# Patient Record
Sex: Female | Born: 1975 | Race: White | Hispanic: No | Smoking: Former smoker
Health system: Southern US, Community
[De-identification: ages and names within clinical notes are randomized; demographics above are authoritative.]

## PROBLEM LIST (undated history)

## (undated) DIAGNOSIS — R42 Dizziness and giddiness: Secondary | ICD-10-CM

## (undated) DIAGNOSIS — I639 Cerebral infarction, unspecified: Secondary | ICD-10-CM

## (undated) DIAGNOSIS — F32A Depression, unspecified: Secondary | ICD-10-CM

## (undated) DIAGNOSIS — R569 Unspecified convulsions: Secondary | ICD-10-CM

## (undated) DIAGNOSIS — C801 Malignant (primary) neoplasm, unspecified: Secondary | ICD-10-CM

## (undated) DIAGNOSIS — F419 Anxiety disorder, unspecified: Secondary | ICD-10-CM

## (undated) DIAGNOSIS — K8020 Calculus of gallbladder without cholecystitis without obstruction: Secondary | ICD-10-CM

## (undated) DIAGNOSIS — F329 Major depressive disorder, single episode, unspecified: Secondary | ICD-10-CM

## (undated) HISTORY — PX: ABDOMINAL HYSTERECTOMY: SUR658

---

## 2015-10-22 ENCOUNTER — Emergency Department (HOSPITAL_COMMUNITY): Payer: Medicaid Other

## 2015-10-22 ENCOUNTER — Encounter (HOSPITAL_COMMUNITY): Payer: Self-pay | Admitting: Emergency Medicine

## 2015-10-22 DIAGNOSIS — R1110 Vomiting, unspecified: Secondary | ICD-10-CM | POA: Diagnosis not present

## 2015-10-22 DIAGNOSIS — R1031 Right lower quadrant pain: Secondary | ICD-10-CM | POA: Diagnosis not present

## 2015-10-22 DIAGNOSIS — F17200 Nicotine dependence, unspecified, uncomplicated: Secondary | ICD-10-CM | POA: Diagnosis not present

## 2015-10-22 DIAGNOSIS — R0602 Shortness of breath: Secondary | ICD-10-CM | POA: Insufficient documentation

## 2015-10-22 DIAGNOSIS — R61 Generalized hyperhidrosis: Secondary | ICD-10-CM | POA: Insufficient documentation

## 2015-10-22 DIAGNOSIS — R079 Chest pain, unspecified: Secondary | ICD-10-CM | POA: Insufficient documentation

## 2015-10-22 LAB — URINALYSIS, ROUTINE W REFLEX MICROSCOPIC
Bilirubin Urine: NEGATIVE
Glucose, UA: NEGATIVE mg/dL
Hgb urine dipstick: NEGATIVE
Ketones, ur: NEGATIVE mg/dL
LEUKOCYTES UA: NEGATIVE
NITRITE: NEGATIVE
PROTEIN: NEGATIVE mg/dL
Specific Gravity, Urine: 1.026 (ref 1.005–1.030)
pH: 6.5 (ref 5.0–8.0)

## 2015-10-22 LAB — BASIC METABOLIC PANEL
ANION GAP: 13 (ref 5–15)
BUN: 10 mg/dL (ref 6–20)
CO2: 23 mmol/L (ref 22–32)
Calcium: 8.7 mg/dL — ABNORMAL LOW (ref 8.9–10.3)
Chloride: 94 mmol/L — ABNORMAL LOW (ref 101–111)
Creatinine, Ser: 0.69 mg/dL (ref 0.44–1.00)
Glucose, Bld: 102 mg/dL — ABNORMAL HIGH (ref 65–99)
Potassium: 3.6 mmol/L (ref 3.5–5.1)
Sodium: 130 mmol/L — ABNORMAL LOW (ref 135–145)

## 2015-10-22 LAB — I-STAT TROPONIN, ED: Troponin i, poc: 0 ng/mL (ref 0.00–0.08)

## 2015-10-22 LAB — CBC
HCT: 43 % (ref 36.0–46.0)
HEMOGLOBIN: 14.4 g/dL (ref 12.0–15.0)
MCH: 31.4 pg (ref 26.0–34.0)
MCHC: 33.5 g/dL (ref 30.0–36.0)
MCV: 93.7 fL (ref 78.0–100.0)
Platelets: 248 10*3/uL (ref 150–400)
RBC: 4.59 MIL/uL (ref 3.87–5.11)
RDW: 13.8 % (ref 11.5–15.5)
WBC: 10.8 10*3/uL — AB (ref 4.0–10.5)

## 2015-10-22 NOTE — ED Notes (Signed)
Pt. reports central chest pain with mild SOB , emesis and diaphoresis onset 3 days ago , pt. added right lower abdominal pain onset 2 days ago .

## 2015-10-23 ENCOUNTER — Emergency Department (HOSPITAL_COMMUNITY)
Admission: EM | Admit: 2015-10-23 | Discharge: 2015-10-23 | Disposition: A | Discharge status: Home / Self Care | Payer: Medicaid Other | Attending: Emergency Medicine | Admitting: Emergency Medicine

## 2015-10-23 HISTORY — DX: Malignant (primary) neoplasm, unspecified: C80.1

## 2017-03-09 ENCOUNTER — Emergency Department (HOSPITAL_COMMUNITY)
Admission: EM | Admit: 2017-03-09 | Discharge: 2017-03-09 | Disposition: A | Discharge status: Home / Self Care | Payer: Self-pay | Attending: Emergency Medicine | Admitting: Emergency Medicine

## 2017-03-09 ENCOUNTER — Emergency Department (HOSPITAL_COMMUNITY): Payer: Self-pay

## 2017-03-09 ENCOUNTER — Encounter (HOSPITAL_COMMUNITY): Payer: Self-pay

## 2017-03-09 DIAGNOSIS — K625 Hemorrhage of anus and rectum: Secondary | ICD-10-CM | POA: Insufficient documentation

## 2017-03-09 DIAGNOSIS — G44301 Post-traumatic headache, unspecified, intractable: Secondary | ICD-10-CM | POA: Insufficient documentation

## 2017-03-09 DIAGNOSIS — R1032 Left lower quadrant pain: Secondary | ICD-10-CM | POA: Insufficient documentation

## 2017-03-09 DIAGNOSIS — F0781 Postconcussional syndrome: Secondary | ICD-10-CM | POA: Insufficient documentation

## 2017-03-09 DIAGNOSIS — K649 Unspecified hemorrhoids: Secondary | ICD-10-CM | POA: Insufficient documentation

## 2017-03-09 DIAGNOSIS — Z79899 Other long term (current) drug therapy: Secondary | ICD-10-CM | POA: Insufficient documentation

## 2017-03-09 DIAGNOSIS — R112 Nausea with vomiting, unspecified: Secondary | ICD-10-CM | POA: Insufficient documentation

## 2017-03-09 HISTORY — DX: Dizziness and giddiness: R42

## 2017-03-09 HISTORY — DX: Unspecified convulsions: R56.9

## 2017-03-09 HISTORY — DX: Calculus of gallbladder without cholecystitis without obstruction: K80.20

## 2017-03-09 HISTORY — DX: Depression, unspecified: F32.A

## 2017-03-09 HISTORY — DX: Major depressive disorder, single episode, unspecified: F32.9

## 2017-03-09 LAB — COMPREHENSIVE METABOLIC PANEL
ALK PHOS: 68 U/L (ref 38–126)
ALT: 13 U/L — AB (ref 14–54)
AST: 16 U/L (ref 15–41)
Albumin: 3.6 g/dL (ref 3.5–5.0)
Anion gap: 8 (ref 5–15)
BILIRUBIN TOTAL: 0.1 mg/dL — AB (ref 0.3–1.2)
CHLORIDE: 105 mmol/L (ref 101–111)
CO2: 27 mmol/L (ref 22–32)
CREATININE: 0.75 mg/dL (ref 0.44–1.00)
Calcium: 8.9 mg/dL (ref 8.9–10.3)
GFR calc Af Amer: >60 mL/min (ref >60)
Glucose, Bld: 92 mg/dL (ref 65–99)
Potassium: 3.5 mmol/L (ref 3.5–5.1)
Sodium: 140 mmol/L (ref 135–145)
Total Protein: 5.9 g/dL — ABNORMAL LOW (ref 6.5–8.1)

## 2017-03-09 LAB — CBC WITH DIFFERENTIAL/PLATELET
BASOS ABS: 0 10*3/uL (ref 0.0–0.1)
Basophils Relative: 0 %
Eosinophils Absolute: 0.1 10*3/uL (ref 0.0–0.7)
Eosinophils Relative: 1 %
HEMATOCRIT: 39 % (ref 36.0–46.0)
HEMOGLOBIN: 13 g/dL (ref 12.0–15.0)
LYMPHS PCT: 22 %
Lymphs Abs: 1.9 10*3/uL (ref 0.7–4.0)
MCH: 32.7 pg (ref 26.0–34.0)
MCHC: 33.3 g/dL (ref 30.0–36.0)
MCV: 98 fL (ref 78.0–100.0)
Monocytes Absolute: 0.7 10*3/uL (ref 0.1–1.0)
Monocytes Relative: 8 %
NEUTROS ABS: 5.9 10*3/uL (ref 1.7–7.7)
NEUTROS PCT: 69 %
PLATELETS: 141 10*3/uL — AB (ref 150–400)
RBC: 3.98 MIL/uL (ref 3.87–5.11)
RDW: 13.5 % (ref 11.5–15.5)
WBC: 8.6 10*3/uL (ref 4.0–10.5)

## 2017-03-09 LAB — URINALYSIS, ROUTINE W REFLEX MICROSCOPIC
Bilirubin Urine: NEGATIVE
GLUCOSE, UA: NEGATIVE mg/dL
HGB URINE DIPSTICK: NEGATIVE
Ketones, ur: NEGATIVE mg/dL
Leukocytes, UA: NEGATIVE
Nitrite: NEGATIVE
PROTEIN: NEGATIVE mg/dL
Specific Gravity, Urine: 1.003 — ABNORMAL LOW (ref 1.005–1.030)
pH: 6 (ref 5.0–8.0)

## 2017-03-09 LAB — LIPASE, BLOOD: LIPASE: 22 U/L (ref 11–51)

## 2017-03-09 LAB — I-STAT BETA HCG BLOOD, ED (MC, WL, AP ONLY)

## 2017-03-09 MED ORDER — SODIUM CHLORIDE 0.9 % IV BOLUS (SEPSIS)
1000.0000 mL | Freq: Once | INTRAVENOUS | Status: AC
  Administered 2017-03-09: 1000 mL via INTRAVENOUS

## 2017-03-09 MED ORDER — PROMETHAZINE HCL 25 MG PO TABS: 25.0000 mg | ORAL_TABLET | Freq: Four times a day (QID) | ORAL | 0 refills | Status: DC | PRN

## 2017-03-09 MED ORDER — DICYCLOMINE HCL 20 MG PO TABS: 20.0000 mg | ORAL_TABLET | Freq: Two times a day (BID) | ORAL | 0 refills | Status: DC

## 2017-03-09 MED ORDER — DEXAMETHASONE SODIUM PHOSPHATE 10 MG/ML IJ SOLN
10.0000 mg | Freq: Once | INTRAMUSCULAR | Status: AC
  Administered 2017-03-09: 10 mg via INTRAVENOUS
  Filled 2017-03-09: qty 1

## 2017-03-09 MED ORDER — DIPHENHYDRAMINE HCL 50 MG/ML IJ SOLN
25.0000 mg | Freq: Once | INTRAMUSCULAR | Status: AC
  Administered 2017-03-09: 25 mg via INTRAVENOUS
  Filled 2017-03-09: qty 1

## 2017-03-09 MED ORDER — DICYCLOMINE HCL 10 MG/ML IM SOLN
20.0000 mg | Freq: Once | INTRAMUSCULAR | Status: AC
  Administered 2017-03-09: 20 mg via INTRAMUSCULAR
  Filled 2017-03-09: qty 2

## 2017-03-09 MED ORDER — ONDANSETRON HCL 4 MG/2ML IJ SOLN
4.0000 mg | Freq: Once | INTRAMUSCULAR | Status: AC
  Administered 2017-03-09: 4 mg via INTRAVENOUS
  Filled 2017-03-09: qty 2

## 2017-03-09 MED ORDER — MAGNESIUM SULFATE 2 GM/50ML IV SOLN
2.0000 g | Freq: Once | INTRAVENOUS | Status: AC
  Administered 2017-03-09: 2 g via INTRAVENOUS
  Filled 2017-03-09: qty 50

## 2017-03-09 MED ORDER — IOPAMIDOL (ISOVUE-300) INJECTION 61%
INTRAVENOUS | Status: AC
  Administered 2017-03-09: 100 mL
  Filled 2017-03-09: qty 100

## 2017-03-09 MED ORDER — IOPAMIDOL (ISOVUE-300) INJECTION 61%
INTRAVENOUS | Status: AC
  Administered 2017-03-09: 30 mL
  Filled 2017-03-09: qty 30

## 2017-03-09 MED ORDER — KETOROLAC TROMETHAMINE 15 MG/ML IJ SOLN
15.0000 mg | Freq: Once | INTRAMUSCULAR | Status: AC
  Administered 2017-03-09: 15 mg via INTRAVENOUS
  Filled 2017-03-09: qty 1

## 2017-03-09 MED ORDER — PROCHLORPERAZINE EDISYLATE 5 MG/ML IJ SOLN
10.0000 mg | Freq: Once | INTRAMUSCULAR | Status: AC
  Administered 2017-03-09: 10 mg via INTRAVENOUS
  Filled 2017-03-09: qty 2

## 2017-03-09 NOTE — ED Provider Notes (Signed)
[place] DEPT Provider Note   CSN: [id number] Arrival date & time: 03/09/17  1015     History   Chief Complaint Chief Complaint  Patient presents with  . Abdominal Pain    HPI Madeline Shaw is a 41 y.o. female.  HPI   Abdominal pain started on Wednesday, left lower quadrant and radiates to the back. Feels like "braxton hicks"  Reports urinary frequency every 10-15 minutes. Hematuria since Monday, no dysuria. Pain worse with eating and sitting up. Better in fetal position.  Over last few days has also started passing mucous blood clots in stool, had 3 episodes of it. had something similar 42mos ago but did not follow up with colonoscopy.  11lb weight loss over last week but also reports not eating.  Fell on Saturday, was seen at [facility], had CT done at [facility] which was negative. Reports since fall she has had headache, nausea, and vomiting.  Uses goody powders but has not in last week.   Past Medical History:  Diagnosis Date  . Cancer ([place])   . Depression   . Gall stones   . Seizures ([place])   . Vertigo     There are no active problems to display for this patient.   Past Surgical History:  Procedure Laterality Date  . ABDOMINAL HYSTERECTOMY      OB History    No data available       Home Medications    Prior to Admission medications   Medication Sig Start Date End Date Taking? Authorizing Provider  albuterol (PROVENTIL HFA;VENTOLIN HFA) 108 (90 Base) MCG/ACT inhaler Inhale 2 puffs into the lungs 2 (two) times daily.   Yes [provider]  albuterol (PROVENTIL) (2.5 MG/3ML) 0.083% nebulizer solution Take 2.5 mg by nebulization every 6 (six) hours as needed for wheezing or shortness of breath.   Yes [provider]  Aspirin-Acetaminophen-Caffeine (GOODY HEADACHE PO) Take 1 packet by mouth every hour as needed (pain/headache).   Yes [provider]  DEXAMETHASONE PO Take by mouth. Tapered course   Yes [provider]  diphenhydrAMINE (BENADRYL) 25 MG tablet Take 25 mg by mouth 3 (three) times daily as needed for itching or allergies.   Yes [provider]  estradiol (EVAMIST) 1.53 MG/SPRAY transdermal spray Place 1 spray onto the skin daily.   Yes [provider]  lamoTRIgine (LAMICTAL) 100 MG tablet Take 200 mg by mouth every 12 (twelve) hours.   Yes [provider]  LORazepam (ATIVAN) 1 MG tablet Take 1 mg by mouth every 6 (six) hours as needed for anxiety.  12/19/16  Yes [provider]  meclizine (ANTIVERT) 25 MG tablet Take 25 mg by mouth 3 (three) times daily as needed for dizziness.  12/19/16  Yes [provider]  ondansetron (ZOFRAN-ODT) 4 MG disintegrating tablet Take 4 mg by mouth every 8 (eight) hours as needed for nausea or vomiting.   Yes [provider]    Family History No family history on file.  Social History Social History  Substance Use Topics  . Smoking status: Current Every Day Smoker    Packs/day: 0.50    Types: Cigarettes  . Smokeless tobacco: Never Used  . Alcohol use Yes     Allergies   Imitrex [sumatriptan]; Chlorpheniramine-dm; and Triaminic fever reducer [acetaminophen]   Review of Systems Review of Systems  Constitutional: Positive for appetite change. Negative for fever.  HENT: Negative for sore throat.   Eyes: Negative for visual  disturbance.  Respiratory: Negative for cough and shortness of breath.   Cardiovascular: Negative for chest pain.  Gastrointestinal: Positive for abdominal pain, anal bleeding, blood in stool, nausea and vomiting. Negative for constipation (alterenating at baseline) and diarrhea.  Genitourinary: Positive for frequency and hematuria. Negative for difficulty urinating, vaginal bleeding and vaginal discharge.  Musculoskeletal: Negative for back pain and neck pain.  Skin: Negative for rash.  Neurological: Positive for headaches. Negative for syncope.     Physical Exam Updated  Vital Signs BP 112/79   Pulse 68   Temp 98.2 F (36.8 C) (Oral)   Resp 16   Ht 5\' 3"  (1.6 m)   Wt 53.1 kg (117 lb)   SpO2 98%   BMI 20.73 kg/m   Physical Exam  Constitutional: She is oriented to person, place, and time. She appears well-developed and well-nourished. No distress.  HENT:  Head: Normocephalic and atraumatic.  Eyes: Conjunctivae and EOM are normal.  Neck: Normal range of motion.  Cardiovascular: Normal rate, regular rhythm, normal heart sounds and intact distal pulses.  Exam reveals no gallop and no friction rub.   No murmur heard. Pulmonary/Chest: Effort normal and breath sounds normal. No respiratory distress. She has no wheezes. She has no rales.  Abdominal: Soft. She exhibits no distension. There is tenderness. There is guarding.  Musculoskeletal: She exhibits no edema or tenderness.  Neurological: She is alert and oriented to person, place, and time.  Skin: Skin is warm and dry. No rash noted. She is not diaphoretic. No erythema.  Nursing note and vitals reviewed.    ED Treatments / Results  Labs (all labs ordered are listed, but only abnormal results are displayed) Labs Reviewed  CBC WITH DIFFERENTIAL/PLATELET - Abnormal; Notable for the following:       Result Value   Platelets 141 (*)    All other components within normal limits  COMPREHENSIVE METABOLIC PANEL - Abnormal; Notable for the following:    BUN <5 (*)    Total Protein 5.9 (*)    ALT 13 (*)    Total Bilirubin 0.1 (*)    All other components within normal limits  URINALYSIS, ROUTINE W REFLEX MICROSCOPIC - Abnormal; Notable for the following:    Color, Urine STRAW (*)    Specific Gravity, Urine 1.003 (*)    All other components within normal limits  URINE CULTURE  LIPASE, BLOOD  I-STAT BETA HCG BLOOD, ED ([facility], [facility], [facility] ONLY)    EKG  EKG Interpretation None       Radiology Ct Abdomen Pelvis W Contrast  Result Date: 03/09/2017 CLINICAL DATA:  Abdominal pain with gastrointestinal  bleeding. Vomiting and nausea for 3 days. EXAM: CT ABDOMEN AND PELVIS WITH CONTRAST TECHNIQUE: Multidetector CT imaging of the abdomen and pelvis was performed using the standard protocol following bolus administration of intravenous contrast. CONTRAST:  134mL ISOVUE-300 IOPAMIDOL (ISOVUE-300) INJECTION 61% COMPARISON:  None. FINDINGS: Lower chest: 0.6 by 0.5 by 0.5 cm subpleural nodule in the right lower lobe on image 7/4. Dependent subsegmental atelectasis present in both lower lobes. Hepatobiliary: 0.9 by 0.5 by 0.6 cm hypodense lesion in the right hepatic lobe on image 13/3. 4 mm segment IV a hypodense lesion on image 10/3. Contracted gallbladder. Pancreas: Unremarkable Spleen: Subtle linear hypodensity along the posterior spleen is likely due to a cleft given the lack of appreciable perisplenic fluid. Adrenals/Urinary Tract: Unremarkable Stomach/Bowel: No obvious gastric or duodenal abnormality on CT. Appendix normal. Vascular/Lymphatic: Aortoiliac atherosclerotic vascular disease. No pathologic adenopathy identified.  Reproductive: Appears absent. Left ovary unremarkable. Right ovary poorly seen. Other: Small amount of free pelvic fluid slightly eccentric to the right in the cul-de-sac region. Musculoskeletal: Unremarkable IMPRESSION: 1. A specific cause for the patient's abdominal pain and gastrointestinal bleeding is not identified. 2. Several small nonspecific hepatic lesions are observed as detailed above. Although statistically likely to be benign these are technically nonspecific due to small size. 3.  Aortic Atherosclerosis (ICD10-I70.0). 4. Trace amount of free pelvic fluid eccentric to the right in the cul-de-sac region, significance uncertain. 5. Surgical absence of the uterus. Nonvisualization of the right ovary. 6. The appendix appears normal. Electronically Signed   By: Van Clines M.D.   On: 03/09/2017 15:58    Procedures Procedures (including critical care time)  Medications Ordered  in ED Medications  sodium chloride 0.9 % bolus 1,000 mL (0 mLs Intravenous Stopped 03/09/17 1556)  prochlorperazine (COMPAZINE) injection 10 mg (10 mg Intravenous Given 03/09/17 1233)  diphenhydrAMINE (BENADRYL) injection 25 mg (25 mg Intravenous Given 03/09/17 1231)  dicyclomine (BENTYL) injection 20 mg (20 mg Intramuscular Given 03/09/17 1238)  dexamethasone (DECADRON) injection 10 mg (10 mg Intravenous Given 03/09/17 1234)  iopamidol (ISOVUE-300) 61 % injection (30 mLs  Contrast Given 03/09/17 1244)  sodium chloride 0.9 % bolus 1,000 mL (0 mLs Intravenous Stopped 03/09/17 1557)  ondansetron (ZOFRAN) injection 4 mg (4 mg Intravenous Given 03/09/17 1440)  iopamidol (ISOVUE-300) 61 % injection (100 mLs  Contrast Given 03/09/17 1457)  magnesium sulfate IVPB 2 g 50 mL (2 g Intravenous New Bag/Given 03/09/17 1718)  ketorolac (TORADOL) 15 MG/ML injection 15 mg (15 mg Intravenous Given 03/09/17 1716)     Initial Impression / Assessment and Plan / ED Course  I have reviewed the triage vital signs and the nursing notes.  Pertinent labs & imaging results that were available during my care of the patient were reviewed by me and considered in my medical decision making (see chart for details).     41 year old female with a history of vertigo, seizures, bipolar, cervical cancer presents with concern for continuing headache, nausea, vomiting, abdominal pain radiating towards the back, movements of blood and mucus.   Regarding headache, patient reports going from sitting to standing,having syncopal episode, falling and hitting her head on doorframe and presenting to [facility] on 7/2, then [facility] on 73 with headache, and having a head CT which was negative. Syncopal episodes was likely orthostatic in nature, with headache developing after likely post-concussive.  Given she does not have history of sudden onset HA prior to episode, had trauma, feel concussion is most likely etiology of headache. No signs  of SAH, meningitis, and doubt delayed SDH in young patient without hx of anticoagulant use.  Given headache cocktail including compazine, benadryl, decadron. Headache continuing and pt given toradol and Mg.  Regarding abdominal pain, CT done shows no evidence of acute abnormalities. Pt with hx of hysterectomy, oophorectomy. No sign of UTI.  Regarding blood and mucus per rectum, hgb is stable. No episodes in ED. Doubt clinically significant GI bleed.  Pt with hemorrhoids on exam and pain with rectal exam and feel hemorrhoids and anal fissure likely etiologies of symptoms. No sign of melena or hematochezia on exam.  Blood pressures after medications measuring lower however patient with arm up covering eyes for much of readings and feel they are likely positional. BP stable in 100s with appropriate positioning. Patient tolerating po in the ED.  Discussed results with patient in detail. Recommend sitz  baths, GI follow up. Placed request to Case Management to help find PCP and GI. Given rx for phenergan and bentyl. Patient discharged in stable condition with understanding of reasons to return.   Final Clinical Impressions(s) / ED Diagnoses   Final diagnoses:  Post concussive syndrome  Left lower quadrant pain  Nausea and vomiting, intractability of vomiting not specified, unspecified vomiting type  Hemorrhoids, unspecified hemorrhoid type  Rectal bleeding    New Prescriptions New Prescriptions   No medications on file     Gareth Morgan, MD 03/09/17 1751

## 2017-03-09 NOTE — ED Notes (Signed)
MD made aware of patient's BP  

## 2017-03-09 NOTE — ED Notes (Signed)
Pt returned from X-ray.  

## 2017-03-09 NOTE — ED Notes (Signed)
Called CT to retrieve patient

## 2017-03-09 NOTE — ED Notes (Signed)
MD Schlossman at the bedside performing rectal exam.

## 2017-03-09 NOTE — ED Triage Notes (Addendum)
Per Pt, Pt is coming from home with complaints of abdominal pain, GI bleeding, Vomiting, and nausea for three days. Reports vomiting a black substance.  Pt reports that she fell due to vertigo and hit her head on Saturday where she blacked out. Pt was seen at [facility] and diagnosed with closed head injury. Pt was sent home.   Pt has hx of seizures and vertigo. Repots having blood noted in her urine during hospital visit.

## 2017-03-09 NOTE — ED Notes (Signed)
MD Schlossman at the bedside  

## 2017-03-09 NOTE — ED Notes (Signed)
Pt taken to CT.

## 2017-03-10 ENCOUNTER — Telehealth: Payer: Self-pay | Admitting: *Deleted

## 2017-03-10 LAB — URINE CULTURE: Culture: NO GROWTH

## 2017-03-10 NOTE — Telephone Encounter (Signed)
[facility] consulted regarding helping pt find PCP.  Pt has Medicaid with PCP listed on card of which she has to change with DSS.  EDCM left voicemail for pt to call back so I can deliver these instructions to her.

## 2018-03-04 DIAGNOSIS — F449 Dissociative and conversion disorder, unspecified: Secondary | ICD-10-CM

## 2018-04-27 ENCOUNTER — Emergency Department (HOSPITAL_COMMUNITY): Payer: Self-pay

## 2018-04-27 ENCOUNTER — Other Ambulatory Visit: Payer: Self-pay

## 2018-04-27 ENCOUNTER — Emergency Department (HOSPITAL_COMMUNITY)
Admission: EM | Admit: 2018-04-27 | Discharge: 2018-04-27 | Disposition: A | Discharge status: Home / Self Care | Payer: Self-pay | Attending: Emergency Medicine | Admitting: Emergency Medicine

## 2018-04-27 ENCOUNTER — Encounter (HOSPITAL_COMMUNITY): Payer: Self-pay | Admitting: Emergency Medicine

## 2018-04-27 DIAGNOSIS — R1030 Lower abdominal pain, unspecified: Secondary | ICD-10-CM | POA: Insufficient documentation

## 2018-04-27 DIAGNOSIS — F17210 Nicotine dependence, cigarettes, uncomplicated: Secondary | ICD-10-CM | POA: Insufficient documentation

## 2018-04-27 DIAGNOSIS — Z859 Personal history of malignant neoplasm, unspecified: Secondary | ICD-10-CM | POA: Insufficient documentation

## 2018-04-27 DIAGNOSIS — R197 Diarrhea, unspecified: Secondary | ICD-10-CM | POA: Insufficient documentation

## 2018-04-27 DIAGNOSIS — R102 Pelvic and perineal pain: Secondary | ICD-10-CM | POA: Insufficient documentation

## 2018-04-27 DIAGNOSIS — Z79899 Other long term (current) drug therapy: Secondary | ICD-10-CM | POA: Insufficient documentation

## 2018-04-27 DIAGNOSIS — J189 Pneumonia, unspecified organism: Secondary | ICD-10-CM

## 2018-04-27 DIAGNOSIS — R112 Nausea with vomiting, unspecified: Secondary | ICD-10-CM | POA: Insufficient documentation

## 2018-04-27 LAB — CBC
HEMATOCRIT: 43.7 % (ref 36.0–46.0)
HEMOGLOBIN: 14.5 g/dL (ref 12.0–15.0)
MCH: 31.3 pg (ref 26.0–34.0)
MCHC: 33.2 g/dL (ref 30.0–36.0)
MCV: 94.4 fL (ref 78.0–100.0)
Platelets: 257 10*3/uL (ref 150–400)
RBC: 4.63 MIL/uL (ref 3.87–5.11)
RDW: 13.6 % (ref 11.5–15.5)
WBC: 6.9 10*3/uL (ref 4.0–10.5)

## 2018-04-27 LAB — CBG MONITORING, ED
GLUCOSE-CAPILLARY: 79 mg/dL (ref 70–99)
GLUCOSE-CAPILLARY: 81 mg/dL (ref 70–99)

## 2018-04-27 LAB — I-STAT BETA HCG BLOOD, ED (MC, WL, AP ONLY): I-stat hCG, quantitative: <5 m[IU]/mL (ref <5)

## 2018-04-27 LAB — URINALYSIS, ROUTINE W REFLEX MICROSCOPIC
Bilirubin Urine: NEGATIVE
GLUCOSE, UA: NEGATIVE mg/dL
Hgb urine dipstick: NEGATIVE
Ketones, ur: NEGATIVE mg/dL
LEUKOCYTES UA: NEGATIVE
NITRITE: NEGATIVE
PH: 7 (ref 5.0–8.0)
Protein, ur: NEGATIVE mg/dL
SPECIFIC GRAVITY, URINE: 1.004 — AB (ref 1.005–1.030)

## 2018-04-27 LAB — COMPREHENSIVE METABOLIC PANEL
ALBUMIN: 3.7 g/dL (ref 3.5–5.0)
ALT: 9 U/L (ref 0–44)
ANION GAP: 12 (ref 5–15)
AST: 15 U/L (ref 15–41)
Alkaline Phosphatase: 136 U/L — ABNORMAL HIGH (ref 38–126)
BUN: 6 mg/dL (ref 6–20)
CHLORIDE: 98 mmol/L (ref 98–111)
CO2: 30 mmol/L (ref 22–32)
Calcium: 9.5 mg/dL (ref 8.9–10.3)
Creatinine, Ser: 0.79 mg/dL (ref 0.44–1.00)
GFR calc Af Amer: >60 mL/min (ref >60)
Glucose, Bld: 57 mg/dL — ABNORMAL LOW (ref 70–99)
POTASSIUM: 3.9 mmol/L (ref 3.5–5.1)
Sodium: 140 mmol/L (ref 135–145)
Total Bilirubin: 0.5 mg/dL (ref 0.3–1.2)
Total Protein: 6.9 g/dL (ref 6.5–8.1)

## 2018-04-27 LAB — LIPASE, BLOOD: LIPASE: 27 U/L (ref 11–51)

## 2018-04-27 MED ORDER — PROMETHAZINE HCL 25 MG/ML IJ SOLN
12.5000 mg | Freq: Once | INTRAMUSCULAR | Status: AC
  Administered 2018-04-27: 12.5 mg via INTRAVENOUS
  Filled 2018-04-27: qty 1

## 2018-04-27 MED ORDER — IBUPROFEN 800 MG PO TABS
800.0000 mg | ORAL_TABLET | Freq: Once | ORAL | Status: AC
  Administered 2018-04-27: 800 mg via ORAL
  Filled 2018-04-27: qty 1

## 2018-04-27 MED ORDER — ALBUTEROL SULFATE HFA 108 (90 BASE) MCG/ACT IN AERS
2.0000 | INHALATION_SPRAY | Freq: Once | RESPIRATORY_TRACT | Status: AC
  Administered 2018-04-27: 2 via RESPIRATORY_TRACT
  Filled 2018-04-27: qty 6.7

## 2018-04-27 MED ORDER — METOCLOPRAMIDE HCL 5 MG/ML IJ SOLN
10.0000 mg | Freq: Once | INTRAMUSCULAR | Status: DC
  Filled 2018-04-27: qty 2

## 2018-04-27 NOTE — ED Notes (Signed)
Pt departed in NAD.  

## 2018-04-27 NOTE — ED Triage Notes (Signed)
Pt c/o abdominal pain, diarrhea, and blood in her stool. Pt states that she was recently taken off her klonopin and started on ativan, pt states she has been out of her ativan since Thursday. Reports a seizure this am, hx of same, A&O x 4.

## 2018-04-27 NOTE — ED Notes (Signed)
Pt requesting something more for her headache.

## 2018-04-27 NOTE — ED Provider Notes (Signed)
Patient placed in Quick Look pathway, seen and evaluated   Chief Complaint: Abdominal Pain, nausea, vomiting   HPI:   42 y.o. female who presents for evaluation of lower abdominal pain, nausea, vomiting.  She reports abdominal pain is been ongoing for the last 2 weeks.  She reports for the last 5 days, she has had nausea, vomiting, diarrhea.  She states that diarrhea has had a spots of bright red blood and she has noticed bright red blood on the toilet paper when she wipes.  No dark stools or melena.  Patient also reports that when she does vomit, she has had yellow phlegm with blood-tinged in the emesis.  No gross this.  She repeats that she has not been able to eat or drink much secondary to her symptoms.  Patient also reports that she has been out of her Ativan for the last 5 days.  She reports that normally she takes 4 mg of Ativan a day.  Patient denies any fevers, chest pain, difficulty breathing.  ROS: Abdominal pain, nausea/vomiting, diarrhea.  Physical Exam:   Gen: No distress  Neuro: Awake and Alert  Skin: Warm    Focused Exam: Abdomen is soft, nondistended.  No rigidity, guarding.  Diffuse tenderness palpation of the lower abdomen.  No CVA tenderness bilaterally. RRR. CTAB.   Initiation of care has begun. The patient has been counseled on the process, plan, and necessity for staying for the completion/evaluation, and the remainder of the medical screening examination    Volanda Napoleon, PA-C 04/27/18 1625    Malvin Johns, MD 04/27/18 1919

## 2018-04-27 NOTE — ED Notes (Signed)
Patient requesting phenergan instead of reglan. MD notified.

## 2018-04-27 NOTE — ED Provider Notes (Addendum)
Pt is a 42yo with multiple complaints.  She reports vomiting and diarrhea.  She also reports vision changes.  She has a chronic cough.  No reported fevers.  She is neurologically intact on exam.  She does not have any significant abdominal pain.  She has had a recent hospitalizations with thorough evaluations including MRI of the brain.  It was felt that she potentially has conversion disorder.  She has had recent imaging of her abdomen and gallbladder which was reported to be negative.  Her alk phos is mildly elevated but her other LFTs are normal.  Her white count is normal.  Her other labs are non-concerning.  She is stable for discharge with outpatient follow-up.  I saw and evaluated the patient, reviewed the resident's note and I agree with the findings and plan.  EKG: None     Malvin Johns, MD 04/27/18 2411    Malvin Johns, MD 04/27/18 2304

## 2018-04-27 NOTE — Discharge Instructions (Signed)
Please call to schedule an appointment with your primary care provider as soon as possible. Please return to the ED if you experience worsening symptoms.

## 2018-04-27 NOTE — ED Provider Notes (Signed)
[place] EMERGENCY DEPARTMENT Provider Note   CSN: [id number] Arrival date & time: 04/27/18  1535     History   Chief Complaint Chief Complaint  Patient presents with  . Abdominal Pain  . Diarrhea    HPI Madeline Shaw is a 42 y.o. female.  The history is provided by the patient and a relative.  Abdominal Pain   This is a new problem. The current episode started more than 2 days ago. The problem occurs constantly. The problem has not changed since onset.The pain is associated with an unknown factor. The pain is located in the generalized abdominal region. The pain is mild. Associated symptoms include diarrhea, vomiting and headaches. Pertinent negatives include fever and dysuria. Nothing aggravates the symptoms. Nothing relieves the symptoms.    Past Medical History:  Diagnosis Date  . Cancer ([place])   . Depression   . Gall stones   . Seizures ([place])   . Vertigo     There are no active problems to display for this patient.   Past Surgical History:  Procedure Laterality Date  . ABDOMINAL HYSTERECTOMY       OB History   None      Home Medications    Prior to Admission medications   Medication Sig Start Date End Date Taking? Authorizing Provider  albuterol (PROVENTIL HFA;VENTOLIN HFA) 108 (90 Base) MCG/ACT inhaler Inhale 2 puffs into the lungs 2 (two) times daily.    [provider]  albuterol (PROVENTIL) (2.5 MG/3ML) 0.083% nebulizer solution Take 2.5 mg by nebulization every 6 (six) hours as needed for wheezing or shortness of breath.    [provider]  Aspirin-Acetaminophen-Caffeine (GOODY HEADACHE PO) Take 1 packet by mouth every hour as needed (pain/headache).    [provider]  DEXAMETHASONE PO Take by mouth. Tapered course    [provider]  dicyclomine (BENTYL) 20 MG tablet Take 1 tablet (20 mg total) by mouth 2 (two) times daily. 03/09/17   Gareth Morgan, MD  diphenhydrAMINE (BENADRYL) 25 MG  tablet Take 25 mg by mouth 3 (three) times daily as needed for itching or allergies.    [provider]  estradiol (EVAMIST) 1.53 MG/SPRAY transdermal spray Place 1 spray onto the skin daily.    [provider]  lamoTRIgine (LAMICTAL) 100 MG tablet Take 200 mg by mouth every 12 (twelve) hours.    [provider]  LORazepam (ATIVAN) 1 MG tablet Take 1 mg by mouth every 6 (six) hours as needed for anxiety.  12/19/16   [provider]  meclizine (ANTIVERT) 25 MG tablet Take 25 mg by mouth 3 (three) times daily as needed for dizziness.  12/19/16   [provider]  ondansetron (ZOFRAN-ODT) 4 MG disintegrating tablet Take 4 mg by mouth every 8 (eight) hours as needed for nausea or vomiting.    [provider]  promethazine (PHENERGAN) 25 MG tablet Take 1 tablet (25 mg total) by mouth every 6 (six) hours as needed for nausea or vomiting. 03/09/17   Gareth Morgan, MD    Family History No family history on file.  Social History Social History   Tobacco Use  . Smoking status: Current Every Day Smoker    Packs/day: 0.50    Types: Cigarettes  . Smokeless tobacco: Never Used  Substance Use Topics  . Alcohol use: Yes  . Drug use: No     Allergies   Imitrex [sumatriptan]; Chlorpheniramine-dm; and Triaminic fever reducer [acetaminophen]   Review of  Systems Review of Systems  Constitutional: Positive for chills. Negative for fever.  HENT: Positive for congestion.   Eyes: Positive for visual disturbance ("seeing triple").  Respiratory: Positive for cough and shortness of breath.   Cardiovascular: Negative for chest pain.  Gastrointestinal: Positive for abdominal pain, diarrhea and vomiting.  Genitourinary: Negative for decreased urine volume, dysuria and flank pain.  Musculoskeletal: Negative for back pain.  Skin: Positive for rash (left forearm).  Neurological: Positive for headaches.     Physical Exam Updated Vital Signs BP (!) 96/57    Pulse 69   Temp 98 F (36.7 C) (Oral)   Resp 15   SpO2 99%   Physical Exam  Constitutional: She appears well-developed and well-nourished. No distress.  HENT:  Head: Normocephalic and atraumatic.  Eyes: Conjunctivae are normal. No scleral icterus.  Neck: Neck supple.  Cardiovascular: Normal rate and regular rhythm.  No murmur heard. Pulmonary/Chest: Effort normal and breath sounds normal. No respiratory distress.  Abdominal: Soft. Bowel sounds are normal. She exhibits no distension and no ascites. There is no tenderness. There is no rigidity, no rebound and no guarding.  Musculoskeletal: She exhibits no edema.  No lower extremity eema  Neurological: She is alert. She has normal strength. No sensory deficit.  Skin: Skin is warm and dry. Capillary refill takes less than 2 seconds.  Abrasion seen over left forearm  Psychiatric: Her mood appears anxious. She is not agitated.  Nursing note and vitals reviewed.    ED Treatments / Results  Labs (all labs ordered are listed, but only abnormal results are displayed) Labs Reviewed  COMPREHENSIVE METABOLIC PANEL - Abnormal; Notable for the following components:      Result Value   Glucose, Bld 57 (*)    Alkaline Phosphatase 136 (*)    All other components within normal limits  URINALYSIS, ROUTINE W REFLEX MICROSCOPIC - Abnormal; Notable for the following components:   Color, Urine STRAW (*)    Specific Gravity, Urine 1.004 (*)    All other components within normal limits  LIPASE, BLOOD  CBC  ROCKY MTN SPOTTED FVR ABS PNL(IGG+IGM)  I-STAT BETA HCG BLOOD, ED ([facility], [facility], [facility] ONLY)  CBG MONITORING, ED  CBG MONITORING, ED    EKG None  Radiology Dg Chest 2 View  Result Date: 04/27/2018 CLINICAL DATA:  Pneumonia.  Lower abdominal pain.  Nausea, vomiting. EXAM: CHEST - 2 VIEW COMPARISON:  Chest x-ray 10/22/2015 FINDINGS: Heart and mediastinal contours are within normal limits. No focal opacities or effusions. No acute bony  abnormality. IMPRESSION: No active cardiopulmonary disease. Electronically Signed   By: Rolm Baptise M.D.   On: 04/27/2018 20:56    Procedures Procedures (including critical care time)  Medications Ordered in ED Medications  ibuprofen (ADVIL,MOTRIN) tablet 800 mg (800 mg Oral Given 04/27/18 1927)  promethazine (PHENERGAN) injection 12.5 mg (12.5 mg Intravenous Given 04/27/18 1925)  albuterol (PROVENTIL HFA;VENTOLIN HFA) 108 (90 Base) MCG/ACT inhaler 2 puff (2 puffs Inhalation Given 04/27/18 1932)     Initial Impression / Assessment and Plan / ED Course  I have reviewed the triage vital signs and the nursing notes.  Pertinent labs & imaging results that were available during my care of the patient were reviewed by me and considered in my medical decision making (see chart for details).     The patient is a 42 year old female with past medical history of multiple concussions and anxiety who presents with multiple complaints.  The patient's primary complaint is 3 days of nausea,  vomiting, and diarrhea.  The patient reports that she began to be able to tolerate p.o. again today, but still is not eating as much as she usually does.  The patient also complains of triple vision, headaches, and fatigue.  Patient has previously been evaluated for her visual complaints and headaches and review of her primary care provider's note reveals a report of a normal MRI.  CBC reveals no findings concerning for infection, anemia, pancytopenia.  CMP reveals slight elevation in LFTs similar to prior, no significant electrolyte abnormalities, or evidence of renal dysfunction.  Patient was found to have hypoglycemia and was able to tolerate p.o. juice.  Repeat point-of-care glucose reveals normal blood sugar after patient was given juice.  Patient reports that her headache has improved, however she continues to have these persistent visual changes.  The patient reports that she ran out of her Ativan and needs another  prescription. Patient given return precautions regarding her abdominal pain.  Instructed patient to follow-up with her primary care provider tomorrow regarding her Ativan prescription and to follow-up after her emergency department visit. Answered questions to patient's satisfaction and patient reports good understanding of plan.  Care supervised by Dr. Tamera Punt.  Final Clinical Impressions(s) / ED Diagnoses   Final diagnoses:  Nausea vomiting and diarrhea    ED Discharge Orders    None       Irven Baltimore, MD 04/28/18 1655    Malvin Johns, MD 05/05/18 [id number]

## 2018-04-28 ENCOUNTER — Ambulatory Visit (HOSPITAL_COMMUNITY)
Admission: RE | Admit: 2018-04-28 | Discharge: 2018-04-28 | Disposition: A | Discharge status: Home / Self Care | Payer: Federal, State, Local not specified - Other | Attending: Psychiatry | Admitting: Psychiatry

## 2018-04-28 ENCOUNTER — Emergency Department (HOSPITAL_COMMUNITY)
Admission: EM | Admit: 2018-04-28 | Discharge: 2018-04-29 | Disposition: A | Discharge status: Other Acute Inpatient Hospital | Payer: Federal, State, Local not specified - Other | Attending: Emergency Medicine | Admitting: Emergency Medicine

## 2018-04-28 ENCOUNTER — Encounter (HOSPITAL_COMMUNITY): Payer: Self-pay | Admitting: Emergency Medicine

## 2018-04-28 DIAGNOSIS — Z859 Personal history of malignant neoplasm, unspecified: Secondary | ICD-10-CM | POA: Insufficient documentation

## 2018-04-28 DIAGNOSIS — F329 Major depressive disorder, single episode, unspecified: Secondary | ICD-10-CM | POA: Insufficient documentation

## 2018-04-28 DIAGNOSIS — Z1330 Encounter for screening examination for mental health and behavioral disorders, unspecified: Secondary | ICD-10-CM | POA: Insufficient documentation

## 2018-04-28 DIAGNOSIS — F1320 Sedative, hypnotic or anxiolytic dependence, uncomplicated: Secondary | ICD-10-CM

## 2018-04-28 DIAGNOSIS — F1310 Sedative, hypnotic or anxiolytic abuse, uncomplicated: Secondary | ICD-10-CM | POA: Insufficient documentation

## 2018-04-28 DIAGNOSIS — F17210 Nicotine dependence, cigarettes, uncomplicated: Secondary | ICD-10-CM | POA: Insufficient documentation

## 2018-04-28 DIAGNOSIS — R45851 Suicidal ideations: Secondary | ICD-10-CM | POA: Insufficient documentation

## 2018-04-28 DIAGNOSIS — Z79899 Other long term (current) drug therapy: Secondary | ICD-10-CM | POA: Insufficient documentation

## 2018-04-28 LAB — COMPREHENSIVE METABOLIC PANEL
ALT: 8 U/L (ref 0–44)
ANION GAP: 13 (ref 5–15)
AST: 14 U/L — ABNORMAL LOW (ref 15–41)
Albumin: 3.7 g/dL (ref 3.5–5.0)
Alkaline Phosphatase: 126 U/L (ref 38–126)
BILIRUBIN TOTAL: 0.4 mg/dL (ref 0.3–1.2)
BUN: 7 mg/dL (ref 6–20)
CHLORIDE: 100 mmol/L (ref 98–111)
CO2: 25 mmol/L (ref 22–32)
Calcium: 9.3 mg/dL (ref 8.9–10.3)
Creatinine, Ser: 0.72 mg/dL (ref 0.44–1.00)
Glucose, Bld: 88 mg/dL (ref 70–99)
POTASSIUM: 3.9 mmol/L (ref 3.5–5.1)
Sodium: 138 mmol/L (ref 135–145)
TOTAL PROTEIN: 6.9 g/dL (ref 6.5–8.1)

## 2018-04-28 LAB — CBC
HEMATOCRIT: 42.6 % (ref 36.0–46.0)
HEMOGLOBIN: 14.5 g/dL (ref 12.0–15.0)
MCH: 32 pg (ref 26.0–34.0)
MCHC: 34 g/dL (ref 30.0–36.0)
MCV: 94 fL (ref 78.0–100.0)
Platelets: 244 10*3/uL (ref 150–400)
RBC: 4.53 MIL/uL (ref 3.87–5.11)
RDW: 14.2 % (ref 11.5–15.5)
WBC: 5.4 10*3/uL (ref 4.0–10.5)

## 2018-04-28 LAB — RAPID URINE DRUG SCREEN, HOSP PERFORMED
AMPHETAMINES: NOT DETECTED
BARBITURATES: NOT DETECTED
Benzodiazepines: POSITIVE — AB
Cocaine: NOT DETECTED
OPIATES: NOT DETECTED
TETRAHYDROCANNABINOL: NOT DETECTED

## 2018-04-28 LAB — ETHANOL

## 2018-04-28 LAB — I-STAT BETA HCG BLOOD, ED (MC, WL, AP ONLY): I-stat hCG, quantitative: <5 m[IU]/mL (ref <5)

## 2018-04-28 MED ORDER — POTASSIUM CHLORIDE CRYS ER 20 MEQ PO TBCR
20.0000 meq | EXTENDED_RELEASE_TABLET | Freq: Every day | ORAL | Status: DC
  Administered 2018-04-28 – 2018-04-29 (×2): 20 meq via ORAL
  Filled 2018-04-28 (×2): qty 1

## 2018-04-28 MED ORDER — LAMOTRIGINE 100 MG PO TABS
200.0000 mg | ORAL_TABLET | Freq: Two times a day (BID) | ORAL | Status: DC
  Administered 2018-04-28 – 2018-04-29 (×2): 200 mg via ORAL
  Filled 2018-04-28 (×2): qty 2

## 2018-04-28 MED ORDER — ALPRAZOLAM 0.5 MG PO TABS: 0.5000 mg | ORAL_TABLET | Freq: Every day | ORAL | Status: DC | PRN

## 2018-04-28 MED ORDER — LORAZEPAM 1 MG PO TABS
1.0000 mg | ORAL_TABLET | Freq: Four times a day (QID) | ORAL | Status: DC | PRN
  Administered 2018-04-29 (×2): 1 mg via ORAL
  Filled 2018-04-28 (×2): qty 1

## 2018-04-28 MED ORDER — MECLIZINE HCL 25 MG PO TABS: 25.0000 mg | ORAL_TABLET | Freq: Three times a day (TID) | ORAL | Status: DC | PRN

## 2018-04-28 MED ORDER — FUROSEMIDE 20 MG PO TABS
20.0000 mg | ORAL_TABLET | Freq: Every day | ORAL | Status: DC
  Administered 2018-04-28 – 2018-04-29 (×2): 20 mg via ORAL
  Filled 2018-04-28 (×2): qty 1

## 2018-04-28 MED ORDER — LORAZEPAM 1 MG PO TABS
2.0000 mg | ORAL_TABLET | Freq: Once | ORAL | Status: AC
  Administered 2018-04-28: 2 mg via ORAL
  Filled 2018-04-28: qty 2

## 2018-04-28 MED ORDER — ALBUTEROL SULFATE HFA 108 (90 BASE) MCG/ACT IN AERS
2.0000 | INHALATION_SPRAY | Freq: Two times a day (BID) | RESPIRATORY_TRACT | Status: DC
  Administered 2018-04-28 – 2018-04-29 (×2): 2 via RESPIRATORY_TRACT
  Filled 2018-04-28: qty 6.7

## 2018-04-28 NOTE — ED Notes (Signed)
Bed: W[facility]37 Expected date:  Expected time:  Means of arrival:  Comments: 

## 2018-04-28 NOTE — ED Triage Notes (Addendum)
Patient is a walk in at [facility] states she is withdrawing from Benzo's-doesn't know why she is here-last use was yesterday

## 2018-04-28 NOTE — ED Notes (Signed)
Bed: WBH41 Expected date:  Expected time:  Means of arrival:  Comments: 

## 2018-04-28 NOTE — H&P (Signed)
University Screening Exam  Madeline Shaw is an 42 y.o. female patient presents to [facility] as walking; brought in by a friend;  Patient states that she ran out of her Ativan early and states that she is withdrawing because she doesn't have anymore medication and her primary doctor wouldn't give her any told her to go to the hospital.  Patient denies paranoia; but endorses suicidal/homicidal ideation and psychosis.  Patient also states that she has a history of seizures.    Total Time spent with patient: 45 minutes  Psychiatric Specialty Exam: Physical Exam  Vitals reviewed. Constitutional: She is oriented to person, place, and time. She appears well-nourished.  Neck: Normal range of motion. Neck supple.  Respiratory: Effort normal.  Musculoskeletal: Normal range of motion.  Neurological: She is alert and oriented to person, place, and time.  Skin: Skin is warm and dry.  Psychiatric: Her speech is normal. Her mood appears anxious. She is actively hallucinating. Cognition and memory are normal. She expresses impulsivity. She expresses homicidal and suicidal ideation.    Review of Systems  Gastrointestinal: Positive for abdominal pain and nausea.  Neurological: Positive for seizures.  Psychiatric/Behavioral: Positive for depression and suicidal ideas. Substance abuse: Over taking her BENZO medication; states that she is dependen.  All other systems reviewed and are negative.   Blood pressure 135/85, pulse 91, temperature 98 F (36.7 C), resp. rate 18, SpO2 100 %.There is no height or weight on file to calculate BMI.  General Appearance: Casual  Eye Contact:  Good  Speech:  Clear and Coherent and Normal Rate  Volume:  Normal  Mood:  Anxious, Depressed and Irritable  Affect:  Depressed  Thought Process:  Coherent  Orientation:  Full (Time, Place, and Person)  Thought Content:  Hallucinations: Auditory  Suicidal Thoughts:  Yes.  with intent/plan  Homicidal Thoughts:   Yes.  without intent/plan  Memory:  Immediate;   Good Recent;   Good Remote;   Good  Judgement:  Fair  Insight:  Lacking  Psychomotor Activity:  Normal  Concentration: Concentration: Good and Attention Span: Good  Recall:  Good  Fund of Knowledge:Fair  Language: Good  Akathisia:  No  Handed:  Right  AIMS (if indicated):     Assets:  Communication Skills Desire for Improvement Housing Social Support  Sleep:       Musculoskeletal: Strength & Muscle Tone: within normal limits Gait & Station: normal, Patient in a wheel chair states that she is having difficulty walking Patient leans: N/A  Blood pressure 135/85, pulse 91, temperature 98 F (36.7 C), resp. rate 18, SpO2 100 %.  Recommendations: Inpatient psychiatric treatment once medically cleared   Disposition: Recommend psychiatric Inpatient admission when medically cleared.  Based on my evaluation the patient appears to have an emergency medical condition for which I recommend the patient be transferred to the emergency department for further evaluation.  Shuvon Rankin, NP 04/28/2018, 2:19 PM

## 2018-04-28 NOTE — ED Provider Notes (Signed)
[facility] DEPT Provider Note   CSN: [id number] Arrival date & time: 04/28/18  1441     History   Chief Complaint Chief Complaint  Patient presents with  . Medical Clearance    HPI MINDA FAAS is a 42 y.o. female.  Patient was sent over from behavioral health for medical clearance for anticipated admission there.  She is reportedly abusing benzodiazepines using Ativan and Xanax.  She reports history of multiple concussions and complains of triple vision and headaches.  It sounds like this is been fully worked up with MRI and other visits to specialists.  She had unremarkable labs yesterday and had screening labs again done today.  The history is provided by the patient.  Mental Health Problem  Patient accompanied by: friend. Onset quality:  Unable to specify Timing:  Constant Progression:  Worsening Chronicity:  Recurrent Context: medication   Relieved by:  Benzodiazepines Associated symptoms: no abdominal pain and no chest pain     Past Medical History:  Diagnosis Date  . Cancer ([place])   . Depression   . Gall stones   . Seizures ([place])   . Vertigo     There are no active problems to display for this patient.   Past Surgical History:  Procedure Laterality Date  . ABDOMINAL HYSTERECTOMY       OB History   None      Home Medications    Prior to Admission medications   Medication Sig Start Date End Date Taking? Authorizing Provider  albuterol (PROVENTIL HFA;VENTOLIN HFA) 108 (90 Base) MCG/ACT inhaler Inhale 2 puffs into the lungs 2 (two) times daily.    [provider]  albuterol (PROVENTIL) (2.5 MG/3ML) 0.083% nebulizer solution Take 2.5 mg by nebulization every 6 (six) hours as needed for wheezing or shortness of breath.    [provider]  Aspirin-Acetaminophen-Caffeine (GOODY HEADACHE PO) Take 1 packet by mouth every hour as needed (pain/headache).    [provider]  DEXAMETHASONE PO Take by  mouth. Tapered course    [provider]  dicyclomine (BENTYL) 20 MG tablet Take 1 tablet (20 mg total) by mouth 2 (two) times daily. 03/09/17   Gareth Morgan, MD  diphenhydrAMINE (BENADRYL) 25 MG tablet Take 25 mg by mouth 3 (three) times daily as needed for itching or allergies.    [provider]  estradiol (EVAMIST) 1.53 MG/SPRAY transdermal spray Place 1 spray onto the skin daily.    [provider]  lamoTRIgine (LAMICTAL) 100 MG tablet Take 200 mg by mouth every 12 (twelve) hours.    [provider]  LORazepam (ATIVAN) 1 MG tablet Take 1 mg by mouth every 6 (six) hours as needed for anxiety.  12/19/16   [provider]  meclizine (ANTIVERT) 25 MG tablet Take 25 mg by mouth 3 (three) times daily as needed for dizziness.  12/19/16   [provider]  ondansetron (ZOFRAN-ODT) 4 MG disintegrating tablet Take 4 mg by mouth every 8 (eight) hours as needed for nausea or vomiting.    [provider]  promethazine (PHENERGAN) 25 MG tablet Take 1 tablet (25 mg total) by mouth every 6 (six) hours as needed for nausea or vomiting. 03/09/17   Gareth Morgan, MD    Family History No family history on file.  Social History Social History   Tobacco Use  . Smoking status: Current Every Day Smoker    Packs/day: 0.50    Types: Cigarettes  . Smokeless tobacco: Never Used  Substance Use Topics  . Alcohol use: Yes  . Drug use: Yes    Comment: benzo's     Allergies   Imitrex [sumatriptan]; Chlorpheniramine-dm; and Triaminic fever reducer [acetaminophen]   Review of Systems Review of Systems  Constitutional: Negative for fever.  HENT: Negative for sore throat.   Eyes: Positive for visual disturbance. Negative for pain.  Respiratory: Positive for cough. Negative for shortness of breath.   Cardiovascular: Negative for chest pain.  Gastrointestinal: Negative for abdominal pain.  Genitourinary: Negative for dysuria.  Musculoskeletal:  Negative for neck pain.  Skin: Negative for rash.  Neurological: Positive for seizures (hx of).     Physical Exam Updated Vital Signs BP 116/88 (BP Location: Right Arm)   Pulse 87   Temp 98.6 F (37 C) (Oral)   Resp 14   Ht 5\' 3"  (1.6 m)   Wt 52.2 kg   SpO2 99%   BMI 20.37 kg/m   Physical Exam  Constitutional: She appears well-developed and well-nourished.  HENT:  Head: Normocephalic and atraumatic.  Eyes: Conjunctivae are normal.  Neck: Neck supple.  Cardiovascular: Normal rate, regular rhythm and normal heart sounds.  Pulmonary/Chest: Effort normal. No stridor. She has no wheezes.  Abdominal: Soft. She exhibits no mass. There is no rebound.  Musculoskeletal: Normal range of motion. She exhibits no deformity.  Neurological: She is alert. She has normal strength. GCS eye subscore is 4. GCS verbal subscore is 5. GCS motor subscore is 6.  Skin: Skin is warm and dry. Capillary refill takes less than 2 seconds.  Psychiatric: She has a normal mood and affect. Her speech is normal.  Nursing note and vitals reviewed.    ED Treatments / Results  Labs (all labs ordered are listed, but only abnormal results are displayed) Labs Reviewed  COMPREHENSIVE METABOLIC PANEL - Abnormal; Notable for the following components:      Result Value   AST 14 (*)    All other components within normal limits  RAPID URINE DRUG SCREEN, HOSP PERFORMED - Abnormal; Notable for the following components:   Benzodiazepines POSITIVE (*)    All other components within normal limits  ETHANOL  CBC  I-STAT BETA HCG BLOOD, ED ([facility], [facility], [facility] ONLY)    EKG EKG Interpretation  Date/Time:  Tuesday April 28 2018 18:07:26 EDT Ventricular Rate:  80 PR Interval:    QRS Duration: 88 QT Interval:  401 QTC Calculation: 463 R Axis:   77 Text Interpretation:  Sinus rhythm Low voltage, precordial leads Borderline T abnormalities, anterior leads Confirmed by Aletta Edouard [phone]) on 04/28/2018 6:16:28 PM Also  confirmed by Aletta Edouard [phone]), editor Philomena Doheny [phone])  on 04/29/2018 7:32:06 AM   Radiology Dg Chest 2 View  Result Date: 04/27/2018 CLINICAL DATA:  Pneumonia.  Lower abdominal pain.  Nausea, vomiting. EXAM: CHEST - 2 VIEW COMPARISON:  Chest x-ray 10/22/2015 FINDINGS: Heart and mediastinal contours are within normal limits. No focal opacities or effusions. No acute bony abnormality. IMPRESSION: No active cardiopulmonary disease. Electronically Signed   By: Rolm Baptise M.D.   On: 04/27/2018 20:56    Procedures Procedures (including critical care time)  Medications Ordered in ED Medications - No data to display   Initial Impression / Assessment and Plan / ED Course  I have reviewed the triage vital signs and the nursing notes.  Pertinent labs & imaging results that were available during my care of the patient were reviewed by me and considered in my medical decision making (see chart  for details).  Clinical Course as of Apr 28 1722  Tue Apr 28, 4274  6257 42 year old female with what sounds like benzo dependence and abuse sent over from behavioral health for medical screening prior to admission to behavioral health.  She has some chronic medical complaints that appear to have been worked up.  Today here she has normal vitals and benign exam and normal screening labs.  She is medically cleared for behavioral health assessment.   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

## 2018-04-28 NOTE — ED Notes (Signed)
Patient reports that she is dizzy and needed to be assist to restroom. Patient was assisted to restroom by RN and MHT. Patient states that she is SI and she going to die of she doesn't get her ativan. Patient denies HI/AVH at this time. Plan of care discussed. Encouragement and support provided and safety maintain. Q 15 min safety checks remain place and video monitoring.

## 2018-04-28 NOTE — ED Notes (Signed)
Pt is aware a urine sample is needed, but is unable to provide one at this time. 
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
## 2018-04-28 NOTE — ED Notes (Signed)
Pt moved to room 41 ambulatory

## 2018-04-29 ENCOUNTER — Inpatient Hospital Stay (HOSPITAL_COMMUNITY)
Admission: AD | Admit: 2018-04-29 | Discharge: 2018-05-01 | DRG: 885 | Disposition: A | Discharge status: Home / Self Care | Payer: Federal, State, Local not specified - Other | Source: Intra-hospital | Attending: Psychiatry | Admitting: Psychiatry

## 2018-04-29 DIAGNOSIS — R45851 Suicidal ideations: Secondary | ICD-10-CM | POA: Diagnosis present

## 2018-04-29 DIAGNOSIS — R45850 Homicidal ideations: Secondary | ICD-10-CM | POA: Diagnosis present

## 2018-04-29 DIAGNOSIS — F515 Nightmare disorder: Secondary | ICD-10-CM | POA: Diagnosis present

## 2018-04-29 DIAGNOSIS — I10 Essential (primary) hypertension: Secondary | ICD-10-CM | POA: Diagnosis present

## 2018-04-29 DIAGNOSIS — F1320 Sedative, hypnotic or anxiolytic dependence, uncomplicated: Secondary | ICD-10-CM | POA: Diagnosis present

## 2018-04-29 DIAGNOSIS — Z888 Allergy status to other drugs, medicaments and biological substances status: Secondary | ICD-10-CM

## 2018-04-29 DIAGNOSIS — F13239 Sedative, hypnotic or anxiolytic dependence with withdrawal, unspecified: Secondary | ICD-10-CM | POA: Diagnosis not present

## 2018-04-29 DIAGNOSIS — Z79899 Other long term (current) drug therapy: Secondary | ICD-10-CM

## 2018-04-29 DIAGNOSIS — G4700 Insomnia, unspecified: Secondary | ICD-10-CM | POA: Diagnosis present

## 2018-04-29 DIAGNOSIS — Z818 Family history of other mental and behavioral disorders: Secondary | ICD-10-CM | POA: Diagnosis not present

## 2018-04-29 DIAGNOSIS — Z87820 Personal history of traumatic brain injury: Secondary | ICD-10-CM | POA: Diagnosis not present

## 2018-04-29 DIAGNOSIS — R42 Dizziness and giddiness: Secondary | ICD-10-CM | POA: Diagnosis present

## 2018-04-29 DIAGNOSIS — F17210 Nicotine dependence, cigarettes, uncomplicated: Secondary | ICD-10-CM | POA: Diagnosis present

## 2018-04-29 DIAGNOSIS — F4310 Post-traumatic stress disorder, unspecified: Secondary | ICD-10-CM | POA: Diagnosis present

## 2018-04-29 DIAGNOSIS — F410 Panic disorder [episodic paroxysmal anxiety] without agoraphobia: Secondary | ICD-10-CM | POA: Diagnosis present

## 2018-04-29 DIAGNOSIS — Z915 Personal history of self-harm: Secondary | ICD-10-CM

## 2018-04-29 DIAGNOSIS — Z886 Allergy status to analgesic agent status: Secondary | ICD-10-CM

## 2018-04-29 DIAGNOSIS — G40909 Epilepsy, unspecified, not intractable, without status epilepticus: Secondary | ICD-10-CM | POA: Diagnosis present

## 2018-04-29 DIAGNOSIS — J45909 Unspecified asthma, uncomplicated: Secondary | ICD-10-CM | POA: Diagnosis present

## 2018-04-29 DIAGNOSIS — F322 Major depressive disorder, single episode, severe without psychotic features: Secondary | ICD-10-CM | POA: Diagnosis present

## 2018-04-29 DIAGNOSIS — Z91410 Personal history of adult physical and sexual abuse: Secondary | ICD-10-CM | POA: Diagnosis not present

## 2018-04-29 HISTORY — DX: Anxiety disorder, unspecified: F41.9

## 2018-04-29 LAB — ROCKY MTN SPOTTED FVR ABS PNL(IGG+IGM)
RMSF IgG: NEGATIVE
RMSF IgM: 0.52 index (ref 0.00–0.89)

## 2018-04-29 MED ORDER — MAGNESIUM HYDROXIDE 400 MG/5ML PO SUSP: 30.0000 mL | Freq: Every day | ORAL | Status: DC | PRN

## 2018-04-29 MED ORDER — NICOTINE 21 MG/24HR TD PT24
21.0000 mg | MEDICATED_PATCH | Freq: Every day | TRANSDERMAL | Status: DC
  Administered 2018-04-29: 21 mg via TRANSDERMAL
  Filled 2018-04-29: qty 1

## 2018-04-29 MED ORDER — LAMOTRIGINE 100 MG PO TABS
200.0000 mg | ORAL_TABLET | Freq: Two times a day (BID) | ORAL | Status: DC
  Administered 2018-04-29 – 2018-05-01 (×4): 200 mg via ORAL
  Filled 2018-04-29 (×4): qty 1
  Filled 2018-04-29: qty 28
  Filled 2018-04-29: qty 2
  Filled 2018-04-29: qty 1
  Filled 2018-04-29: qty 2
  Filled 2018-04-29: qty 28
  Filled 2018-04-29: qty 1

## 2018-04-29 MED ORDER — CHLORDIAZEPOXIDE HCL 25 MG PO CAPS
25.0000 mg | ORAL_CAPSULE | Freq: Three times a day (TID) | ORAL | Status: DC
  Administered 2018-05-01: 25 mg via ORAL
  Filled 2018-04-29: qty 1

## 2018-04-29 MED ORDER — CHLORDIAZEPOXIDE HCL 25 MG PO CAPS: 25.0000 mg | ORAL_CAPSULE | ORAL | Status: DC

## 2018-04-29 MED ORDER — CHLORDIAZEPOXIDE HCL 25 MG PO CAPS: 25.0000 mg | ORAL_CAPSULE | Freq: Every day | ORAL | Status: DC

## 2018-04-29 MED ORDER — ADULT MULTIVITAMIN W/MINERALS CH
1.0000 | ORAL_TABLET | Freq: Every day | ORAL | Status: DC
  Administered 2018-04-29: 1 via ORAL
  Filled 2018-04-29: qty 1

## 2018-04-29 MED ORDER — CHLORDIAZEPOXIDE HCL 25 MG PO CAPS
25.0000 mg | ORAL_CAPSULE | Freq: Four times a day (QID) | ORAL | Status: DC
  Administered 2018-04-29: 25 mg via ORAL
  Filled 2018-04-29: qty 1

## 2018-04-29 MED ORDER — HYDROXYZINE HCL 25 MG PO TABS: 25.0000 mg | ORAL_TABLET | Freq: Three times a day (TID) | ORAL | Status: DC | PRN

## 2018-04-29 MED ORDER — HYDROXYZINE HCL 25 MG PO TABS: 25.0000 mg | ORAL_TABLET | Freq: Four times a day (QID) | ORAL | Status: DC | PRN

## 2018-04-29 MED ORDER — ONDANSETRON 4 MG PO TBDP: 4.0000 mg | ORAL_TABLET | Freq: Four times a day (QID) | ORAL | Status: DC | PRN

## 2018-04-29 MED ORDER — VITAMIN B-1 100 MG PO TABS
100.0000 mg | ORAL_TABLET | Freq: Every day | ORAL | Status: DC
  Administered 2018-04-30 – 2018-05-01 (×2): 100 mg via ORAL
  Filled 2018-04-29 (×5): qty 1

## 2018-04-29 MED ORDER — THIAMINE HCL 100 MG/ML IJ SOLN
100.0000 mg | Freq: Once | INTRAMUSCULAR | Status: AC
  Administered 2018-04-29: 100 mg via INTRAMUSCULAR
  Filled 2018-04-29: qty 2

## 2018-04-29 MED ORDER — TRAZODONE HCL 50 MG PO TABS
50.0000 mg | ORAL_TABLET | Freq: Every evening | ORAL | Status: DC | PRN
  Administered 2018-04-29 – 2018-05-01 (×2): 50 mg via ORAL
  Filled 2018-04-29 (×3): qty 1
  Filled 2018-04-29: qty 7

## 2018-04-29 MED ORDER — LOPERAMIDE HCL 2 MG PO CAPS: 2.0000 mg | ORAL_CAPSULE | ORAL | Status: DC | PRN

## 2018-04-29 MED ORDER — GABAPENTIN 600 MG PO TABS
300.0000 mg | ORAL_TABLET | Freq: Three times a day (TID) | ORAL | Status: DC
  Filled 2018-04-29: qty 0.5

## 2018-04-29 MED ORDER — POTASSIUM CHLORIDE CRYS ER 20 MEQ PO TBCR
20.0000 meq | EXTENDED_RELEASE_TABLET | Freq: Every day | ORAL | Status: DC
  Administered 2018-04-30 – 2018-05-01 (×2): 20 meq via ORAL
  Filled 2018-04-29: qty 1
  Filled 2018-04-29: qty 7
  Filled 2018-04-29 (×3): qty 1

## 2018-04-29 MED ORDER — FUROSEMIDE 20 MG PO TABS
20.0000 mg | ORAL_TABLET | Freq: Every day | ORAL | Status: DC
  Administered 2018-04-30 – 2018-05-01 (×2): 20 mg via ORAL
  Filled 2018-04-29 (×3): qty 1
  Filled 2018-04-29: qty 7
  Filled 2018-04-29: qty 1

## 2018-04-29 MED ORDER — ADULT MULTIVITAMIN W/MINERALS CH
1.0000 | ORAL_TABLET | Freq: Every day | ORAL | Status: DC
  Filled 2018-04-29 (×3): qty 1

## 2018-04-29 MED ORDER — ALUM & MAG HYDROXIDE-SIMETH 200-200-20 MG/5ML PO SUSP: 30.0000 mL | ORAL | Status: DC | PRN

## 2018-04-29 MED ORDER — CHLORDIAZEPOXIDE HCL 25 MG PO CAPS
25.0000 mg | ORAL_CAPSULE | Freq: Four times a day (QID) | ORAL | Status: DC | PRN
  Filled 2018-04-29: qty 1

## 2018-04-29 MED ORDER — VITAMIN B-1 100 MG PO TABS: 100.0000 mg | ORAL_TABLET | Freq: Every day | ORAL | Status: DC

## 2018-04-29 MED ORDER — CHLORDIAZEPOXIDE HCL 25 MG PO CAPS: 25.0000 mg | ORAL_CAPSULE | Freq: Four times a day (QID) | ORAL | Status: DC | PRN

## 2018-04-29 MED ORDER — CHLORDIAZEPOXIDE HCL 25 MG PO CAPS
25.0000 mg | ORAL_CAPSULE | Freq: Four times a day (QID) | ORAL | Status: AC
  Administered 2018-04-29 – 2018-05-01 (×6): 25 mg via ORAL
  Filled 2018-04-29 (×5): qty 1

## 2018-04-29 MED ORDER — ALBUTEROL SULFATE HFA 108 (90 BASE) MCG/ACT IN AERS
2.0000 | INHALATION_SPRAY | Freq: Two times a day (BID) | RESPIRATORY_TRACT | Status: DC
  Administered 2018-04-29 – 2018-05-01 (×4): 2 via RESPIRATORY_TRACT
  Filled 2018-04-29 (×2): qty 6.7

## 2018-04-29 MED ORDER — GABAPENTIN 300 MG PO CAPS
300.0000 mg | ORAL_CAPSULE | Freq: Three times a day (TID) | ORAL | Status: DC
  Administered 2018-04-29: 300 mg via ORAL
  Filled 2018-04-29: qty 1

## 2018-04-29 MED ORDER — THIAMINE HCL 100 MG/ML IJ SOLN: 100.0000 mg | Freq: Once | INTRAMUSCULAR | Status: DC

## 2018-04-29 MED ORDER — CHLORDIAZEPOXIDE HCL 25 MG PO CAPS: 25.0000 mg | ORAL_CAPSULE | Freq: Three times a day (TID) | ORAL | Status: DC

## 2018-04-29 MED ORDER — ADULT MULTIVITAMIN W/MINERALS CH
1.0000 | ORAL_TABLET | Freq: Every day | ORAL | Status: DC
  Administered 2018-04-30 – 2018-05-01 (×2): 1 via ORAL
  Filled 2018-04-29 (×4): qty 1

## 2018-04-29 NOTE — ED Notes (Signed)
Pt talking on hallway phone.  

## 2018-04-29 NOTE — ED Notes (Signed)
Pt report called to Health and safety inspector at [facility].  Pt to room 301/1.  Pt denies pain or discomfort.  Pt contracts for safety verbally. Pt is in pleasant mood, smiling and good eye contact.  Pt is ready to transfer. Pt signs documents to include possession inventory.   [facility] arrives to transport patient.

## 2018-04-29 NOTE — Progress Notes (Signed)
Psychoeducational Group Note  Date:  04/29/2018 Time:  2240  Group Topic/Focus:  Wrap-Up Group:   The focus of this group is to help patients review their daily goal of treatment and discuss progress on daily workbooks.  Participation Level: Did Not Attend  Participation Quality:  Not Applicable  Affect:  Not Applicable  Cognitive:  Not Applicable  Insight:  Not Applicable  Engagement in Group: Not Applicable  Additional Comments:  The patient was unable to attend group since she was not admitted to the hallway until later in the evening.   Archie Balboa S 04/29/2018, 10:40 PM

## 2018-04-29 NOTE — ED Notes (Signed)
Pt taking a shower 

## 2018-04-29 NOTE — ED Notes (Signed)
Pt irritable at times. Anxiety noted.  Encouragement and support provided. Special checks q 15 mins in place for safety, Video monitoring in place. Will continue to monitor.

## 2018-04-29 NOTE — BH Assessment (Signed)
[facility] Assessment Progress Note  Per Buford Dresser, DO, this pt requires psychiatric hospitalization at this time.  Brook McNichols, RN, [employer] has assigned pt to [facility] Rm 301-2; [facility] will call when ready to receive pt.  Pt has signed Voluntary Admission and Consent for Treatment, as well as Consent to Release Information to her three sons and a friend, and signed forms have been faxed to [facility].  Pt's nurse, Caryl Pina, has been notified, and agrees to send original paperwork along with pt via [facility], and to call report to [phone].  Jalene Mullet, [place] Coordinator [phone]

## 2018-04-29 NOTE — Consult Note (Signed)
[facility] Consult   Reason for Consult:  SI with plan Referring Physician: EDP Patient Identification: Madeline Shaw MRN:  [id number] Principal Diagnosis: Suicidal ideation Diagnosis:   Patient Active Problem List   Diagnosis Date Noted  . 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 Suicidal ideation [R45.851]     Total Time spent with patient: 30 minutes  Subjective:   Madeline Shaw is a 42 y.o. female patient admitted with SI in setting of benzodiazepine withdrawal.  HPI:   Per chart review, Madeline Shaw was admitted with SI with plan to overdose due to benzodiazepine withdrawal. She is prescribed Ativan but has been taking more than prescribed so she ran out a week early and since has been taking her friend's Xanax. She reports taking 1.5 mg daily. She reports that she needs treatment with detox because she cannot manage her symptoms without treatment. She endorses SI. She was offered Gabapentin for withdrawal symptoms but reports side effects with the combination of Lamictal and Gabapentin which causes leg numbness and twitching. She specifically more benzodiazepines for withdrawal. Per PMP she was recently prescribed Ativan 2 mg tablets (#60 for 30 days) although she has filled multiple scripts for Klonopin in the past. Her medication is prescribed by Reinaldo Nodal, PA.   Past Psychiatric History: Benzodiazepine abuse   Risk to Self:  Yes. Endorses SI.  Risk to Others:  None. Denies HI.  Prior Inpatient Therapy:  None per chart review  Prior Outpatient Therapy:  None per chart review   Past Medical History:  Past Medical History:  Diagnosis Date  . Cancer ([place])   . Depression   . Gall stones   . Seizures ([place])   . Vertigo     Past Surgical History:  Procedure Laterality Date  . ABDOMINAL HYSTERECTOMY     Family History: No family history on file. Family Psychiatric  History: None per chart review.  Social History:   Social History   Substance and Sexual Activity  Alcohol Use Yes     Social History   Substance and Sexual Activity  Drug Use Yes   Comment: benzo's    Social History   Socioeconomic History  . Marital status: Married    Spouse name: Not on file  . Number of children: Not on file  . Years of education: Not on file  . Highest education level: Not on file  Occupational History  . Not on file  Social Needs  . Financial resource strain: Not on file  . Food insecurity:    Worry: Not on file    Inability: Not on file  . Transportation needs:    Medical: Not on file    Non-medical: Not on file  Tobacco Use  . Smoking status: Current Every Day Smoker    Packs/day: 0.50    Types: Cigarettes  . Smokeless tobacco: Never Used  Substance and Sexual Activity  . Alcohol use: Yes  . Drug use: Yes    Comment: benzo's  . Sexual activity: Not on file  Lifestyle  . Physical activity:    Days per week: Not on file    Minutes per session: Not on file  . Stress: Not on file  Relationships  . Social connections:    Talks on phone: Not on file    Gets together: Not on file    Attends religious service: Not on file    Active member of club or organization: Not on file  Attends meetings of clubs or organizations: Not on file    Relationship status: Not on file  Other Topics Concern  . Not on file  Social History Narrative  . Not on file   Additional Social History: N/A    Allergies:   Allergies  Allergen Reactions  . Imitrex [Sumatriptan] Anaphylaxis  . Chlorpheniramine-Dm Other (See Comments)    Bladder spasms - reported by [facility] in 2014 Reaction to [place]  . Triaminic Fever Reducer [Acetaminophen] Other (See Comments)    Bladder spasms/pressure on bladder    Labs:  Results for orders placed or performed during the hospital encounter of 04/28/18 (from the past 48 hour(s))  Comprehensive metabolic panel     Status: Abnormal   Collection Time: 04/28/18   3:31 PM  Result Value Ref Range   Sodium 138 135 - 145 mmol/L   Potassium 3.9 3.5 - 5.1 mmol/L   Chloride 100 98 - 111 mmol/L   CO2 25 22 - 32 mmol/L   Glucose, Bld 88 70 - 99 mg/dL   BUN 7 6 - 20 mg/dL   Creatinine, Ser 0.72 0.44 - 1.00 mg/dL   Calcium 9.3 8.9 - 10.3 mg/dL   Total Protein 6.9 6.5 - 8.1 g/dL   Albumin 3.7 3.5 - 5.0 g/dL   AST 14 (L) 15 - 41 U/L   ALT 8 0 - 44 U/L   Alkaline Phosphatase 126 38 - 126 U/L   Total Bilirubin 0.4 0.3 - 1.2 mg/dL   GFR calc non Af Amer >60 >60 mL/min   GFR calc Af Amer >60 >60 mL/min    Comment: (NOTE) The eGFR has been calculated using the CKD EPI equation. This calculation has not been validated in all clinical situations. eGFR's persistently <60 mL/min signify possible Chronic Kidney Disease.    Anion gap 13 5 - 15    Comment: Performed at [facility], [place] [address] [place], [place] [address]  Ethanol     Status: None   Collection Time: 04/28/18  3:31 PM  Result Value Ref Range   Alcohol, Ethyl (B) <10 <10 mg/dL    Comment: (NOTE) Lowest detectable limit for serum alcohol is 10 mg/dL. For medical purposes only. Performed at [facility], [place] [address], [place], [place] [address]   cbc     Status: None   Collection Time: 04/28/18  3:31 PM  Result Value Ref Range   WBC 5.4 4.0 - 10.5 K/uL   RBC 4.53 3.87 - 5.11 MIL/uL   Hemoglobin 14.5 12.0 - 15.0 g/dL   HCT 42.6 36.0 - 46.0 %   MCV 94.0 78.0 - 100.0 fL   MCH 32.0 26.0 - 34.0 pg   MCHC 34.0 30.0 - 36.0 g/dL   RDW 14.2 11.5 - 15.5 %   Platelets 244 150 - 400 K/uL    Comment: Performed at [facility], [place] [address], [place], [place] [address]  I-Stat beta hCG blood, ED     Status: None   Collection Time: 04/28/18  3:38 PM  Result Value Ref Range   I-stat hCG, quantitative <5.0 <5 mIU/mL   Comment 3            Comment:   GEST. AGE      CONC.  (mIU/mL)   <=1 WEEK        5 - 50     2 WEEKS       50 - 500  3 WEEKS       100 - 10,000     4 WEEKS     1,000 - 30,000        FEMALE AND NON-PREGNANT FEMALE:     LESS THAN 5 mIU/mL   Rapid urine drug screen (hospital performed)     Status: Abnormal   Collection Time: 04/28/18  9:21 PM  Result Value Ref Range   Opiates NONE DETECTED NONE DETECTED   Cocaine NONE DETECTED NONE DETECTED   Benzodiazepines POSITIVE (A) NONE DETECTED   Amphetamines NONE DETECTED NONE DETECTED   Tetrahydrocannabinol NONE DETECTED NONE DETECTED   Barbiturates NONE DETECTED NONE DETECTED    Comment: (NOTE) DRUG SCREEN FOR MEDICAL PURPOSES ONLY.  IF CONFIRMATION IS NEEDED FOR ANY PURPOSE, NOTIFY LAB WITHIN 5 DAYS. LOWEST DETECTABLE LIMITS FOR URINE DRUG SCREEN Drug Class                     Cutoff (ng/mL) Amphetamine and metabolites    1000 Barbiturate and metabolites    200 Benzodiazepine                 939 Tricyclics and metabolites     300 Opiates and metabolites        300 Cocaine and metabolites        300 THC                            50 Performed at [facility], [place] [address] [place],  [address]     Current Facility-Administered Medications  Medication Dose Route Frequency Provider Last Rate Last Dose  . albuterol (PROVENTIL HFA;VENTOLIN HFA) 108 (90 Base) MCG/ACT inhaler 2 puff  2 puff Inhalation BID Hayden Rasmussen, MD   2 puff at 04/29/18 0930  . furosemide (LASIX) tablet 20 mg  20 mg Oral Daily Hayden Rasmussen, MD   20 mg at 04/29/18 0930  . lamoTRIgine (LAMICTAL) tablet 200 mg  200 mg Oral Q12H Hayden Rasmussen, MD   200 mg at 04/29/18 0930  . LORazepam (ATIVAN) tablet 1 mg  1 mg Oral Q6H PRN Hayden Rasmussen, MD   1 mg at 04/29/18 1033  . meclizine (ANTIVERT) tablet 25 mg  25 mg Oral TID PRN Hayden Rasmussen, MD      . potassium chloride SA (K-DUR,KLOR-CON) CR tablet 20 mEq  20 mEq Oral Daily Hayden Rasmussen, MD   20 mEq at 04/29/18 0930   Current Outpatient Medications  Medication Sig Dispense Refill  .  acetaminophen (TYLENOL) 500 MG tablet Take 3,500 mg by mouth once.    Marland Kitchen albuterol (PROVENTIL HFA;VENTOLIN HFA) 108 (90 Base) MCG/ACT inhaler Inhale 2 puffs into the lungs 2 (two) times daily.    Marland Kitchen ALPRAZolam (XANAX) 0.5 MG tablet Take 0.5 mg by mouth daily as needed for anxiety.    . Aspirin-Acetaminophen-Caffeine (GOODY HEADACHE PO) Take 1 packet by mouth every hour as needed (pain/headache).    . clonazePAM (KLONOPIN) 2 MG tablet Take 2 mg by mouth.  2  . furosemide (LASIX) 20 MG tablet Take 20 mg by mouth daily.  1  . lamoTRIgine (LAMICTAL) 100 MG tablet Take 200 mg by mouth every 12 (twelve) hours.    . meclizine (ANTIVERT) 25 MG tablet Take 25 mg by mouth 3 (three) times daily as needed for dizziness.   11  . Potassium Chloride ER 20 MEQ TBCR Take 20  mEq by mouth daily.  1  . LORazepam (ATIVAN) 1 MG tablet Take 1 mg by mouth every 6 (six) hours as needed for anxiety.   3    Musculoskeletal: Strength & Muscle Tone: within normal limits Gait & Station: UTA since patient was lying in bed. Patient leans: N/A  Psychiatric Specialty Exam: Physical Exam  Nursing note and vitals reviewed. Constitutional: She is oriented to person, place, and time. She appears well-developed and well-nourished.  HENT:  Head: Normocephalic and atraumatic.  Neck: Normal range of motion.  Respiratory: Effort normal.  Musculoskeletal: Normal range of motion.  Neurological: She is alert and oriented to person, place, and time.  Skin: No rash noted.  Psychiatric: Her speech is normal and behavior is normal. Thought content normal. Her affect is labile. Cognition and memory are normal. She expresses impulsivity.    Review of Systems  Psychiatric/Behavioral: Positive for depression, substance abuse and suicidal ideas.  All other systems reviewed and are negative.   Blood pressure 108/72, pulse 81, temperature 98.4 F (36.9 C), temperature source Oral, resp. rate 16, height _0  (1.6 m), weight 52.2 kg, SpO2  100 %.Body mass index is 20.37 kg/m.  General Appearance: Disheveled, middle aged, Caucasian female, wearing paper hospital scrubs and lying in bed. NAD.   Eye Contact:  Good  Speech:  Clear and Coherent and Normal Rate  Volume:  Normal  Mood:  Depressed  Affect:  Labile  Thought Process:  Goal Directed, Linear and Descriptions of Associations: Intact  Orientation:  Full (Time, Place, and Person)  Thought Content:  Logical  Suicidal Thoughts:  Yes.  with intent/plan  Homicidal Thoughts:  No  Memory:  Immediate;   Good Recent;   Good Remote;   Good  Judgement:  Fair  Insight:  Fair  Psychomotor Activity:  Normal  Concentration:  Concentration: Good and Attention Span: Good  Recall:  Good  Fund of Knowledge:  Good  Language:  Good  Akathisia:  No  Handed:  Right  AIMS (if indicated):   N/A  Assets:  Communication Skills Desire for Improvement Physical Health Resilience  ADL's:  Intact  Cognition:  WNL  Sleep:    N/A   Assessment:  Madeline Shaw is a 42 y.o. female who was admitted with SI in the setting of benzodiazepine withdrawal. She warrants inpatient psychiatric hospitalization for stabilization and treatment.   Treatment Plan Summary: Daily contact with patient to assess and evaluate symptoms and progress in treatment and Medication management  -Continue CIWA protocol with Ativan with plan to taper.  -Continue Lamictal 200 mg BID for seizures.   Disposition: Recommend psychiatric Inpatient admission when medically cleared.  Faythe Dingwall, DO 04/29/2018 2:18 PM

## 2018-04-30 ENCOUNTER — Encounter (HOSPITAL_COMMUNITY): Payer: Self-pay | Admitting: *Deleted

## 2018-04-30 ENCOUNTER — Other Ambulatory Visit: Payer: Self-pay

## 2018-04-30 MED ORDER — MECLIZINE HCL 12.5 MG PO TABS
12.5000 mg | ORAL_TABLET | Freq: Two times a day (BID) | ORAL | Status: DC
  Administered 2018-04-30 – 2018-05-01 (×2): 12.5 mg via ORAL
  Filled 2018-04-30 (×6): qty 1

## 2018-04-30 MED ORDER — PRAZOSIN HCL 1 MG PO CAPS
1.0000 mg | ORAL_CAPSULE | Freq: Every day | ORAL | Status: DC
  Administered 2018-04-30: 1 mg via ORAL
  Filled 2018-04-30: qty 7
  Filled 2018-04-30 (×3): qty 1

## 2018-04-30 MED ORDER — NICOTINE 21 MG/24HR TD PT24
21.0000 mg | MEDICATED_PATCH | Freq: Every day | TRANSDERMAL | Status: DC
  Administered 2018-04-30 – 2018-05-01 (×2): 21 mg via TRANSDERMAL
  Filled 2018-04-30 (×5): qty 1

## 2018-04-30 MED ORDER — ENSURE ENLIVE PO LIQD
237.0000 mL | Freq: Two times a day (BID) | ORAL | Status: DC
  Administered 2018-04-30 (×2): 237 mL via ORAL

## 2018-04-30 MED ORDER — ACETAMINOPHEN 325 MG PO TABS
650.0000 mg | ORAL_TABLET | Freq: Four times a day (QID) | ORAL | Status: DC | PRN
  Administered 2018-04-30 – 2018-05-01 (×2): 650 mg via ORAL
  Filled 2018-04-30 (×2): qty 2
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
## 2018-04-30 NOTE — Progress Notes (Signed)
NUTRITION ASSESSMENT  Pt identified as at risk on the Malnutrition Screen Tool  INTERVENTION: Supplements: continue Ensure Enlive BID, each supplement provides 350 kcal and 20 grams of protein.  NUTRITION DIAGNOSIS: Unintentional weight loss related to sub-optimal intake as evidenced by pt report.   Goal: Pt to meet >/= 90% of their estimated nutrition needs.  Monitor:  PO intake  Assessment:  Patient admitted as a walk in. Per notes, she takes up to 8 mg Ativan/day and has recently been taking a friend's Xanax. She engages in self injurious behavior several times per year. Patient reports recently having poor sleep quality and poor appetite. Per chart review, patient refused a meal last night but drank an Ensure. Ensure was ordered BID per ONS protocol at the time of admission.  Per chart review, current weight is 112 lb and weight on 7/8 was 117 lb. This indicates 5 lb weight loss (4% body weight) in the past 1.5 months. This is not significant for time frame.   Continue to encourage PO intakes of meals, supplements, and snacks.     42 y.o. female  Height: Ht Readings from Last 1 Encounters:  04/29/18 5\' 3"  (1.6 m)    Weight: Wt Readings from Last 1 Encounters:  04/29/18 50.8 kg    Weight Hx: Wt Readings from Last 10 Encounters:  04/29/18 50.8 kg  04/28/18 52.2 kg  03/09/17 53.1 kg    BMI:  Body mass index is 19.84 kg/m. Pt meets criteria for normal weight based on current BMI.  Estimated Nutritional Needs: Kcal: 25-30 kcal/kg Protein: > 1 gram protein/kg Fluid: 1 ml/kcal  Diet Order:  Diet Order            Diet regular Room service appropriate? Yes; Fluid consistency: Thin  Diet effective now             Pt is also offered choice of unit snacks mid-morning and mid-afternoon.  Pt is eating as desired.   Lab results and medications reviewed.     Jarome Matin, MS, RD, LDN, [facility] Inpatient Clinical Dietitian Pager # [phone] After hours/weekend  pager # [phone]

## 2018-04-30 NOTE — Progress Notes (Signed)
Patient was pleasant yet anxious upon approach this morning. Patient said she slept well. Voiced no concerns or complaints. No questions. Denies SI HI AVH.  Patient was compliant with medications prescribed per provider. Safety is maintained with 15 minute checks as well as environmental checks. Will continue to monitor.

## 2018-04-30 NOTE — Progress Notes (Addendum)
NP, Corene Cornea, came to assess pt concerning the bristle she says she swallowed.  She was encouraged to eat something to help the object go down, but she says she is afraid she will "aspirate" .  Staff assured her that it is highly unlikely that she would aspirate the bristle.  Pt agreed to take her scheduled night meds and drink an Ensure, but declined a sleep aid.  She is insisting on discharging tomorrow, stating that she is not suicidal, and that being here is making her very nervous and anxious.  "My sons live with me and they are very watchful and protective.  I will be fine at home."  Much of what pt says is rapid and spoken in a low voice.  She is suspicious and looking around while talking to writer at the med window.  Support and encouragement offered.  Pt is agreeable to plan.  Discharge plans are in process.  Pt was encouraged to speak to the doctor in the morning about her discharge concerns.  Safety maintained with q15 minute checks.  0015:  Pt came to the NS stating that she needed the sleep aid as she is not able to go to sleep.  Pt is still anxious with rapid, pressured speech.  Pt was encouraged to try and relax after taking the sleep aid to help it work.  Pt voiced understanding.

## 2018-04-30 NOTE — Plan of Care (Signed)
  Problem: Education: Goal: Verbalization of understanding the information provided will improve Outcome: Progressing   Problem: Activity: Goal: Sleeping patterns will improve Outcome: Progressing   Problem: Safety: Goal: Periods of time without injury will increase Outcome: Progressing   Problem: Education: Goal: Emotional status will improve Outcome: Not Progressing Goal: Mental status will improve Outcome: Not Progressing   Problem: Activity: Goal: Interest or engagement in activities will improve Outcome: Not Progressing

## 2018-04-30 NOTE — Progress Notes (Addendum)
Pt came to staff and said that she had swallowed a bristle out of her hair brush and feels it is stuck in her throat.  She said she has a tongue piercing that she had to remove at the ED, and she was afraid it would close up, so she took a bristle and stuck it in the hole.  Pt took her scheduled Lamictal at 2000 and when she did, she said she swallowed the bristle.  She showed writer another bristle which is rubber and less than an inch long.  As a matter of fact, while talking to writer,  pt found a brush bristle on the floor near the med window.  Pt presents anxious and manic with rapid/ pressured speech.  Her BP was elevated when the MHT checked VS.  Will notify NP/provider for the unit to assess pt.  Pt has no symptoms of difficulty breathing or pain.  She has been sitting in the dayroom since this happened.

## 2018-04-30 NOTE — BHH Suicide Risk Assessment (Signed)
[place] INPATIENT:  Family/Significant Other Suicide Prevention Education  Suicide Prevention Education:  Education Completed; Raksha Wolfgang (pt's son) [phone] has been identified by the patient as the family member/significant other with whom the patient will be residing, and identified as the person(s) who will aid the patient in the event of a mental health crisis (suicidal ideations/suicide attempt).  With written consent from the patient, the family member/significant other has been provided the following suicide prevention education, prior to the and/or following the discharge of the patient.  The suicide prevention education provided includes the following:  Suicide risk factors  Suicide prevention and interventions  National Suicide Hotline telephone number  [facility] assessment telephone number  [facility] Emergency Assistance [place] and/or Residential Mobile Crisis Unit telephone number  Request made of family/significant other to:  Remove weapons (e.g., guns, rifles, knives), all items previously/currently identified as safety concern.    Remove drugs/medications (over-the-counter, prescriptions, illicit drugs), all items previously/currently identified as a safety concern.  The family member/significant other verbalizes understanding of the suicide prevention education information provided.  The family member/significant other agrees to remove the items of safety concern listed above.  SPE and review of pt's aftercare reviewed with pt's son. He states that pt no longer has access to her firearm and it has been removed from home.   Avelina Laine LCSW 04/30/2018, 10:49 AM
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
## 2018-04-30 NOTE — BHH Group Notes (Signed)
LCSW Group Therapy Note   04/30/2018 1:15pm   Type of Therapy and Topic:  Group Therapy:  Overcoming Obstacles   Participation Level:  Active   Description of Group:    In this group patients will be encouraged to explore what they see as obstacles to their own wellness and recovery. They will be guided to discuss their thoughts, feelings, and behaviors related to these obstacles. The group will process together ways to cope with barriers, with attention given to specific choices patients can make. Each patient will be challenged to identify changes they are motivated to make in order to overcome their obstacles. This group will be process-oriented, with patients participating in exploration of their own experiences as well as giving and receiving support and challenge from other group members.   Therapeutic Goals: 1. Patient will identify personal and current obstacles as they relate to admission. 2. Patient will identify barriers that currently interfere with their wellness or overcoming obstacles.  3. Patient will identify feelings, thought process and behaviors related to these barriers. 4. Patient will identify two changes they are willing to make to overcome these obstacles:      Summary of Patient Progress   Pt was attentive and engaged during today's processing group. She continues to present with pressured speech, with less irritability and is redirectable when needed. Mychele shared that her biggest obstacle involves withdrawing safety from benzos. "I have my prescription of ativan waiting for me on Wed. I just wanted to be safe and come here since I took too many and ran out." She continues to show progress in the group setting with limited insight.    Therapeutic Modalities:   Cognitive Behavioral Therapy Solution Focused Therapy Motivational Interviewing Relapse Prevention Therapy  Avelina Laine, LCSW 04/30/2018 11:03 AM

## 2018-04-30 NOTE — Progress Notes (Signed)
The patient shared with the group that she had a bad for a number of reasons. First of all, she indicated that this hospital is doing nothing for her and that her children can do a better job of taking care of her. Secondly, she indicated that she should have never agreed to come to [facility]except that she was told that she is here to have her seizures monitored. Next, she voiced her displeasure of the daytime staff since she claims that she felt as if a seizure was about to take place while outside and that the staff refused to bring her back to the hallway. In addition, she mentioned that she is not comfortable on the unit and that she feels a "negative vibe" about this hospital. Her goal for tomorrow is to get discharged.
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
## 2018-05-01 MED ORDER — LAMOTRIGINE 100 MG PO TABS: 200.0000 mg | ORAL_TABLET | Freq: Two times a day (BID) | ORAL | 0 refills | Status: DC

## 2018-05-01 MED ORDER — TRAZODONE HCL 50 MG PO TABS: 50.0000 mg | ORAL_TABLET | Freq: Every evening | ORAL | 0 refills | Status: DC | PRN

## 2018-05-01 MED ORDER — PRAZOSIN HCL 1 MG PO CAPS: 1.0000 mg | ORAL_CAPSULE | Freq: Every day | ORAL | 0 refills | Status: DC

## 2018-05-01 MED ORDER — NICOTINE 21 MG/24HR TD PT24: 21.0000 mg | MEDICATED_PATCH | Freq: Every day | TRANSDERMAL | 0 refills | Status: DC

## 2018-05-01 NOTE — Progress Notes (Signed)
Patient ID: Madeline Shaw, female   DOB: Apr 02, 1976, 42 y.o.   MRN: [id number]  Pt. Discharged per MD orders;  PT. Currently denies any HI/SI or AVH.  Pt. Declined information regarding follow up appointments and medications reviewed by RN.  Pt. Denies any questions or concerns about the medications.  Pt. Was escorted to the search room to retrieve her belongings by RN before being discharged to the hospital lobby.

## 2018-05-01 NOTE — Tx Team (Signed)
Interdisciplinary Treatment and Diagnostic Plan Update  05/01/2018 Time of Session: 0830AM Madeline Shaw MRN: [id number]  Principal Diagnosis: MDD, recurrent severe  Secondary Diagnoses: Active Problems:   MDD (major depressive disorder), severe (HCC)   Current Medications:  Current Facility-Administered Medications  Medication Dose Route Frequency Provider Last Rate Last Dose  . acetaminophen (TYLENOL) tablet 650 mg  650 mg Oral Q6H PRN Lindon Romp A, NP   650 mg at 05/01/18 0829  . albuterol (PROVENTIL HFA;VENTOLIN HFA) 108 (90 Base) MCG/ACT inhaler 2 puff  2 puff Inhalation BID Money, Lowry Ram, FNP   2 puff at 05/01/18 [phone]  . alum & mag hydroxide-simeth (MAALOX/MYLANTA) 200-200-20 MG/5ML suspension 30 mL  30 mL Oral Q4H PRN Money, Lowry Ram, FNP      . chlordiazePOXIDE (LIBRIUM) capsule 25 mg  25 mg Oral Q6H PRN Money, Lowry Ram, FNP      . chlordiazePOXIDE (LIBRIUM) capsule 25 mg  25 mg Oral TID Money, Lowry Ram, FNP       Followed by  . [START ON 05/02/2018] chlordiazePOXIDE (LIBRIUM) capsule 25 mg  25 mg Oral BH-qamhs Money, Lowry Ram, FNP       Followed by  . [START ON 05/04/2018] chlordiazePOXIDE (LIBRIUM) capsule 25 mg  25 mg Oral Daily Money, Lowry Ram, FNP      . feeding supplement (ENSURE ENLIVE) (ENSURE ENLIVE) liquid 237 mL  237 mL Oral BID BM Lindon Romp A, NP   237 mL at 04/30/18 2159  . furosemide (LASIX) tablet 20 mg  20 mg Oral Daily Money, Lowry Ram, FNP   20 mg at 05/01/18 2353  . lamoTRIgine (LAMICTAL) tablet 200 mg  200 mg Oral Q12H Money, Lowry Ram, FNP   200 mg at 05/01/18 6144  . loperamide (IMODIUM) capsule 2-4 mg  2-4 mg Oral PRN Money, Darnelle Maffucci B, FNP      . magnesium hydroxide (MILK OF MAGNESIA) suspension 30 mL  30 mL Oral Daily PRN Money, Lowry Ram, FNP      . meclizine (ANTIVERT) tablet 12.5 mg  12.5 mg Oral BID Sharma Covert, MD   12.5 mg at 05/01/18 3154  . multivitamin with minerals tablet 1 tablet  1 tablet Oral Daily Money, Lowry Ram, FNP   1 tablet  at 05/01/18 0086  . nicotine (NICODERM CQ - dosed in mg/24 hours) patch 21 mg  21 mg Transdermal Daily Lindon Romp A, NP   21 mg at 05/01/18 0825  . ondansetron (ZOFRAN-ODT) disintegrating tablet 4 mg  4 mg Oral Q6H PRN Money, Lowry Ram, FNP      . potassium chloride SA (K-DUR,KLOR-CON) CR tablet 20 mEq  20 mEq Oral Daily Money, Lowry Ram, FNP   20 mEq at 05/01/18 0824  . prazosin (MINIPRESS) capsule 1 mg  1 mg Oral QHS Sharma Covert, MD   1 mg at 04/30/18 2159  . thiamine (B-1) injection 100 mg  100 mg Intramuscular Once Money, Darnelle Maffucci B, FNP      . thiamine (VITAMIN B-1) tablet 100 mg  100 mg Oral Daily Money, Lowry Ram, FNP   100 mg at 05/01/18 7619  . traZODone (DESYREL) tablet 50 mg  50 mg Oral QHS PRN Money, Lowry Ram, FNP   50 mg at 05/01/18 0015   PTA Medications: Medications Prior to Admission  Medication Sig Dispense Refill Last Dose  . acetaminophen (TYLENOL) 500 MG tablet Take 3,500 mg by mouth once.   04/28/2018 at Unknown time  . albuterol (PROVENTIL  HFA;VENTOLIN HFA) 108 (90 Base) MCG/ACT inhaler Inhale 2 puffs into the lungs 2 (two) times daily.   04/27/2018 at Unknown time  . ALPRAZolam (XANAX) 0.5 MG tablet Take 0.5 mg by mouth daily as needed for anxiety.   04/28/2018 at Unknown time  . Aspirin-Acetaminophen-Caffeine (GOODY HEADACHE PO) Take 1 packet by mouth every hour as needed (pain/headache).   Past Month at Unknown time  . clonazePAM (KLONOPIN) 2 MG tablet Take 2 mg by mouth.  2 Past Week at Unknown time  . furosemide (LASIX) 20 MG tablet Take 20 mg by mouth daily.  1 04/27/2018 at Unknown time  . lamoTRIgine (LAMICTAL) 100 MG tablet Take 200 mg by mouth every 12 (twelve) hours.   04/28/2018 at Unknown time  . LORazepam (ATIVAN) 1 MG tablet Take 1 mg by mouth every 6 (six) hours as needed for anxiety.   3 unknown  . meclizine (ANTIVERT) 25 MG tablet Take 25 mg by mouth 3 (three) times daily as needed for dizziness.   11 04/28/2018 at Unknown time  . Potassium Chloride ER 20  MEQ TBCR Take 20 mEq by mouth daily.  1 04/27/2018 at Unknown time    Patient Stressors: Health problems Occupational concerns Substance abuse Traumatic event  Patient Strengths: Average or above average intelligence Capable of independent living General fund of knowledge Motivation for treatment/growth Supportive family/friends  Treatment Modalities: Medication Management, Group therapy, Case management,  1 to 1 session with clinician, Psychoeducation, Recreational therapy.   Physician Treatment Plan for Primary Diagnosis:MDD, recurrent severe Long Term Goal(s): Improvement in symptoms so as ready for discharge Improvement in symptoms so as ready for discharge   Short Term Goals: Ability to identify changes in lifestyle to reduce recurrence of condition will improve Ability to verbalize feelings will improve Ability to disclose and discuss suicidal ideas Ability to demonstrate self-control will improve Ability to identify and develop effective coping behaviors will improve Ability to maintain clinical measurements within normal limits will improve Ability to identify triggers associated with substance abuse/mental health issues will improve Ability to identify changes in lifestyle to reduce recurrence of condition will improve Ability to verbalize feelings will improve Ability to disclose and discuss suicidal ideas Ability to demonstrate self-control will improve Ability to identify and develop effective coping behaviors will improve Ability to maintain clinical measurements within normal limits will improve Ability to identify triggers associated with substance abuse/mental health issues will improve  Medication Management: Evaluate patient's response, side effects, and tolerance of medication regimen.  Therapeutic Interventions: 1 to 1 sessions, Unit Group sessions and Medication administration.  Evaluation of Outcomes: Adequate for Discharge  Physician Treatment Plan for  Secondary Diagnosis: Active Problems:   MDD (major depressive disorder), severe ([place])  Long Term Goal(s): Improvement in symptoms so as ready for discharge Improvement in symptoms so as ready for discharge   Short Term Goals: Ability to identify changes in lifestyle to reduce recurrence of condition will improve Ability to verbalize feelings will improve Ability to disclose and discuss suicidal ideas Ability to demonstrate self-control will improve Ability to identify and develop effective coping behaviors will improve Ability to maintain clinical measurements within normal limits will improve Ability to identify triggers associated with substance abuse/mental health issues will improve Ability to identify changes in lifestyle to reduce recurrence of condition will improve Ability to verbalize feelings will improve Ability to disclose and discuss suicidal ideas Ability to demonstrate self-control will improve Ability to identify and develop effective coping behaviors will improve Ability to  maintain clinical measurements within normal limits will improve Ability to identify triggers associated with substance abuse/mental health issues will improve     Medication Management: Evaluate patient's response, side effects, and tolerance of medication regimen.  Therapeutic Interventions: 1 to 1 sessions, Unit Group sessions and Medication administration.  Evaluation of Outcomes: Adequate for Discharge   RN Treatment Plan for Primary Diagnosis: MDD, recurrent severe Long Term Goal(s): Knowledge of disease and therapeutic regimen to maintain health will improve  Short Term Goals: Ability to remain free from injury will improve, Ability to demonstrate self-control, Ability to participate in decision making will improve and Ability to identify and develop effective coping behaviors will improve  Medication Management: RN will administer medications as ordered by provider, will assess and  evaluate patient's response and provide education to patient for prescribed medication. RN will report any adverse and/or side effects to prescribing provider.  Therapeutic Interventions: 1 on 1 counseling sessions, Psychoeducation, Medication administration, Evaluate responses to treatment, Monitor vital signs and CBGs as ordered, Perform/monitor CIWA, COWS, AIMS and Fall Risk screenings as ordered, Perform wound care treatments as ordered.  Evaluation of Outcomes: Adequate for Discharge   LCSW Treatment Plan for Primary Diagnosis: MDD, recurrent severe Long Term Goal(s): Safe transition to appropriate next level of care at discharge, Engage patient in therapeutic group addressing interpersonal concerns.  Short Term Goals: Engage patient in aftercare planning with referrals and resources, Facilitate patient progression through stages of change regarding substance use diagnoses and concerns and Identify triggers associated with mental health/substance abuse issues  Therapeutic Interventions: Assess for all discharge needs, 1 to 1 time with Social worker, Explore available resources and support systems, Assess for adequacy in community support network, Educate family and significant other(s) on suicide prevention, Complete Psychosocial Assessment, Interpersonal group therapy.  Evaluation of Outcomes: Adequate for Discharge   Progress in Treatment: Attending groups: Yes. Participating in groups: Yes. Taking medication as prescribed: Yes. Toleration medication: Yes. Family/Significant other contact made: Yes, individual(s) contacted:  pt's son Patient understands diagnosis: Yes. limited insight Discussing patient identified problems/goals with staff: Yes. Medical problems stabilized or resolved: Yes. Denies suicidal/homicidal ideation: Yes. Issues/concerns per patient self-inventory: No. Other: n/a   New problem(s) identified: No, Describe:  n/a  New Short Term/Long Term Goal(s): detox,  medication management for mood stabilization; elimination of SI thoughts; development of comprehensive mental wellness/sobriety plan.   Patient Goals:  "to get safely through withdrawals."   Discharge Plan or Barriers: Pt plans to return home; follow-up with her current provider. She would not share who she sees and does not want follow-up arranged by CSW. "I'll do all that myself." She declined referral for other services including therapy and SAIOP. [place] pamphlet, Mobile Crisis information, and AA/NA information provided to patient for additional community support and resources.   Reason for Continuation of Hospitalization: none  Estimated Length of Stay:  Friday, 05/02/18  Attendees: Patient: 05/01/2018 8:38 AM  Physician: Dr. Mallie Darting MD; Dr. Nancy Fetter MD 05/01/2018 8:38 AM  Nursing: Doroteo Bradford RN; Legrand Como RN 05/01/2018 8:38 AM  RN Care Manager:x 05/01/2018 8:38 AM  Social Worker: Janice Norrie LCSW 05/01/2018 8:38 AM  Recreational Therapist: x 05/01/2018 8:38 AM  Other: Lindell Spar NP; Saul Fordyce NP 05/01/2018 8:38 AM  Other:  05/01/2018 8:38 AM  Other: 05/01/2018 8:38 AM    Scribe for Treatment Team: Avelina Laine, LCSW 05/01/2018 8:38 AM

## 2018-05-01 NOTE — Progress Notes (Signed)
Stanton Kidney, Peer Support Specialist came to this RN with concern of possible firearm in the patients home.  Spoke with Nira Conn, SW who states that she spoke with the patients son yesterday and he reported that the patient would no longer have access to the firearm as it had been removed from the home.

## 2018-05-01 NOTE — Progress Notes (Signed)
  [facility] Adult Case Management Discharge Plan :  Will you be returning to the same living situation after discharge:  No, pt plans to live with her mother and children at discharge rather than return to her other home with roommate.  At discharge, do you have transportation home?: Yes,  bus pass/family member may pick her up.  Do you have the ability to pay for your medications: Yes,  mental health  Release of information consent forms completed and submitted to medical records by CSW.  Patient to Follow up at: Follow-up Information    Patient declined referrals and would like to make her own follow-up. Follow up.           Next level of care provider has access to [place] and Suicide Prevention discussed: Yes,  SPE completed with pt's son. [facility] pamphlet and Mobile Crisis information provided to pt.   Have you used any form of tobacco in the last 30 days? (Cigarettes, Smokeless Tobacco, Cigars, and/or Pipes): Yes  Has patient been referred to the Quitline?: Patient refused referral  Patient has been referred for addiction treatment: Pt declined all referrals  Avelina Laine, LCSW 05/01/2018, 9:17 AM

## 2018-05-01 NOTE — [facility] Suicide Risk Assessment (Signed)
[facility] Discharge Suicide Risk Assessment   Principal Problem: <principal problem not specified> Discharge Diagnoses:  Patient Active Problem List   Diagnosis Date Noted  . 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 MDD (major depressive disorder), severe ([place]) [F32.2] 04/29/2018  . Suicidal ideation [R45.851]     Total Time spent with patient: 15 minutes  Musculoskeletal: Strength & Muscle Tone: within normal limits Gait & Station: normal Patient leans: N/A  Psychiatric Specialty Exam: Review of Systems  All other systems reviewed and are negative.   Blood pressure (!) 132/95, pulse (!) 104, temperature 98.2 F (36.8 C), resp. rate 18, height 5\' 3"  (1.6 m), weight 50.8 kg, SpO2 100 %.Body mass index is 19.84 kg/m.  General Appearance: Casual  Eye Contact::  Fair  Speech:  Normal Rate409  Volume:  Normal  Mood:  Euthymic  Affect:  Congruent  Thought Process:  Coherent and Descriptions of Associations: Intact  Orientation:  Full (Time, Place, and Person)  Thought Content:  Logical  Suicidal Thoughts:  No  Homicidal Thoughts:  No  Memory:  Immediate;   Fair Recent;   Fair Remote;   Fair  Judgement:  Intact  Insight:  Lacking  Psychomotor Activity:  Normal  Concentration:  Fair  Recall:  [employer] [place]  Language: Fair  Akathisia:  Negative  Handed:  Right  AIMS (if indicated):     Assets:  Communication Skills Desire for Improvement Housing Physical Health Resilience Social Support  Sleep:  Number of Hours: 5  Cognition: WNL  ADL's:  Intact   Mental Status Per Nursing Assessment::   On Admission:  Self-harm thoughts, Thoughts of violence towards others  Demographic Factors:  Low socioeconomic status and Unemployed  Loss Factors: NA  Historical Factors: Impulsivity  Risk Reduction Factors:   Living with another person, especially a relative and Positive coping skills or problem solving skills  Continued Clinical  Symptoms:  Severe Anxiety and/or Agitation Depression:   Comorbid alcohol abuse/dependence Impulsivity Alcohol/Substance Abuse/Dependencies  Cognitive Features That Contribute To Risk:  None    Suicide Risk:  Minimal: No identifiable suicidal ideation.  Patients presenting with no risk factors but with morbid ruminations; may be classified as minimal risk based on the severity of the depressive symptoms  Follow-up Information    Patient declined referrals and would like to make her own follow-up. Follow up.           Plan Of Care/Follow-up recommendations:  Activity:  ad lib  Sharma Covert, MD 05/01/2018, 9:22 AM
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
## 2018-07-08 ENCOUNTER — Ambulatory Visit: Payer: Self-pay | Admitting: *Deleted

## 2018-07-08 NOTE — Telephone Encounter (Signed)
Left message for patient to call back  

## 2018-07-08 NOTE — Telephone Encounter (Signed)
Patient is trying to stop medications- she was told to contact concussion clinic for help with vertigo.Patient was referred from [facility]. Patient does have history of seizures. Patient does not actively have PCP at this time- she has stopped seeing them.Told patient she needs to establish care as soon as she can.  Patient has history of concussion- she had accident in May that has made vertigo worse. Patient is using walker because she is unsteady. She is so unsteady at night she is using a female uranal. Patient can be reached at [phone] or her son's phone- [phone] Danella Maiers) Told patient I would forward her call to concussion clinic for review and call back. She should expect call back with 24 hours- they may have more questions.  Answer Assessment - Initial Assessment Questions 1. DESCRIPTION: "Describe your dizziness."     Patient has no equilibrium- room spins 2. VERTIGO: "Do you feel like either you or the room is spinning or tilting?"      Room spins and she sees double- lasts for extended periods of time 3. LIGHTHEADED: "Do you feel lightheaded?" (e.g., somewhat faint, woozy, weak upon standing)     Can't eat, feels faint, nausea 4. SEVERITY: "How bad is it?"  "Can you walk?"   - MILD - Feels unsteady but walking normally.   - MODERATE - Feels very unsteady when walking, but not falling; interferes with normal activities (e.g., school, work) .   - SEVERE - Unable to walk without falling (requires assistance).     Severe- sometimes yes- sometimes-no 5. ONSET:  "When did the dizziness begin?"     Patient has been dealing with this for 3 years 6. AGGRAVATING FACTORS: "Does anything make it worse?" (e.g., standing, change in head position)     Sitting up 7. CAUSE: "What do you think is causing the dizziness?"     Head injury 8. RECURRENT SYMPTOM: "Have you had dizziness before?" If so, ask: "When was the last time?" "What happened that time?"     Patient has been  dealing with this for 3 years now- worse since may 9. OTHER SYMPTOMS: "Do you have any other symptoms?" (e.g., headache, weakness, numbness, vomiting, earache)     Left side weaker that right, headache, vomiting 10. PREGNANCY: "Is there any chance you are pregnant?" "When was your last menstrual period?"       n/a  Protocols used: DIZZINESS - VERTIGO-A-AH

## 2018-07-16 ENCOUNTER — Ambulatory Visit: Payer: Self-pay | Admitting: Medical

## 2018-07-18 ENCOUNTER — Other Ambulatory Visit: Payer: Self-pay

## 2018-07-18 ENCOUNTER — Encounter (HOSPITAL_COMMUNITY): Payer: Self-pay

## 2018-07-18 ENCOUNTER — Inpatient Hospital Stay (HOSPITAL_COMMUNITY)
Admission: AD | Admit: 2018-07-18 | Discharge: 2018-07-24 | DRG: 885 | Disposition: A | Discharge status: Home / Self Care | Payer: Federal, State, Local not specified - Other | Source: Intra-hospital | Attending: Psychiatry | Admitting: Psychiatry

## 2018-07-18 DIAGNOSIS — F315 Bipolar disorder, current episode depressed, severe, with psychotic features: Principal | ICD-10-CM | POA: Diagnosis present

## 2018-07-18 DIAGNOSIS — Z888 Allergy status to other drugs, medicaments and biological substances status: Secondary | ICD-10-CM | POA: Diagnosis not present

## 2018-07-18 DIAGNOSIS — F419 Anxiety disorder, unspecified: Secondary | ICD-10-CM | POA: Diagnosis not present

## 2018-07-18 DIAGNOSIS — Z8541 Personal history of malignant neoplasm of cervix uteri: Secondary | ICD-10-CM | POA: Diagnosis not present

## 2018-07-18 DIAGNOSIS — F4310 Post-traumatic stress disorder, unspecified: Secondary | ICD-10-CM | POA: Diagnosis present

## 2018-07-18 DIAGNOSIS — Z818 Family history of other mental and behavioral disorders: Secondary | ICD-10-CM

## 2018-07-18 DIAGNOSIS — Z23 Encounter for immunization: Secondary | ICD-10-CM | POA: Diagnosis not present

## 2018-07-18 DIAGNOSIS — S060X9A Concussion with loss of consciousness of unspecified duration, initial encounter: Secondary | ICD-10-CM | POA: Diagnosis present

## 2018-07-18 DIAGNOSIS — R45851 Suicidal ideations: Secondary | ICD-10-CM | POA: Diagnosis present

## 2018-07-18 DIAGNOSIS — F319 Bipolar disorder, unspecified: Secondary | ICD-10-CM | POA: Diagnosis not present

## 2018-07-18 DIAGNOSIS — Y9289 Other specified places as the place of occurrence of the external cause: Secondary | ICD-10-CM | POA: Diagnosis not present

## 2018-07-18 DIAGNOSIS — T7421XA Adult sexual abuse, confirmed, initial encounter: Secondary | ICD-10-CM | POA: Diagnosis present

## 2018-07-18 DIAGNOSIS — F1390 Sedative, hypnotic, or anxiolytic use, unspecified, uncomplicated: Secondary | ICD-10-CM | POA: Diagnosis not present

## 2018-07-18 DIAGNOSIS — Z599 Problem related to housing and economic circumstances, unspecified: Secondary | ICD-10-CM

## 2018-07-18 DIAGNOSIS — F1320 Sedative, hypnotic or anxiolytic dependence, uncomplicated: Secondary | ICD-10-CM | POA: Diagnosis present

## 2018-07-18 DIAGNOSIS — G4700 Insomnia, unspecified: Secondary | ICD-10-CM | POA: Diagnosis present

## 2018-07-18 DIAGNOSIS — F1310 Sedative, hypnotic or anxiolytic abuse, uncomplicated: Secondary | ICD-10-CM | POA: Diagnosis not present

## 2018-07-18 LAB — GLUCOSE, CAPILLARY
Glucose-Capillary: 67 mg/dL — ABNORMAL LOW (ref 70–99)
Glucose-Capillary: 139 mg/dL — ABNORMAL HIGH (ref 70–99)

## 2018-07-18 MED ORDER — LORAZEPAM 0.5 MG PO TABS: 0.5000 mg | ORAL_TABLET | Freq: Every day | ORAL | Status: DC

## 2018-07-18 MED ORDER — PRAZOSIN HCL 1 MG PO CAPS
1.0000 mg | ORAL_CAPSULE | Freq: Every day | ORAL | Status: DC
  Filled 2018-07-18 (×3): qty 1

## 2018-07-18 MED ORDER — LORAZEPAM 0.5 MG PO TABS: 0.5000 mg | ORAL_TABLET | Freq: Two times a day (BID) | ORAL | Status: DC

## 2018-07-18 MED ORDER — MECLIZINE HCL 25 MG PO TABS
25.0000 mg | ORAL_TABLET | Freq: Three times a day (TID) | ORAL | Status: DC | PRN
  Administered 2018-07-18 – 2018-07-19 (×4): 25 mg via ORAL
  Filled 2018-07-18 (×5): qty 1

## 2018-07-18 MED ORDER — LORAZEPAM 0.5 MG PO TABS
0.5000 mg | ORAL_TABLET | Freq: Once | ORAL | Status: AC | PRN
  Administered 2018-07-18: 0.5 mg via ORAL
  Filled 2018-07-18: qty 1

## 2018-07-18 MED ORDER — NICOTINE 21 MG/24HR TD PT24
21.0000 mg | MEDICATED_PATCH | TRANSDERMAL | Status: DC
  Administered 2018-07-18 – 2018-07-24 (×6): 21 mg via TRANSDERMAL
  Filled 2018-07-18 (×9): qty 1

## 2018-07-18 MED ORDER — LAMOTRIGINE 100 MG PO TABS
100.0000 mg | ORAL_TABLET | Freq: Once | ORAL | Status: AC
  Administered 2018-07-18: 100 mg via ORAL
  Filled 2018-07-18 (×2): qty 1

## 2018-07-18 MED ORDER — LAMOTRIGINE 100 MG PO TABS
200.0000 mg | ORAL_TABLET | Freq: Two times a day (BID) | ORAL | Status: DC
  Administered 2018-07-19 – 2018-07-24 (×11): 200 mg via ORAL
  Filled 2018-07-18: qty 1
  Filled 2018-07-18: qty 2
  Filled 2018-07-18: qty 28
  Filled 2018-07-18 (×2): qty 1
  Filled 2018-07-18: qty 2
  Filled 2018-07-18 (×6): qty 1
  Filled 2018-07-18: qty 28
  Filled 2018-07-18 (×4): qty 1

## 2018-07-18 MED ORDER — LAMOTRIGINE 100 MG PO TABS
100.0000 mg | ORAL_TABLET | Freq: Two times a day (BID) | ORAL | Status: DC
  Administered 2018-07-18: 100 mg via ORAL
  Filled 2018-07-18 (×3): qty 1

## 2018-07-18 MED ORDER — LORAZEPAM 0.5 MG PO TABS: 0.5000 mg | ORAL_TABLET | Freq: Three times a day (TID) | ORAL | Status: DC

## 2018-07-18 MED ORDER — MECLIZINE HCL 25 MG PO TABS
25.0000 mg | ORAL_TABLET | Freq: Three times a day (TID) | ORAL | Status: DC
  Filled 2018-07-18 (×3): qty 1

## 2018-07-18 MED ORDER — LORAZEPAM 0.5 MG PO TABS
0.5000 mg | ORAL_TABLET | Freq: Four times a day (QID) | ORAL | Status: AC
  Administered 2018-07-18 – 2018-07-19 (×3): 0.5 mg via ORAL
  Filled 2018-07-18 (×3): qty 1

## 2018-07-18 MED ORDER — PNEUMOCOCCAL VAC POLYVALENT 25 MCG/0.5ML IJ INJ
0.5000 mL | INJECTION | INTRAMUSCULAR | Status: AC
  Administered 2018-07-21: 0.5 mL via INTRAMUSCULAR

## 2018-07-18 MED ORDER — LORAZEPAM 0.5 MG PO TABS: 0.5000 mg | ORAL_TABLET | Freq: Two times a day (BID) | ORAL | Status: DC | PRN

## 2018-07-18 MED ORDER — ALUM & MAG HYDROXIDE-SIMETH 200-200-20 MG/5ML PO SUSP: 30.0000 mL | ORAL | Status: DC | PRN

## 2018-07-18 MED ORDER — OLANZAPINE 5 MG PO TBDP
5.0000 mg | ORAL_TABLET | Freq: Every day | ORAL | Status: DC
  Administered 2018-07-19: 5 mg via ORAL
  Filled 2018-07-18 (×3): qty 1

## 2018-07-18 MED ORDER — QUETIAPINE FUMARATE 100 MG PO TABS
100.0000 mg | ORAL_TABLET | Freq: Every day | ORAL | Status: DC
  Filled 2018-07-18: qty 1

## 2018-07-18 MED ORDER — OLANZAPINE 5 MG PO TBDP
ORAL_TABLET | ORAL | Status: AC
  Administered 2018-07-18: 22:00:00
  Filled 2018-07-18: qty 1

## 2018-07-18 MED ORDER — MAGNESIUM HYDROXIDE 400 MG/5ML PO SUSP: 30.0000 mL | Freq: Every day | ORAL | Status: DC | PRN

## 2018-07-18 MED ORDER — QUETIAPINE FUMARATE 50 MG PO TABS
50.0000 mg | ORAL_TABLET | Freq: Two times a day (BID) | ORAL | Status: DC
  Administered 2018-07-18: 50 mg via ORAL
  Filled 2018-07-18 (×3): qty 1

## 2018-07-18 NOTE — Tx Team (Signed)
Initial Treatment Plan 07/18/2018 3:30 PM [name]:[id number]    PATIENT STRESSORS: Financial difficulties Health problems Legal issue Substance abuse Traumatic event   PATIENT STRENGTHS: Ability for insight Average or above average intelligence Communication skills   PATIENT IDENTIFIED PROBLEMS: Substance abuse - "I want to detox"   "My best friend died, my son got kicked out of school"  SI with a plan  "I don't want to have a seizure"               DISCHARGE CRITERIA:  Ability to meet basic life and health needs Improved stabilization in mood, thinking, and/or behavior Need for constant or close observation no longer present Verbal commitment to aftercare and medication compliance  PRELIMINARY DISCHARGE PLAN: Attend aftercare/continuing care group Placement in alternative living arrangements  PATIENT/FAMILY INVOLVEMENT: This treatment plan has been presented to and reviewed with the patient, [name].The patient and family have been given the opportunity to ask questions and make suggestions.  Chauncy Passy, RN 07/18/2018, 3:30 PM
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
## 2018-07-19 DIAGNOSIS — F1390 Sedative, hypnotic, or anxiolytic use, unspecified, uncomplicated: Secondary | ICD-10-CM

## 2018-07-19 DIAGNOSIS — F319 Bipolar disorder, unspecified: Secondary | ICD-10-CM

## 2018-07-19 DIAGNOSIS — R45851 Suicidal ideations: Secondary | ICD-10-CM

## 2018-07-19 DIAGNOSIS — F419 Anxiety disorder, unspecified: Secondary | ICD-10-CM

## 2018-07-19 MED ORDER — LORAZEPAM 1 MG PO TABS: 1.0000 mg | ORAL_TABLET | Freq: Two times a day (BID) | ORAL | Status: DC

## 2018-07-19 MED ORDER — LOPERAMIDE HCL 2 MG PO CAPS
2.0000 mg | ORAL_CAPSULE | ORAL | Status: AC | PRN
  Administered 2018-07-20: 2 mg via ORAL
  Administered 2018-07-20: 4 mg via ORAL
  Filled 2018-07-19: qty 2
  Filled 2018-07-19: qty 1

## 2018-07-19 MED ORDER — LORAZEPAM 1 MG PO TABS
1.0000 mg | ORAL_TABLET | Freq: Four times a day (QID) | ORAL | Status: AC | PRN
  Administered 2018-07-19 – 2018-07-21 (×4): 1 mg via ORAL
  Filled 2018-07-19 (×4): qty 1

## 2018-07-19 MED ORDER — ONDANSETRON 4 MG PO TBDP: 4.0000 mg | ORAL_TABLET | Freq: Four times a day (QID) | ORAL | Status: AC | PRN

## 2018-07-19 MED ORDER — LORAZEPAM 1 MG PO TABS: 1.0000 mg | ORAL_TABLET | Freq: Four times a day (QID) | ORAL | Status: DC

## 2018-07-19 MED ORDER — LORAZEPAM 1 MG PO TABS
1.0000 mg | ORAL_TABLET | Freq: Two times a day (BID) | ORAL | Status: AC
  Administered 2018-07-20 (×2): 1 mg via ORAL
  Filled 2018-07-19 (×2): qty 1

## 2018-07-19 MED ORDER — VITAMIN B-1 100 MG PO TABS
100.0000 mg | ORAL_TABLET | Freq: Every day | ORAL | Status: DC
  Administered 2018-07-20 – 2018-07-24 (×5): 100 mg via ORAL
  Filled 2018-07-19 (×7): qty 1

## 2018-07-19 MED ORDER — LORAZEPAM 1 MG PO TABS: 1.0000 mg | ORAL_TABLET | Freq: Every day | ORAL | Status: DC

## 2018-07-19 MED ORDER — LORAZEPAM 1 MG PO TABS: 1.0000 mg | ORAL_TABLET | Freq: Three times a day (TID) | ORAL | Status: DC

## 2018-07-19 MED ORDER — THIAMINE HCL 100 MG/ML IJ SOLN: 100.0000 mg | Freq: Once | INTRAMUSCULAR | Status: DC

## 2018-07-19 MED ORDER — LORAZEPAM 1 MG PO TABS
1.0000 mg | ORAL_TABLET | Freq: Every day | ORAL | Status: AC
  Administered 2018-07-21: 1 mg via ORAL
  Filled 2018-07-19: qty 1

## 2018-07-19 MED ORDER — HYDROXYZINE HCL 25 MG PO TABS
25.0000 mg | ORAL_TABLET | Freq: Four times a day (QID) | ORAL | Status: DC | PRN
  Administered 2018-07-19: 25 mg via ORAL
  Filled 2018-07-19: qty 1

## 2018-07-19 MED ORDER — LORAZEPAM 1 MG PO TABS
1.0000 mg | ORAL_TABLET | Freq: Three times a day (TID) | ORAL | Status: AC
  Administered 2018-07-19 (×2): 1 mg via ORAL
  Filled 2018-07-19 (×2): qty 1

## 2018-07-19 MED ORDER — ADULT MULTIVITAMIN W/MINERALS CH
1.0000 | ORAL_TABLET | Freq: Every day | ORAL | Status: DC
  Administered 2018-07-19 – 2018-07-24 (×6): 1 via ORAL
  Filled 2018-07-19 (×8): qty 1
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
## 2018-07-19 NOTE — Progress Notes (Signed)
D.  Pt pleasant on approach, appears less anxious than previous night.  Pt has been observed out within milieu more and did attend evening AA group.  Pt denies SI/HI/AVH at this time.  Pt worried about Ativan taper as she came in late yesterday and didn't receive the four doses ordered for the first day of taper, received two.   Today she received three and tomorrow it will be two and Pt feels that this may be too sudden for her.  Pt has been compliant with medication, would like meclizine to be scheduled rather than prn.  Pt denies SI/HI/AVH at this time.  A.  Support ane encouragement offered, medication given as ordered  R. Pt remains safe on the unit, will continue to monitor.

## 2018-07-19 NOTE — Progress Notes (Signed)
Patient did attend the evening speaker AA meeting.  

## 2018-07-19 NOTE — [facility] Suicide Risk Assessment (Signed)
[facility] INPATIENT:  Family/Significant Other Suicide Prevention Education  Suicide Prevention Education:  Patient Refusal for Family/Significant Other Suicide Prevention Education: The patient HOLLIE WOJAHN has refused to provide written consent for family/significant other to be provided Family/Significant Other Suicide Prevention Education during admission and/or prior to discharge.  Physician notified.  Suicide Prevention Education was reviewed thoroughly with patient, including risk factors, warning signs, and what to do.  Mobile Crisis services were described and that telephone number pointed out, with encouragement to patient to put this number in personal cell phone.  Brochure was provided to patient to share with natural supports.  Patient acknowledged the ways in which they are at risk, and how working through each of their issues can gradually start to reduce their risk factors.  Patient was encouraged to think of the information in the context of people in their own lives.  Patient denied having access to firearms  Patient verbalized understanding of information provided.   Berlin Hun Grossman-Orr 07/19/2018, 4:06 PM
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
## 2018-07-19 NOTE — Progress Notes (Signed)
D.  Pt extremely anxious upon approach, stated that she felt weak, shakey, and overall "just bad".  Pt sat on floor in hall until dose of Ativan given stating that she could not walk.  Pt's blood pressure was initially low and Pt unable to receive nightly dose of minipress. Pt remained very anxious and requested that her Ativan be allowed every six hours tonight.  NP gave a one time extra dose of 0.5 mg prn and Pt elected to take it just before going to bed.  Pt denies SI/HI/AVH at this time.  Pt states that she is agreeable to Ativan taper but feels that the current dose of 0.5 mg is not effective to start out with.  Pt states "I was taking 4 mg a day, I'm going to have a seizure and die".  Pt also requested that her blood sugar be taken, it was 67.  Pt given juice and graham cracker and it immediately came up.  Pt states "It is always low when I come in here".  A.  Support and encouragement offered, medication given as ordered.  R.  Pt remains safe on the unit, will continue to monitor.
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
## 2018-07-19 NOTE — BHH Group Notes (Signed)
[place] Group Notes:  (Nursing/MHT/Case Management/Adjunct)  Date:  07/19/2018  Time:  4:49 PM  Type of Therapy:  Nurse Education  Participation Level:  Did Not Attend  Summary of Progress/Problems: This group focused on [place] 5 love languages. We discussed definitions of each of the different love languages. We also covered healthy boundaries in our relationships. The group was given homework to take a quiz and find out what their love language is and apply that to their relationships.  Lesli Albee 07/19/2018, 4:49 PM
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
## 2018-07-19 NOTE — Progress Notes (Signed)
D. Pt presents with an anxious affect upon approach- is somewhat hyper-focused and fearful concerning her ativan dosage and having a seizure. Pt observed in the milieu interacting well with peers.Pt currently denies SI/HI and AVH and agrees to contact staff before acting on any harmful thoughts.  A. Labs and vitals monitored. Pt compliant with medications. Pt supported emotionally and encouraged to express concerns and ask questions.   R. Pt remains safe with 15 minute checks. Will continue POC.

## 2018-07-20 DIAGNOSIS — F315 Bipolar disorder, current episode depressed, severe, with psychotic features: Principal | ICD-10-CM

## 2018-07-20 DIAGNOSIS — F1320 Sedative, hypnotic or anxiolytic dependence, uncomplicated: Secondary | ICD-10-CM

## 2018-07-20 MED ORDER — OLANZAPINE 10 MG PO TBDP
10.0000 mg | ORAL_TABLET | Freq: Every day | ORAL | Status: DC
  Administered 2018-07-20 – 2018-07-22 (×3): 10 mg via ORAL
  Filled 2018-07-20 (×4): qty 1

## 2018-07-20 MED ORDER — ESCITALOPRAM OXALATE 5 MG PO TABS
5.0000 mg | ORAL_TABLET | Freq: Every day | ORAL | Status: DC
  Administered 2018-07-20 – 2018-07-22 (×3): 5 mg via ORAL
  Filled 2018-07-20 (×5): qty 1

## 2018-07-20 MED ORDER — DIMENHYDRINATE 50 MG PO TABS
50.0000 mg | ORAL_TABLET | Freq: Three times a day (TID) | ORAL | Status: DC | PRN
  Administered 2018-07-20 – 2018-07-24 (×7): 50 mg via ORAL
  Filled 2018-07-20 (×8): qty 1

## 2018-07-20 MED ORDER — MECLIZINE HCL 25 MG PO TABS: 25.0000 mg | ORAL_TABLET | Freq: Three times a day (TID) | ORAL | Status: DC

## 2018-07-20 MED ORDER — ENSURE ENLIVE PO LIQD
237.0000 mL | Freq: Two times a day (BID) | ORAL | Status: DC
  Administered 2018-07-21: 237 mL via ORAL

## 2018-07-20 MED ORDER — HYDROXYZINE HCL 50 MG PO TABS
50.0000 mg | ORAL_TABLET | Freq: Four times a day (QID) | ORAL | Status: DC | PRN
  Administered 2018-07-20 – 2018-07-23 (×8): 50 mg via ORAL
  Filled 2018-07-20: qty 1
  Filled 2018-07-20: qty 10
  Filled 2018-07-20 (×6): qty 1

## 2018-07-20 NOTE — Progress Notes (Signed)
D  Pt is pleasant on approach and cooperative   She reports feeling anxious and wanted a dose of ativan    She endorses depression and anxiety ongoing but reports some improvement after taking ativan   She interacts well with peers  A   Verbal  support given     Medications administered and effectiveness monitored    Q 15 min checks R  Pt is safe at this time

## 2018-07-20 NOTE — [facility] Group Notes (Addendum)
Pt attended spiritual care group on grief and loss facilitated by chaplain Jerene Pitch   Group goal of establishing open and affirming space for members to recognize process of grief, normalize and support grief experience and provide psycho social education and grief support. Group opened with brief discussion and psycho-social ed around grief and loss in relationships and in relation to self - identifying life patterns, circumstances, changes connected to grief response. Group participated in facilitated process group around topic of grief.Group members engaged with Four Tasks of Grief as a framework for understanding grief journey.    Madeline Shaw was present at beginning of group.  Alert and attentive as evidenced by eye contact and non verbal affirmation during speaker introduction of group.   Madeline Shaw appeared tearful in group.  She stepped out of group and did not return.  After group, requested if chaplain could meet with her 1:1.    Will follow up with Madeline Shaw today for continued support.    Jerene Pitch, MDiv, [facility]

## 2018-07-20 NOTE — Plan of Care (Signed)
Nurse discussed anxiety, depression, coping skills with patient. 
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
## 2018-07-20 NOTE — Progress Notes (Signed)
NUTRITION ASSESSMENT  Pt identified as at risk on the Malnutrition Screen Tool  INTERVENTION: - Will order Ensure Enlive BID, each supplement provides 350 kcal and 20 grams of protein. - Will order daily multivitamin with minerals. - Continue to encourage PO intakes.    NUTRITION DIAGNOSIS: Unintentional weight loss related to sub-optimal intake as evidenced by pt report.   Goal: Pt to meet >/= 90% of their estimated nutrition needs.  Monitor:  PO intake  Assessment:  Patient admitted for bipolar disorder, depression, PTSD, and substance use/abuse. Per chart review, current weight is 102 lb and weight on 8/28 was 112 lb. This indicates 10 lb weight loss (9% body weight) in the past 2.5 months; this is significant for time frame.     42 y.o. female  Height: Ht Readings from Last 1 Encounters:  07/18/18 5\' 1"  (1.549 m)    Weight: Wt Readings from Last 1 Encounters:  07/18/18 46.3 kg    Weight Hx: Wt Readings from Last 10 Encounters:  07/18/18 46.3 kg  04/29/18 50.8 kg  04/28/18 52.2 kg  03/09/17 53.1 kg    BMI:  Body mass index is 19.27 kg/m. Pt meets criteria for normal weight based on current BMI.  Estimated Nutritional Needs: Kcal: 25-30 kcal/kg Protein: > 1 gram protein/kg Fluid: 1 ml/kcal  Diet Order:  Diet Order    None     Pt is also offered choice of unit snacks mid-morning and mid-afternoon.  Pt is eating as desired.   Lab results and medications reviewed.     Jarome Matin, MS, RD, LDN, [facility] Inpatient Clinical Dietitian Pager # [phone] After hours/weekend pager # [phone]
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
## 2018-07-20 NOTE — Progress Notes (Signed)
D:  Patient's self inventory sheet, patient has poor sleep, no sleep medication.  Fair appetite, low energy level, poor concentration.  Rated depression and hopeless 6, anxiety 10.    Withdrawals, tremors, diarrhea, chilling, agitation, cough.  SI, off/on, contracts for safety, no plan.  Lightheaded, pain, dizzy, headaches, blurred vision.  Physical pain, face, zero pain.  "I know I am at the right place doing the right thing to be safe."  No discharge plans. A:  Medications administered per MD orders.  Emotional support and encouragement given patient. R:  Patient denied HI.  Denied visual hallucinations.  Sometimes she does have SI thoughts, no pain, contracts for safety.  Patient stated she hears voices of her deceased friend telling her "come be with me".  EKG completed and given to MD for review.

## 2018-07-20 NOTE — Progress Notes (Signed)
Recreation Therapy Notes  Date: 11.18.19 Time: 0930 Location: [address] Dayroom  Group Topic: Stress Management  Goal Area(s) Addresses:  Patient will verbalize importance of using healthy stress management.  Patient will identify positive emotions associated with healthy stress management.   Intervention: Stress Management  Activity :  Guided Imagery.  LRT introduced the stress management technique of guided imagery.  LRT read a script that took patients on a journey through the forest.  Patients were to listen as the script was read to engage in the activity.  Education:  Stress Management, Discharge Planning.   Education Outcome: Acknowledges edcuation/In group clarification offered/Needs additional education  Clinical Observations/Feedback: Pt did not attend group.     Victorino Sparrow,  LRT/CTRS         Victorino Sparrow A 07/20/2018 11:23 AM

## 2018-07-20 NOTE — Tx Team (Signed)
Interdisciplinary Treatment and Diagnostic Plan Update  07/20/2018 Time of Session:  Madeline Shaw MRN: [id number]  Principal Diagnosis: <principal problem not specified>  Secondary Diagnoses: Active Problems:   Bipolar 1 disorder (HCC)   Current Medications:  Current Facility-Administered Medications  Medication Dose Route Frequency Provider Last Rate Last Dose  . alum & mag hydroxide-simeth (MAALOX/MYLANTA) 200-200-20 MG/5ML suspension 30 mL  30 mL Oral Q4H PRN Derrill Center, NP      . dimenhyDRINATE (DRAMAMINE) tablet 50 mg  50 mg Oral Q8H PRN Pennelope Bracken, MD      . escitalopram (LEXAPRO) tablet 5 mg  5 mg Oral Daily Pennelope Bracken, MD   5 mg at 07/20/18 1139  . feeding supplement (ENSURE ENLIVE) (ENSURE ENLIVE) liquid 237 mL  237 mL Oral BID BM Johnn Hai, MD      . hydrOXYzine (ATARAX/VISTARIL) tablet 50 mg  50 mg Oral Q6H PRN Pennelope Bracken, MD   50 mg at 07/20/18 1143  . lamoTRIgine (LAMICTAL) tablet 200 mg  200 mg Oral BID Cobos, Myer Peer, MD   200 mg at 07/20/18 3546  . loperamide (IMODIUM) capsule 2-4 mg  2-4 mg Oral PRN Cobos, Myer Peer, MD      . LORazepam (ATIVAN) tablet 1 mg  1 mg Oral Q6H PRN Cobos, Myer Peer, MD   1 mg at 07/20/18 0326  . LORazepam (ATIVAN) tablet 1 mg  1 mg Oral BID Johnn Hai, MD   1 mg at 07/20/18 5681   Followed by  . [START ON 07/21/2018] LORazepam (ATIVAN) tablet 1 mg  1 mg Oral Daily Johnn Hai, MD      . magnesium hydroxide (MILK OF MAGNESIA) suspension 30 mL  30 mL Oral Daily PRN Derrill Center, NP      . multivitamin with minerals tablet 1 tablet  1 tablet Oral Daily Cobos, Myer Peer, MD   1 tablet at 07/20/18 [phone]  . nicotine (NICODERM CQ - dosed in mg/24 hours) patch 21 mg  21 mg Transdermal Q24H Derrill Center, NP   21 mg at 07/19/18 1703  . OLANZapine zydis (ZYPREXA) disintegrating tablet 10 mg  10 mg Oral QHS Pennelope Bracken, MD      . ondansetron (ZOFRAN-ODT) disintegrating  tablet 4 mg  4 mg Oral Q6H PRN Cobos, Myer Peer, MD      . pneumococcal 23 valent vaccine (PNU-IMMUNE) injection 0.5 mL  0.5 mL Intramuscular Tomorrow-1000 Derrill Center, NP      . thiamine (VITAMIN B-1) tablet 100 mg  100 mg Oral Daily Cobos, Myer Peer, MD   100 mg at 07/20/18 7001   PTA Medications: Medications Prior to Admission  Medication Sig Dispense Refill Last Dose  . albuterol (PROVENTIL HFA;VENTOLIN HFA) 108 (90 Base) MCG/ACT inhaler Inhale 2 puffs into the lungs 2 (two) times daily.   Past Month at Unknown time  . furosemide (LASIX) 20 MG tablet Take 20 mg by mouth daily.  1 Past Week at Unknown time  . lamoTRIgine (LAMICTAL) 100 MG tablet Take 2 tablets (200 mg total) by mouth every 12 (twelve) hours. 60 tablet 0 Past Week at Unknown time  . LORazepam (ATIVAN) 1 MG tablet Take 1 mg by mouth 3 (three) times daily.     . meclizine (ANTIVERT) 25 MG tablet Take 25 mg by mouth 3 (three) times daily as needed for dizziness.   11 Past Week at Unknown time  . Potassium Chloride ER 20 MEQ TBCR  Take 20 mEq by mouth daily.  1 Past Week at Unknown time    Patient Stressors: Financial difficulties Health problems Legal issue Substance abuse Traumatic event  Patient Strengths: Ability for insight Average or above average intelligence Communication skills  Treatment Modalities: Medication Management, Group therapy, Case management,  1 to 1 session with clinician, Psychoeducation, Recreational therapy.   Physician Treatment Plan for Primary Diagnosis: <principal problem not specified> Long Term Goal(s): Improvement in symptoms so as ready for discharge Improvement in symptoms so as ready for discharge   Short Term Goals: Ability to identify changes in lifestyle to reduce recurrence of condition will improve Ability to maintain clinical measurements within normal limits will improve Ability to identify triggers associated with substance abuse/mental health issues will  improve  Medication Management: Evaluate patient's response, side effects, and tolerance of medication regimen.  Therapeutic Interventions: 1 to 1 sessions, Unit Group sessions and Medication administration.  Evaluation of Outcomes: Not Met  Physician Treatment Plan for Secondary Diagnosis: Active Problems:   Bipolar 1 disorder ([place])  Long Term Goal(s): Improvement in symptoms so as ready for discharge Improvement in symptoms so as ready for discharge   Short Term Goals: Ability to identify changes in lifestyle to reduce recurrence of condition will improve Ability to maintain clinical measurements within normal limits will improve Ability to identify triggers associated with substance abuse/mental health issues will improve     Medication Management: Evaluate patient's response, side effects, and tolerance of medication regimen.  Therapeutic Interventions: 1 to 1 sessions, Unit Group sessions and Medication administration.  Evaluation of Outcomes: Not Met   RN Treatment Plan for Primary Diagnosis: <principal problem not specified> Long Term Goal(s): Knowledge of disease and therapeutic regimen to maintain health will improve  Short Term Goals: Ability to verbalize feelings will improve, Ability to disclose and discuss suicidal ideas, Ability to identify and develop effective coping behaviors will improve and Compliance with prescribed medications will improve  Medication Management: RN will administer medications as ordered by provider, will assess and evaluate patient's response and provide education to patient for prescribed medication. RN will report any adverse and/or side effects to prescribing provider.  Therapeutic Interventions: 1 on 1 counseling sessions, Psychoeducation, Medication administration, Evaluate responses to treatment, Monitor vital signs and CBGs as ordered, Perform/monitor CIWA, COWS, AIMS and Fall Risk screenings as ordered, Perform wound care treatments as  ordered.  Evaluation of Outcomes: Not Met   LCSW Treatment Plan for Primary Diagnosis: <principal problem not specified> Long Term Goal(s): Safe transition to appropriate next level of care at discharge, Engage patient in therapeutic group addressing interpersonal concerns.  Short Term Goals: Engage patient in aftercare planning with referrals and resources  Therapeutic Interventions: Assess for all discharge needs, 1 to 1 time with Social worker, Explore available resources and support systems, Assess for adequacy in community support network, Educate family and significant other(s) on suicide prevention, Complete Psychosocial Assessment, Interpersonal group therapy.  Evaluation of Outcomes: Not Met   Progress in Treatment: Attending groups: No. Participating in groups: No. Taking medication as prescribed: Yes. Toleration medication: Yes. Family/Significant other contact made: No, will contact:  patient declines consent for collateral contacts Patient understands diagnosis: Yes. Discussing patient identified problems/goals with staff: Yes. Medical problems stabilized or resolved: Yes. Denies suicidal/homicidal ideation: Yes. Issues/concerns per patient self-inventory: No. Other:   New problem(s) identified: None   New Short Term/Long Term Goal(s):Detox, medication stabilization, elimination of SI thoughts, development of comprehensive mental wellness plan.   Patient Goals: I  want to detox    Discharge Plan or Barriers: Patient plans to discharge home and follow up with [facility] for outpatient medication management and therapy services.   Reason for Continuation of Hospitalization: Depression Hallucinations Medication stabilization Suicidal ideation  Estimated Length of Stay: 3-5 days   Attendees: Patient: 07/20/2018 1:30 PM  Physician: Dr. Maris Berger, MD 07/20/2018 1:30 PM  Nursing: Rise Paganini. Raliegh Ip, RN  07/20/2018 1:30 PM  RN Care Manager: Lars Pinks,  RN 07/20/2018 1:30 PM  Social Worker: Radonna Ricker, Felicity 07/20/2018 1:30 PM  Recreational Therapist: X 07/20/2018 1:30 PM  Other:  07/20/2018 1:30 PM  Other:  07/20/2018 1:30 PM  Other: 07/20/2018 1:30 PM    Scribe for Treatment Team: Marylee Floras, [place] 07/20/2018 1:30 PM

## 2018-07-20 NOTE — Progress Notes (Signed)
D:  Patient's self inventory sheet, patient has poor sleep, no sleep medication.  Fair appetite, low energy level, poor concentration.  Rated depression and hopeless 6, anxiety 10.    Withdrawals, tremors, diarrhea, chilling, agitation, cough.  SI, off/on, contracts for safety, no plan.  Lightheaded, pain, dizzy, headaches, blurred vision.  Physical pain, face, zero pain.  "I know I am at the right place doing the right thing to be safe."  No discharge plans. A:  Medications administered per MD orders.  Emotional support and encouragement given patient. R:  Patient denied HI.  Denied visual hallucinations.  Sometimes she does have SI thoughts, no plan, contracts for safety.  Patient stated she hears voices of her deceased friend telling her "come be with me".  EKG completed and given to MD for review.

## 2018-07-21 LAB — PREGNANCY, URINE: PREG TEST UR: NEGATIVE
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
## 2018-07-21 NOTE — Progress Notes (Addendum)
D:  Patient's self inventory sheet, patient has poor sleep, did have sleep medication.  Fair appetite, low energy level, poor concentration.  Rated depression 2.  Withdrawals, tremors, diarrhea, chilling, cravings, agitation.  SI, contracts for safety.  Physical problems, lightheaded, dizziness, headaches, blurred vision.  No pain medicine.  Goal is "dealing with life, how I feel."  Plans to talk to someone.  No discharge plans. A:  Medications administered per MD orders.  Emotional support and encouragement given patient. R:  Denied HI.  Patient does have SI thoughts off/on, contracts for safety, no plan.  Does hear the voice of her deceased friend and also has been seeing spots (?) on the floor.  Safety maintained with 15 minute checks. Patient will discuss medications with MD, wants some of her medicines more often than they are scheduled.  Would like dramamine more often than every 8 hours prn.

## 2018-07-21 NOTE — Plan of Care (Signed)
Nurse discussed anxiety, depression, coping skills with patient. 

## 2018-07-21 NOTE — [facility] Group Notes (Signed)
[facility] Group Notes:  (Nursing/MHT/Case Management/Adjunct)  Date:  07/21/2018  Time:  4:00 pm  Type of Therapy:  Psychoeducational Skills  Participation Level:  Active  Participation Quality:  Appropriate  Affect:  Appropriate  Cognitive:  Appropriate  Insight:  Appropriate  Engagement in Group:  Engaged  Modes of Intervention:  Discussion and Education  Summary of Progress/Problems:  Patient was alert and active during group.   Cammy Copa 07/21/2018, 5:37 PM

## 2018-07-22 MED ORDER — ESCITALOPRAM OXALATE 10 MG PO TABS
10.0000 mg | ORAL_TABLET | Freq: Every day | ORAL | Status: DC
  Administered 2018-07-23 – 2018-07-24 (×2): 10 mg via ORAL
  Filled 2018-07-22: qty 1
  Filled 2018-07-22: qty 7
  Filled 2018-07-22 (×2): qty 1

## 2018-07-22 MED ORDER — METRONIDAZOLE 250 MG PO TABS
500.0000 mg | ORAL_TABLET | Freq: Two times a day (BID) | ORAL | Status: DC
  Administered 2018-07-22 – 2018-07-24 (×3): 500 mg via ORAL
  Filled 2018-07-22 (×5): qty 2
  Filled 2018-07-22: qty 14
  Filled 2018-07-22 (×2): qty 2
  Filled 2018-07-22: qty 14
  Filled 2018-07-22 (×2): qty 2

## 2018-07-22 MED ORDER — IBUPROFEN 600 MG PO TABS
600.0000 mg | ORAL_TABLET | Freq: Four times a day (QID) | ORAL | Status: DC | PRN
  Administered 2018-07-23: 600 mg via ORAL
  Filled 2018-07-22: qty 1

## 2018-07-22 MED ORDER — GABAPENTIN 100 MG PO CAPS
100.0000 mg | ORAL_CAPSULE | Freq: Three times a day (TID) | ORAL | Status: DC
  Administered 2018-07-22 – 2018-07-23 (×4): 100 mg via ORAL
  Filled 2018-07-22 (×8): qty 1

## 2018-07-22 NOTE — Progress Notes (Signed)
Nursing 1:1 note D:Pt observed sitting on mattress on the floor. RR even and unlabored. No distress noted. A: 1:1 observation continues for safety  R: pt remains safe

## 2018-07-22 NOTE — Progress Notes (Addendum)
D:  Madeline Shaw has been at the medication window multiple times this evening.  She requested her ativan and dramamine early evening.  Explained the ativan is per CIWA and that her dramamine is q 8 hours.  She was upset because she takes it three times per day at home.  When it was explained to her that q 8 hours is three times per day she said, fine.  She continued to voice withdrawal symptoms of dizziness, blurred vision and tremors.  No tremors felt/seen when she was talking with RN at the med window despite while holding her hands out, severe tremors noted.  She also said that the dizziness is due to the withdrawal as well but she already stated she had dizziness at home in which she takes OTC dramamine.  Vital signs were stable.  She was noted sitting in the day room interacting well with peers.  At hs med pass, she was upset again that she wasn't able to get ativan or dramamine.  Explained to her when the dramamine would be due again and RN will gladly give it if she needed it.  Reported being worried about seizures from withdrawal.  CIWA was assessed to be a one.  She took her scheduled hs medications and is currently resting with her eyes closed and appears to be asleep. A:  1:1 with RN for support and encouragement.  Medications as ordered.  Q 15 minute checks maintained for safety.  Encouraged participation in group and unit activities.   R:  Madeline Shaw remains safe on the unit.  We will continue to monitor the progress towards her goals.

## 2018-07-22 NOTE — Progress Notes (Signed)
Recreation Therapy Notes  Date: 11.20.19 Time: 0930 Location: 300 Hall Dayroom  Group Topic: Stress Management  Goal Area(s) Addresses:  Patient will verbalize importance of using healthy stress management.  Patient will identify positive emotions associated with healthy stress management.   Intervention: Stress Management  Activity :  Guided Imagery.  LRT introduced the stress management technique of guided imagery.  LRT read a script to guide patients through a peaceful meadow.  Patients were to follow along as script was read to engage in activity.  Education:  Stress Management, Discharge Planning.   Education Outcome: Acknowledges edcuation/In group clarification offered/Needs additional education  Clinical Observations/Feedback: Pt did not attend group.    Victorino Sparrow, LRT/CTRS         Victorino Sparrow A 07/22/2018 11:24 AM

## 2018-07-22 NOTE — Progress Notes (Signed)
Patient ID: Madeline Shaw, female   DOB: 1976-08-18, 42 y.o.   MRN: [id number]  1:1 Initial Note  Patient was placed on 1:1 observation due to being a high fall risk. Writer was attempting to get patient into a wheelchair, but patient was resisting, falling sideways, and not complying. Patient was deliberately walking unsteady.  Patient is currently sitting on the edge of her bed (which is currently on the floor).  Sitter is within arms reach.

## 2018-07-22 NOTE — Plan of Care (Signed)
  Problem: Activity: Goal: Sleeping patterns will improve Outcome: Progressing Note:  Per chart she slept 6 hours last night and is currently resting and appears to be asleep.

## 2018-07-22 NOTE — Progress Notes (Signed)
Patient self inventory- Patient slept fair last night, sleep medication was requested. Appetite poor, concentration poor. Depression and hopelessness rated 3 and 10, out of 10. Patient claims to endorse tremors, diarrhea, cravings, chilling, cramping, nausea, and irritability. Denies SI HI AVH. Patinet also endorses lightheadedness, dizziness, headaches, and blurred vision. Patient wrote "hopefully detox will be easier."  Patient is compliant with medications prescribed per provider. No side effects noted.  Safety is maintained with 15 minute checks as well as environmental checks. Will continue to monitor and provide support. Patient was asking for Ativan this morning during medication pass. When patient was told Ativan was d/c'd, patient became upset and said "I don't understand why they'd do that." Patient said she was having withdrawal symptoms. Writer asked patient what her symptoms were and she said "irritability, dizziness, and blurred vision." Writer explained those aren't w/d symptoms, and patient continued to state how anxious she is, but even more so due to med change. Writer told patient she needs to figure out other coping skills besides med seeking.

## 2018-07-22 NOTE — Progress Notes (Signed)
D: Pt denies SI/HI/AVH. Pt is pleasant and cooperative. Pt stated she was feeling better , pt stated she really wanted to get off the Ativan. Time spent with writer discussing coping mechanisms for anxiety and other life events.   A: Pt was offered support and encouragement. Pt was given scheduled medications. Pt was encourage to attend groups. Q 15 minute checks were done for safety.   R: Pt is taking medication. Pt has no complaints.Pt receptive to treatment and safety maintained on unit.  Problem: Spiritual Needs Goal: Ability to function at adequate level Outcome: Progressing   Problem: Education: Goal: Emotional status will improve Outcome: Progressing   Problem: Education: Goal: Mental status will improve Outcome: Progressing

## 2018-07-22 NOTE — Plan of Care (Signed)
Patient needs to learn to deal with anxiety other than with medications. Patient said she does not know how to cope without Ativan.  Problem: Activity: Goal: Sleeping patterns will improve Outcome: Progressing   Problem: Safety: Goal: Periods of time without injury will increase Outcome: Progressing   Problem: Education: Goal: Emotional status will improve Outcome: Not Progressing Goal: Mental status will improve Outcome: Not Progressing   Problem: Education: Goal: Ability to make informed decisions regarding treatment will improve Outcome: Not Progressing   Problem: Coping: Goal: Coping ability will improve Outcome: Not Progressing
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
## 2018-07-22 NOTE — [facility] Group Notes (Signed)
[place] Group Notes:  (Nursing/MHT/Case Management/Adjunct)  Date:  07/22/2018  Time:  7:55 PM  Type of Therapy:  Nurse Education  Participation Level:  Did Not Attend  Summary of Progress/Problems: RN led group on "Needs Assessment." We discussed positive self affirmation, the triangle of "emotions," "actions," and "thoughts", and Maslow's hierarchy of needs. We discussed the importance of meeting needs in a healthy manner.   Lesli Albee 07/22/2018, 7:55 PM

## 2018-07-23 ENCOUNTER — Ambulatory Visit: Payer: Self-pay | Admitting: Medical

## 2018-07-23 DIAGNOSIS — Z0289 Encounter for other administrative examinations: Secondary | ICD-10-CM

## 2018-07-23 DIAGNOSIS — F1310 Sedative, hypnotic or anxiolytic abuse, uncomplicated: Secondary | ICD-10-CM

## 2018-07-23 LAB — GLUCOSE, CAPILLARY: GLUCOSE-CAPILLARY: 117 mg/dL — AB (ref 70–99)

## 2018-07-23 MED ORDER — GABAPENTIN 100 MG PO CAPS
200.0000 mg | ORAL_CAPSULE | Freq: Three times a day (TID) | ORAL | Status: DC
  Administered 2018-07-23 – 2018-07-24 (×3): 200 mg via ORAL
  Filled 2018-07-23 (×3): qty 2
  Filled 2018-07-23 (×3): qty 42
  Filled 2018-07-23 (×3): qty 2

## 2018-07-23 MED ORDER — OLANZAPINE 5 MG PO TBDP
5.0000 mg | ORAL_TABLET | Freq: Every day | ORAL | Status: DC
  Filled 2018-07-23: qty 1
  Filled 2018-07-23: qty 7
  Filled 2018-07-23: qty 1

## 2018-07-23 NOTE — Progress Notes (Signed)
Patient 1:1 discontinued.  Patient has been able to maintain safety without a sitter.  Patient has had no incidents of falls and is mobile with wheelchair assistance independently. Continue to monitor for safety.

## 2018-07-23 NOTE — [facility] Group Notes (Signed)
Pt did not attend wrap up group this evening. Pt stayed in their room instead 

## 2018-07-23 NOTE — Progress Notes (Signed)
Recreation Therapy Notes  Date: 11.21.19 Time: 1000 Location: [address] Dayroom   Group Topic: Communication, Team Building, Problem Solving  Goal Area(s) Addresses:  Patient will effectively work with peer towards shared goal.  Patient will identify skill used to make activity successful.  Patient will identify how skills used during activity can be used to reach post d/c goals.   Intervention: STEM Activity   Activity: [employer]. Patients were provided the following materials: 5 drinking straws, 5 rubber bands, 5 paper clips, 2 index cards, 2 drinking cups, and 2 toilet paper rolls. Using the provided materials patients were asked to build a launching mechanisms to launch a ping pong ball approximately 12 feet. Patients were divided into teams of 3-5.   Education: Education officer, community, Dentist.   Education Outcome: Acknowledges education/In group clarification offered/Needs additional education.   Clinical Observations/Feedback: Pt did not attend group.     Victorino Sparrow, LRT/CTRS         Victorino Sparrow A 07/23/2018 11:31 AM

## 2018-07-23 NOTE — Progress Notes (Signed)
[facility] MD Progress Note  07/23/2018 10:00 AM Madeline Shaw  MRN:  [id number] Subjective:    Patient displayed unsteadiness yesterday and there was some volitional behavior regarding potential falls therefore she was put on one-to-one precautions.  She would like to be off of these precautions states she has recovered her balance and think she will be fine. She elaborated her case she reported long-term Ativan dependency and overtaking Ativan and has had seizures in the past but her detox is complete she has no acute withdrawal symptoms no thoughts of harming self or others. Discussed the episode of unsteadiness again there was probably a volitional component but her blood pressure was low so we will go ahead and lower the Zyprexa to be on the safe side. Principal Problem: Bipolar I disorder, most recent episode depressed, severe w psychosis ([place]) Diagnosis: Principal Problem:   Bipolar I disorder, most recent episode depressed, severe w psychosis ([place]) Active Problems:   Severe benzodiazepine use disorder ([place])  Total Time spent with patient: 15 minutes   Past Medical History:  Past Medical History:  Diagnosis Date  . Anxiety   . Cancer ([place])   . Depression   . Gall stones   . Seizures ([place])   . Vertigo     Past Surgical History:  Procedure Laterality Date  . ABDOMINAL HYSTERECTOMY     Family History: History reviewed. No pertinent family history.  Social History:  Social History   Substance and Sexual Activity  Alcohol Use Not Currently   Comment: states sober for 1.5 years     Social History   Substance and Sexual Activity  Drug Use Yes  . Types: Benzodiazepines   Comment: benzo's    Social History   Socioeconomic History  . Marital status: Legally Separated    Spouse name: Not on file  . Number of children: Not on file  . Years of education: Not on file  . Highest education level: Not on file  Occupational History  . Not on file  Social Needs  .  Financial resource strain: Somewhat hard  . Food insecurity:    Worry: Patient refused    Inability: Patient refused  . Transportation needs:    Medical: Patient refused    Non-medical: Patient refused  Tobacco Use  . Smoking status: Current Every Day Smoker    Packs/day: 1.00    Types: Cigarettes  . Smokeless tobacco: Never Used  . Tobacco comment: Pt declined cessation teaching  Substance and Sexual Activity  . 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  . Drug use: Yes    Types: Benzodiazepines    Comment: benzo's  . Sexual activity: Yes    Birth control/protection: Surgical  Lifestyle  . Physical activity:    Days per week: Patient refused    Minutes per session: Patient refused  . Stress: Rather much  Relationships  . Social connections:    Talks on phone: Patient refused    Gets together: Patient refused    Attends religious service: Patient refused    Active member of club or organization: Patient refused    Attends meetings of clubs or organizations: Patient refused    Relationship status: Patient refused  Other Topics Concern  . Not on file  Social History Narrative  . Not on file   Additional Social History:    Pain Medications: see MAR Prescriptions: see MAR Over the Counter: see MAR History of alcohol / drug use?: Yes  Longest period of sobriety (when/how long): patient denied abusing drugs Name of Substance 1: ativan 1 - Age of First Use: Hospital staff reports that she has been abusing her benzodiazepines, patient denies abusing                  Sleep: Fair  Appetite:  Fair  Current Medications: Current Facility-Administered Medications  Medication Dose Route Frequency Provider Last Rate Last Dose  . alum & mag hydroxide-simeth (MAALOX/MYLANTA) 200-200-20 MG/5ML suspension 30 mL  30 mL Oral Q4H PRN Derrill Center, NP      . dimenhyDRINATE (DRAMAMINE) tablet 50 mg  50 mg Oral Q8H PRN Pennelope Bracken, MD   50  mg at 07/23/18 0743  . escitalopram (LEXAPRO) tablet 10 mg  10 mg Oral Daily Pennelope Bracken, MD   10 mg at 07/23/18 0743  . feeding supplement (ENSURE ENLIVE) (ENSURE ENLIVE) liquid 237 mL  237 mL Oral BID BM Johnn Hai, MD   237 mL at 07/21/18 1427  . gabapentin (NEURONTIN) capsule 100 mg  100 mg Oral TID Pennelope Bracken, MD   100 mg at 07/23/18 0742  . hydrOXYzine (ATARAX/VISTARIL) tablet 50 mg  50 mg Oral Q6H PRN Pennelope Bracken, MD   50 mg at 07/22/18 2122  . ibuprofen (ADVIL,MOTRIN) tablet 600 mg  600 mg Oral Q6H PRN Pennelope Bracken, MD      . lamoTRIgine (LAMICTAL) tablet 200 mg  200 mg Oral BID Cobos, Myer Peer, MD   200 mg at 07/23/18 0743  . magnesium hydroxide (MILK OF MAGNESIA) suspension 30 mL  30 mL Oral Daily PRN Derrill Center, NP      . metroNIDAZOLE (FLAGYL) tablet 500 mg  500 mg Oral Q12H Nwoko, Agnes I, NP   500 mg at 07/23/18 0744  . multivitamin with minerals tablet 1 tablet  1 tablet Oral Daily Cobos, Myer Peer, MD   1 tablet at 07/23/18 0744  . nicotine (NICODERM CQ - dosed in mg/24 hours) patch 21 mg  21 mg Transdermal Q24H Derrill Center, NP   21 mg at 07/22/18 1736  . OLANZapine zydis (ZYPREXA) disintegrating tablet 5 mg  5 mg Oral QHS Johnn Hai, MD      . thiamine (VITAMIN B-1) tablet 100 mg  100 mg Oral Daily Cobos, Myer Peer, MD   100 mg at 07/23/18 0744    Lab Results: No results found for this or any previous visit (from the past 48 hour(s)).  Blood Alcohol level:  Lab Results  Component Value Date   ETH <10 91/47/8295    Metabolic Disorder Labs: No results found for: HGBA1C, MPG No results found for: PROLACTIN No results found for: CHOL, TRIG, HDL, CHOLHDL, VLDL, LDLCALC  Physical Findings: AIMS: Facial and Oral Movements Muscles of Facial Expression: None, normal Lips and Perioral Area: None, normal Jaw: None, normal Tongue: None, normal,Extremity Movements Upper (arms, wrists, hands, fingers): None,  normal Lower (legs, knees, ankles, toes): None, normal, Trunk Movements Neck, shoulders, hips: None, normal, Overall Severity Severity of abnormal movements (highest score from questions above): None, normal Incapacitation due to abnormal movements: None, normal Patient's awareness of abnormal movements (rate only patient's report): No Awareness, Dental Status Current problems with teeth and/or dentures?: No Does patient usually wear dentures?: No  CIWA:  CIWA-Ar Total: 4 COWS:  COWS Total Score: 4  Musculoskeletal: Strength & Muscle Tone: within normal limits Gait & Station: normal Patient leans: N/A  Psychiatric Specialty Exam: Physical  Exam  ROS  Blood pressure 137/87, pulse 76, temperature 98.7 F (37.1 C), temperature source Oral, resp. rate 16, height 5\' 1"  (1.549 m), weight 46.3 kg, SpO2 98 %.Body mass index is 19.27 kg/m.  General Appearance: Casual  Eye Contact:  Good  Speech:  Clear and Coherent  Volume:  nl  Mood:  Euthymic  Affect:  Congruent  Thought Process:  Goal Directed  Orientation:  Full (Time, Place, and Person)  Thought Content:  Logical  Suicidal Thoughts:  No  Homicidal Thoughts:  No  Memory:  Immediate;   Fair  Judgement:  Fair  Insight:  Fair  Psychomotor Activity:  Decreased  Concentration:  Concentration: Fair  Recall:  [employer] of Knowledge:  Fair  Language:  Good  Akathisia:  Negative  Handed:  Right  AIMS (if indicated):     Assets:  Desire for Improvement  ADL's:  Intact  Cognition:  WNL  Sleep:  Number of Hours: 6.25    Probable discharge tomorrow as discussed by treatment team Treatment Plan Summary: Daily contact with patient to assess and evaluate symptoms and progress in treatment  Tangala Wiegert, MD 07/23/2018, 10:00 AM

## 2018-07-23 NOTE — Progress Notes (Signed)
Nursing 1:1 note D:Pt observed sleeping in bed with eyes closed. RR even and unlabored. No distress noted. A: 1:1 observation continues for safety  R: pt remains safe  

## 2018-07-23 NOTE — Progress Notes (Signed)
D: Pt denies SI/HI/AVH. Pt is pleasant and cooperative. Pt has isolated to her room this evening.  A: Pt was offered support and encouragement. . Pt was encourage to attend groups. Q 15 minute checks were done for safety.  R: safety maintained on unit.  Problem: Education: Goal: Emotional status will improve Outcome: Progressing   Problem: Activity: Goal: Sleeping patterns will improve Outcome: Progressing   Problem: Coping: Goal: Ability to demonstrate self-control will improve Outcome: Progressing

## 2018-07-23 NOTE — Progress Notes (Signed)
Nursing 1:1 note D:Pt observed sitting on mattress on floor. RR even and unlabored. No distress noted. A: 1:1 observation continues for safety  R: pt remains safe

## 2018-07-24 MED ORDER — METRONIDAZOLE 500 MG PO TABS: 500.0000 mg | ORAL_TABLET | Freq: Two times a day (BID) | ORAL | 0 refills | Status: AC

## 2018-07-24 MED ORDER — GABAPENTIN 100 MG PO CAPS: 200.0000 mg | ORAL_CAPSULE | Freq: Three times a day (TID) | ORAL | 0 refills | Status: DC

## 2018-07-24 MED ORDER — LAMOTRIGINE 200 MG PO TABS: 200.0000 mg | ORAL_TABLET | Freq: Two times a day (BID) | ORAL | 0 refills | Status: DC

## 2018-07-24 MED ORDER — HYDROXYZINE HCL 50 MG PO TABS: 50.0000 mg | ORAL_TABLET | Freq: Four times a day (QID) | ORAL | 0 refills | Status: DC | PRN

## 2018-07-24 MED ORDER — OLANZAPINE 5 MG PO TBDP: 5.0000 mg | ORAL_TABLET | Freq: Every day | ORAL | 0 refills | Status: DC

## 2018-07-24 MED ORDER — ESCITALOPRAM OXALATE 10 MG PO TABS: 10.0000 mg | ORAL_TABLET | Freq: Every day | ORAL | 0 refills | Status: DC

## 2018-07-24 NOTE — BHH Suicide Risk Assessment (Signed)
[facility] Discharge Suicide Risk Assessment   Principal Problem: Bipolar I disorder, most recent episode depressed, severe w psychosis ([place]) Discharge Diagnoses: Principal Problem:   Bipolar I disorder, most recent episode depressed, severe w psychosis ([place]) Active Problems:   Severe benzodiazepine use disorder ([place])   Total Time spent with patient: 15 minutes  Musculoskeletal: Strength & Muscle Tone: within normal limits Gait & Station: normal Patient leans: N/A  Psychiatric Specialty Exam: Review of Systems  Neurological: Positive for dizziness.  All other systems reviewed and are negative.   Blood pressure 106/62, pulse 68, temperature 98.3 F (36.8 C), temperature source Oral, resp. rate 16, height 5\' 1"  (1.549 m), weight 46.3 kg, SpO2 98 %.Body mass index is 19.27 kg/m.  General Appearance: Casual  Eye Contact::  Fair  Speech:  Normal Rate409  Volume:  Normal  Mood:  Anxious  Affect:  Congruent  Thought Process:  Coherent and Descriptions of Associations: Intact  Orientation:  Full (Time, Place, and Person)  Thought Content:  Logical  Suicidal Thoughts:  No  Homicidal Thoughts:  No  Memory:  Immediate;   Fair Recent;   Fair Remote;   Fair  Judgement:  Intact  Insight:  Lacking  Psychomotor Activity:  Normal  Concentration:  Fair  Recall:  [employer] of Knowledge:Fair  Language: Good  Akathisia:  Negative  Handed:  Right  AIMS (if indicated):     Assets:  Desire for Improvement Housing Resilience Social Support  Sleep:  Number of Hours: 9.75  Cognition: WNL  ADL's:  Intact   Mental Status Per Nursing Assessment::   On Admission:  Suicidal ideation indicated by patient, Suicide plan, Plan includes specific time, place, or method, Self-harm thoughts  Demographic Factors:  Caucasian, Low socioeconomic status and Unemployed  Loss Factors: NA  Historical Factors: Impulsivity  Risk Reduction Factors:   Living with another person, especially a  relative  Continued Clinical Symptoms:  Bipolar Disorder:   Depressive phase  Cognitive Features That Contribute To Risk:  Closed-mindedness    Suicide Risk:  Minimal: No identifiable suicidal ideation.  Patients presenting with no risk factors but with morbid ruminations; may be classified as minimal risk based on the severity of the depressive symptoms  Follow-up [place] Follow up on 07/27/2018.   Why:  The patient reports her next appointment is between 2-3pm on 07/27/18 and she herself will call to confirm, does not want hospital CSW involved.  She declines to sign a Release of Information and wishes to convey information to her doctor directly. Contact information: [place] [address] [phone]           Plan Of Care/Follow-up recommendations:  Activity:  ad lib  Sharma Covert, MD 07/24/2018, 9:47 AM
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
## 2018-07-24 NOTE — Progress Notes (Signed)
  [facility] Adult Case Management Discharge Plan :  Will you be returning to the same living situation after discharge:  Yes,  own home At discharge, do you have transportation home?: Yes,  family Do you have the ability to pay for your medications: No. Will work with [employer].  Release of information consent forms completed and in the chart;  Patient's signature needed at discharge.  Patient to Follow up at: Follow-up [facility], [facility] Follow up on 07/27/2018.   Why:  The patient reports her next appointment is between 2-3pm on 07/27/18 and she herself will call to confirm, does not want hospital CSW involved.  She declines to sign a Release of Information and wishes to convey information to her doctor directly. Contact information: [place] [address] [phone]           Next level of care provider has access to [place] and Suicide Prevention discussed: No. Pt declined consent.   Have you used any form of tobacco in the last 30 days? (Cigarettes, Smokeless Tobacco, Cigars, and/or Pipes): Yes  Has patient been referred to the Quitline?: Patient refused referral  Patient has been referred for addiction treatment: Pt. refused referral  Joanne Chars, [place] 07/24/2018, 10:34 AM

## 2018-07-24 NOTE — Progress Notes (Signed)
Patient ID: Madeline Shaw, female   DOB: Oct 20, 1975, 42 y.o.   MRN: [id number] Patient discharged to home self care in the presence of her son.  Patient denies SI, HI and AVH upon discharge.  Patient acknowledged understanding of all discharge instructions. Patient acknowledged receipt of personal belongings.

## 2018-07-24 NOTE — [facility] Group Notes (Signed)
spiritual care group led by chaplain Jerene Pitch, MDiv, [place].    Group Description: Focused on Topic of Hope.  Pt's participated in facilitated dialog around meaning of hope in their lives.  Utilized Arboriculturist cards to share an image of hope for them over the next day.     Patient Participation:  Madeline Shaw was invited.  Did not attend.

## 2018-07-24 NOTE — Progress Notes (Signed)
Recreation Therapy Notes  Date: 07/24/18 Time: 1000 Location: 500 Hall Dayroom  Group Topic: Stress Management  Goal Area(s) Addresses:  Patient will verbalize importance of using healthy stress management.  Patient will identify positive emotions associated with healthy stress management.   Intervention: Stress Management  Activity :  Meditation.  LRT introduced the stress management technique of meditation to the patients.  LRT played a meditation that focused on healing and letting go.  Patients were to follow along as the meditation played to engage in the activity.  Education: Stress Management, Discharge Planning.   Education Outcome: Acknowledges edcuation/In group clarification offered/Needs additional education  Clinical Observations/Feedback: Pt did not attend group.    Victorino Sparrow, LRT/CTRS         Victorino Sparrow A 07/24/2018 1:33 PM

## 2019-01-16 IMAGING — CT CT ABD-PELV W/ CM
2 of 5 series · 16 of 46 positions shown, 18 images · IV contrast (Omni 300)
Comparison: None.

CLINICAL DATA: Abdominal pain with gastrointestinal bleeding.
Vomiting and nausea for 3 days.

EXAM:
CT ABDOMEN AND PELVIS WITH CONTRAST
TECHNIQUE: Multidetector CT imaging of the abdomen and pelvis was performed
using the standard protocol following bolus administration of
intravenous contrast.
CONTRAST:  100mL [id number] IOPAMIDOL ([id number]) INJECTION 61%

[Series 3: a/p w/ 5mm · axial · 0.76mm/px · z∈[+817,+1212]mm · 13 of 89 slices shown, 15 images]
[im 5/89  soft-tissue]
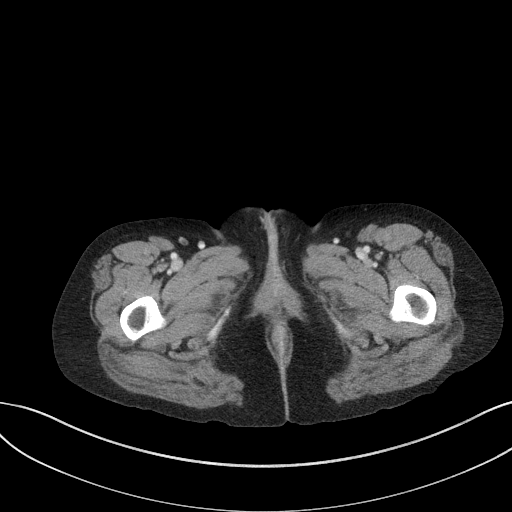
[im 5/89  bone]
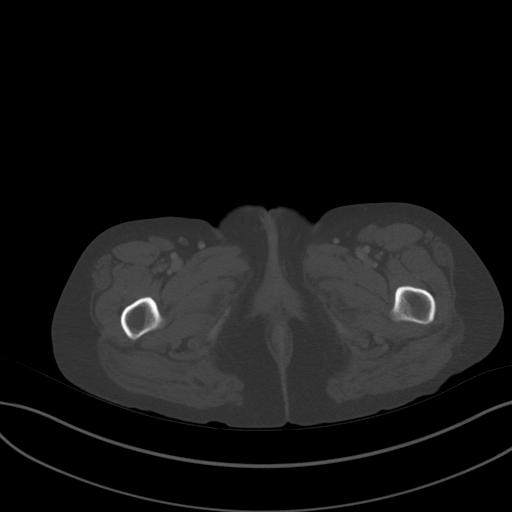
[im 14/89  soft-tissue]
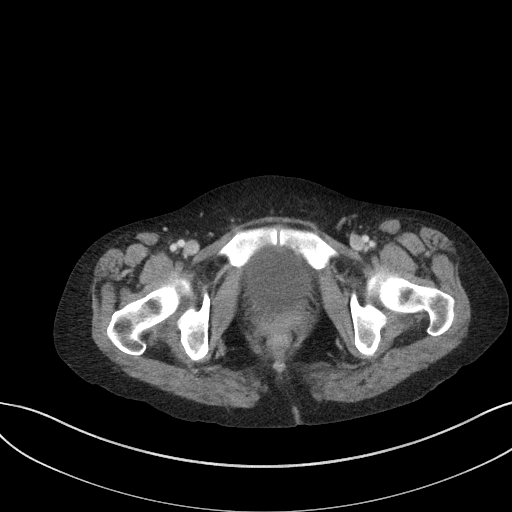
[im 19/89  soft-tissue]
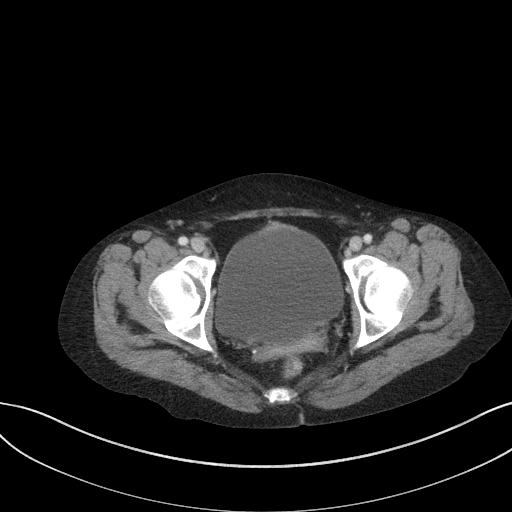
[im 24/89  soft-tissue]
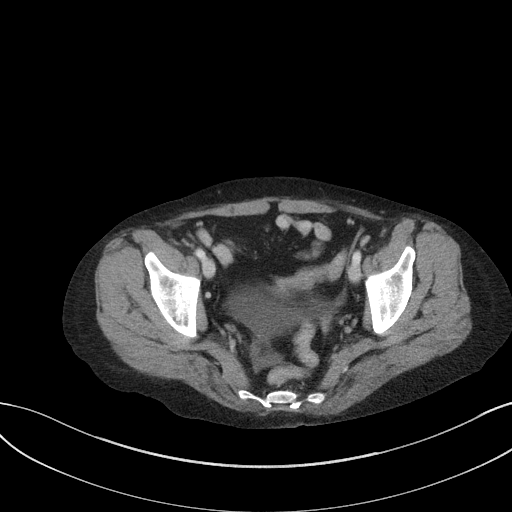
[im 33/89  soft-tissue]
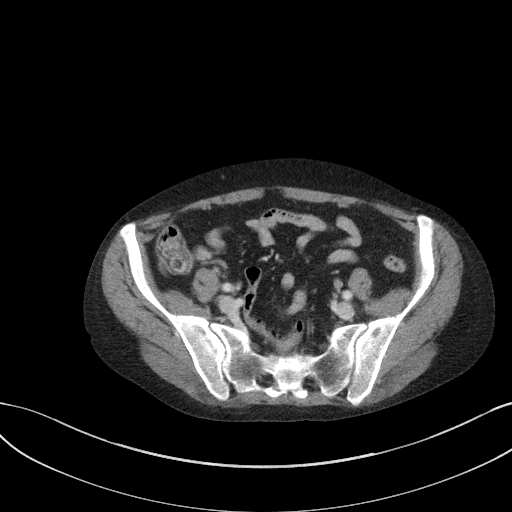
[im 38/89  soft-tissue]
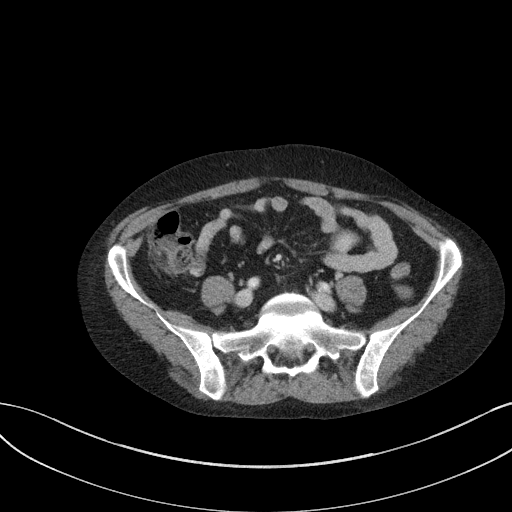
[im 47/89  soft-tissue]
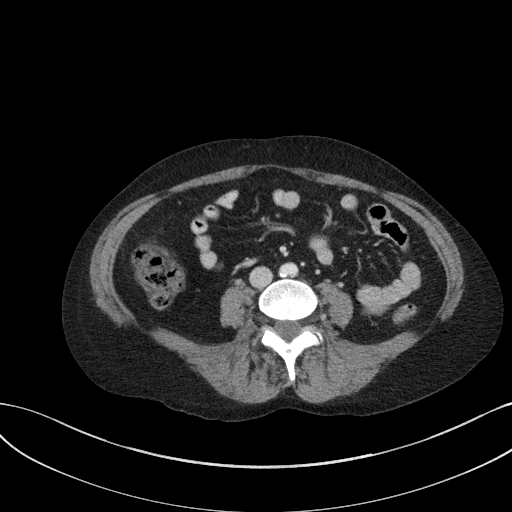
[im 51/89  soft-tissue]
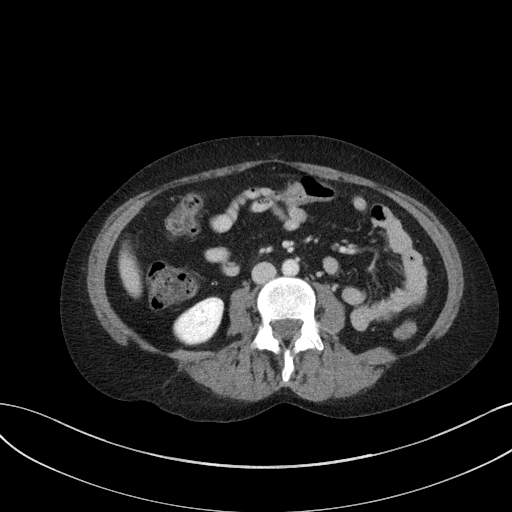
[im 56/89  soft-tissue]
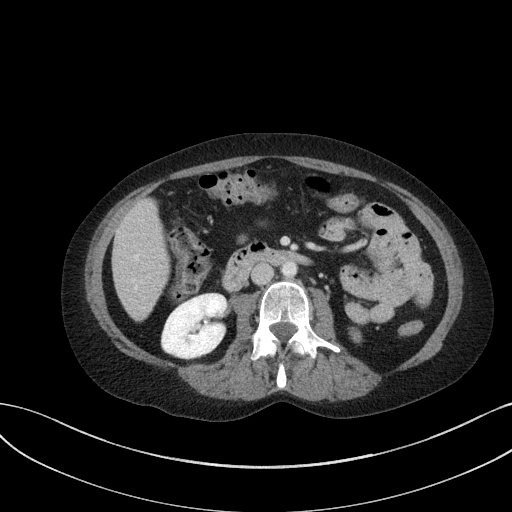
[im 56/89  bone]
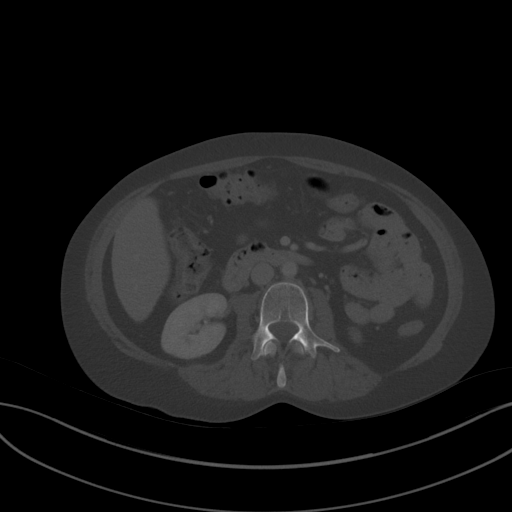
[im 65/89  soft-tissue]
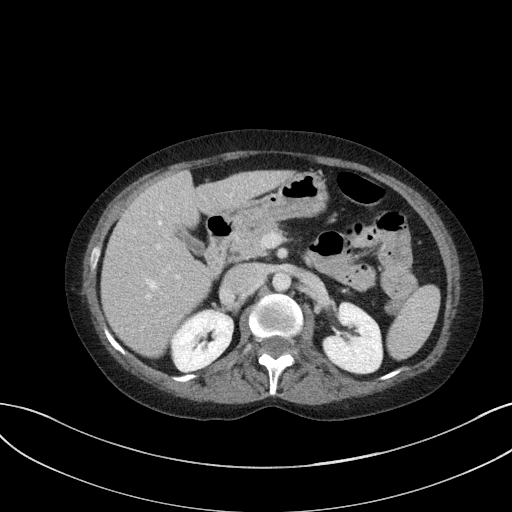
[im 70/89  soft-tissue]
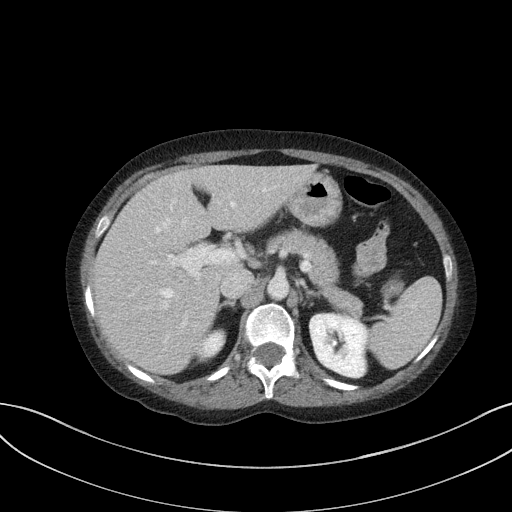
[im 75/89  soft-tissue]
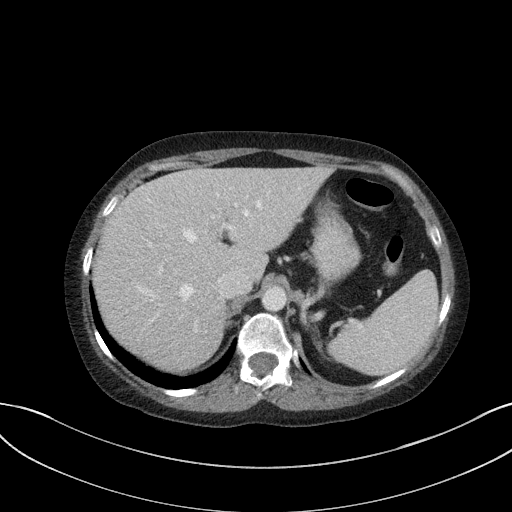
[im 84/89  soft-tissue]
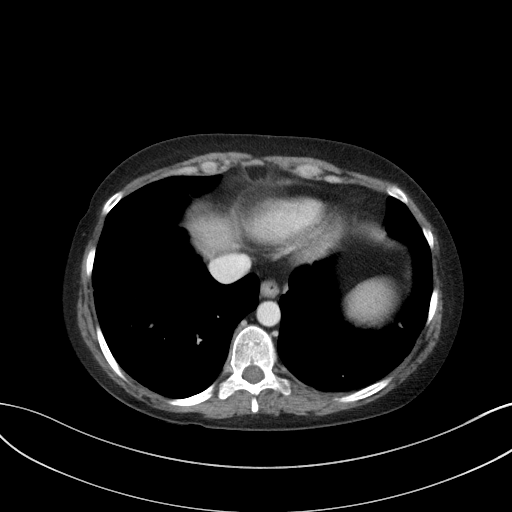

[Series 6: a/p w/ cor · coronal · 0.83mm/px · 3 of 134 slices shown]
[im 45/134  soft-tissue]
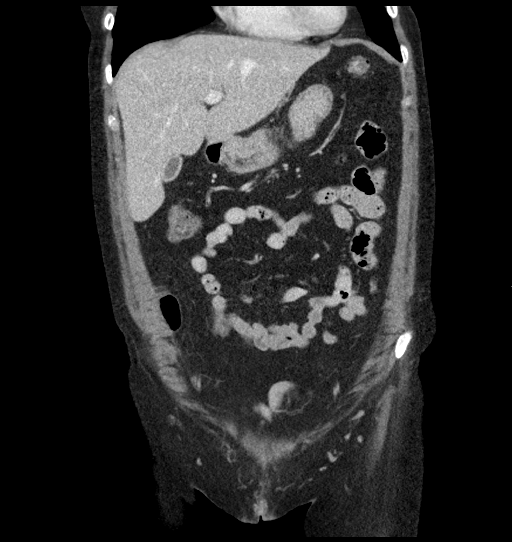
[im 60/134  soft-tissue]
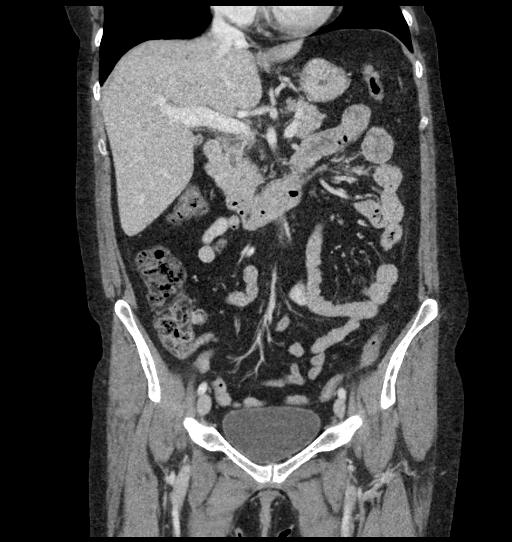
[im 74/134  soft-tissue]
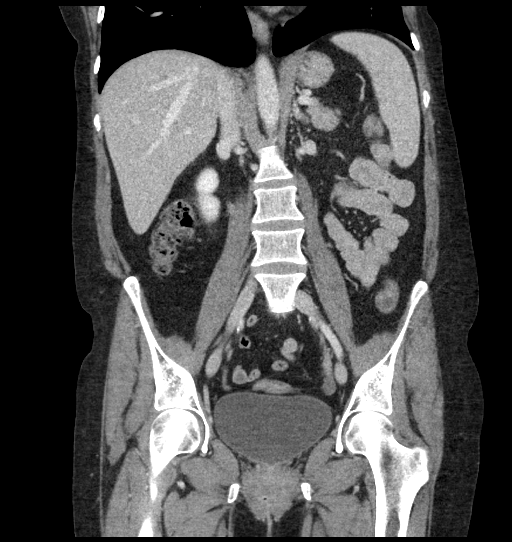

[16 of 46 positions shown; findings below may reference images not displayed]

FINDINGS: Lower chest: [DATE] by 0.5 by 0.5 cm subpleural nodule in the right
lower lobe on image [DATE].

Dependent subsegmental atelectasis present in both lower lobes.

Hepatobiliary: 0.9 by 0.5 by 0.6 cm hypodense lesion in the right
hepatic lobe on image [DATE]. 4 mm segment IV a hypodense lesion on
image [DATE].

Contracted gallbladder.

Pancreas: Unremarkable

Spleen: Subtle linear hypodensity along the posterior spleen is
likely due to a cleft given the lack of appreciable perisplenic
fluid.

Adrenals/Urinary Tract: Unremarkable

Stomach/Bowel: No obvious gastric or duodenal abnormality on CT.
Appendix normal.

Vascular/Lymphatic: Aortoiliac atherosclerotic vascular disease. No
pathologic adenopathy identified.

Reproductive: Appears absent. Left ovary unremarkable. Right ovary
poorly seen.

Other: Small amount of free pelvic fluid slightly eccentric to the
right in the cul-de-sac region.

Musculoskeletal: Unremarkable
IMPRESSION: 1. A specific cause for the patient's abdominal pain and
gastrointestinal bleeding is not identified.
2. Several small nonspecific hepatic lesions are observed as
detailed above. Although statistically likely to be benign these are
technically nonspecific due to small size.
3.  Aortic Atherosclerosis (MR2V3-ALU.U).
4. Trace amount of free pelvic fluid eccentric to the right in the
cul-de-sac region, significance uncertain.
5. Surgical absence of the uterus. Nonvisualization of the right
ovary.
6. The appendix appears normal.

## 2023-03-23 ENCOUNTER — Emergency Department (HOSPITAL_COMMUNITY)
Admission: EM | Admit: 2023-03-23 | Discharge: 2023-03-23 | Disposition: A | Discharge status: Home / Self Care | Payer: Medicaid Other | Attending: Student | Admitting: Student

## 2023-03-23 ENCOUNTER — Encounter (HOSPITAL_COMMUNITY): Payer: Self-pay

## 2023-03-23 ENCOUNTER — Emergency Department (HOSPITAL_COMMUNITY): Payer: Medicaid Other

## 2023-03-23 ENCOUNTER — Other Ambulatory Visit: Payer: Self-pay

## 2023-03-23 DIAGNOSIS — R519 Headache, unspecified: Secondary | ICD-10-CM | POA: Diagnosis not present

## 2023-03-23 DIAGNOSIS — R569 Unspecified convulsions: Secondary | ICD-10-CM | POA: Diagnosis present

## 2023-03-23 DIAGNOSIS — G8929 Other chronic pain: Secondary | ICD-10-CM

## 2023-03-23 HISTORY — DX: Cerebral infarction, unspecified: I63.9

## 2023-03-23 LAB — CBC
HCT: 39.4 % (ref 36.0–46.0)
Hemoglobin: 13 g/dL (ref 12.0–15.0)
MCH: 27.9 pg (ref 26.0–34.0)
MCHC: 33 g/dL (ref 30.0–36.0)
MCV: 84.5 fL (ref 80.0–100.0)
Platelets: 184 10*3/uL (ref 150–400)
RBC: 4.66 MIL/uL (ref 3.87–5.11)
RDW: 12.4 % (ref 11.5–15.5)
WBC: 5.1 10*3/uL (ref 4.0–10.5)
nRBC: 0 % (ref 0.0–0.2)

## 2023-03-23 LAB — BASIC METABOLIC PANEL
Anion gap: 10 (ref 5–15)
BUN: 6 mg/dL (ref 6–20)
CO2: 23 mmol/L (ref 22–32)
Calcium: 9.2 mg/dL (ref 8.9–10.3)
Chloride: 104 mmol/L (ref 98–111)
Creatinine, Ser: 0.98 mg/dL (ref 0.44–1.00)
GFR, Estimated: >60 mL/min (ref >60)
Glucose, Bld: 105 mg/dL — ABNORMAL HIGH (ref 70–99)
Potassium: 3.7 mmol/L (ref 3.5–5.1)
Sodium: 137 mmol/L (ref 135–145)

## 2023-03-23 LAB — URINALYSIS, W/ REFLEX TO CULTURE (INFECTION SUSPECTED)
Bilirubin Urine: NEGATIVE
Glucose, UA: NEGATIVE mg/dL
Hgb urine dipstick: NEGATIVE
Ketones, ur: NEGATIVE mg/dL
Nitrite: NEGATIVE
Protein, ur: NEGATIVE mg/dL
Specific Gravity, Urine: 1.004 — ABNORMAL LOW (ref 1.005–1.030)
pH: 6 (ref 5.0–8.0)

## 2023-03-23 LAB — HEPATIC FUNCTION PANEL
ALT: 18 U/L (ref 0–44)
AST: 20 U/L (ref 15–41)
Albumin: 4.2 g/dL (ref 3.5–5.0)
Alkaline Phosphatase: 123 U/L (ref 38–126)
Bilirubin, Direct: 0.1 mg/dL (ref 0.0–0.2)
Indirect Bilirubin: 0.4 mg/dL (ref 0.3–0.9)
Total Bilirubin: 0.5 mg/dL (ref 0.3–1.2)
Total Protein: 7.3 g/dL (ref 6.5–8.1)

## 2023-03-23 LAB — MAGNESIUM: Magnesium: 1.9 mg/dL (ref 1.7–2.4)

## 2023-03-23 LAB — CK: Total CK: 147 U/L (ref 38–234)

## 2023-03-23 LAB — TROPONIN I (HIGH SENSITIVITY)
Troponin I (High Sensitivity): 17 ng/L (ref <18)
Troponin I (High Sensitivity): 17 ng/L (ref <18)

## 2023-03-23 MED ORDER — PROCHLORPERAZINE EDISYLATE 10 MG/2ML IJ SOLN
10.0000 mg | Freq: Once | INTRAMUSCULAR | Status: AC
  Administered 2023-03-23: 10 mg via INTRAVENOUS
  Filled 2023-03-23: qty 2

## 2023-03-23 MED ORDER — DIPHENHYDRAMINE HCL 50 MG/ML IJ SOLN
25.0000 mg | Freq: Once | INTRAMUSCULAR | Status: AC
  Administered 2023-03-23: 25 mg via INTRAVENOUS
  Filled 2023-03-23: qty 1

## 2023-03-23 MED ORDER — HYDROCODONE-ACETAMINOPHEN 5-325 MG PO TABS
1.0000 | ORAL_TABLET | Freq: Once | ORAL | Status: AC
  Administered 2023-03-23: 1 via ORAL
  Filled 2023-03-23: qty 1

## 2023-03-23 MED ORDER — KETOROLAC TROMETHAMINE 15 MG/ML IJ SOLN
15.0000 mg | Freq: Once | INTRAMUSCULAR | Status: AC
  Administered 2023-03-23: 15 mg via INTRAVENOUS
  Filled 2023-03-23: qty 1

## 2023-03-23 MED ORDER — ONDANSETRON HCL 4 MG/2ML IJ SOLN
4.0000 mg | Freq: Once | INTRAMUSCULAR | Status: AC
  Administered 2023-03-23: 4 mg via INTRAVENOUS
  Filled 2023-03-23: qty 2

## 2023-03-23 MED ORDER — GABAPENTIN 400 MG PO CAPS: 400.0000 mg | ORAL_CAPSULE | Freq: Three times a day (TID) | ORAL | 1 refills | Status: DC

## 2023-03-23 MED ORDER — LACTATED RINGERS IV BOLUS
1000.0000 mL | Freq: Once | INTRAVENOUS | Status: AC
  Administered 2023-03-23: 1000 mL via INTRAVENOUS

## 2023-03-23 NOTE — ED Notes (Signed)
Pt refusing to provide urine sample.

## 2023-03-23 NOTE — Discharge Instructions (Addendum)
You are seen today in the emergency department for workup of your increased seizure-like activity.  While you are here we gave you medications, performed imaging, monitored your vital signs, and performed physical exam.  These were all reassuring and did not indicate any further need for testing while you are in the emergency department or any further intervention.  As we discussed, I spoke with our neurologist who recommended that we go up on your gabapentin to 400 mg 3 times a day.  I sent this to your pharmacy.  Please follow-up with your neurology provider soon as you are able.  I have put her phone number on this paperwork.  Please ask her about Mnire's disease.  It is very important that you do not drive a vehicle since you have had a seizure in the last 6 months.  Return to the emergency department if you have any new or worsening symptoms.
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
## 2023-03-23 NOTE — ED Triage Notes (Signed)
Pt states she has been having seizures and two days ago she woke up on the floor and thinks she had a seizure then. Pt c/o head pain, blurred vision and vertigo intx2wks. Pt c/o midsternal chest painx2d.

## 2023-03-23 NOTE — ED Notes (Signed)
On arrival to ED room, pt requesting purwick. Pt notified that we can get a wheelchair or use a bedpan when she needs to use the restroom.
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
## 2023-03-24 LAB — URINE CULTURE: Culture: NO GROWTH

## 2023-03-25 LAB — LAMOTRIGINE LEVEL: Lamotrigine Lvl: 13.7 ug/mL (ref 2.0–20.0)

## 2023-03-26 ENCOUNTER — Encounter (HOSPITAL_COMMUNITY): Payer: Self-pay

## 2023-03-26 ENCOUNTER — Emergency Department (HOSPITAL_COMMUNITY): Payer: Medicaid Other

## 2023-03-26 ENCOUNTER — Other Ambulatory Visit: Payer: Self-pay

## 2023-03-26 ENCOUNTER — Inpatient Hospital Stay (HOSPITAL_COMMUNITY)
Admission: EM | Admit: 2023-03-26 | Discharge: 2023-03-29 | DRG: 100 | Disposition: A | Discharge status: Home Health | Payer: Medicaid Other | Attending: Internal Medicine | Admitting: Internal Medicine

## 2023-03-26 DIAGNOSIS — G4710 Hypersomnia, unspecified: Secondary | ICD-10-CM | POA: Diagnosis present

## 2023-03-26 DIAGNOSIS — Z8719 Personal history of other diseases of the digestive system: Secondary | ICD-10-CM

## 2023-03-26 DIAGNOSIS — Z87891 Personal history of nicotine dependence: Secondary | ICD-10-CM

## 2023-03-26 DIAGNOSIS — G9341 Metabolic encephalopathy: Secondary | ICD-10-CM | POA: Diagnosis present

## 2023-03-26 DIAGNOSIS — F319 Bipolar disorder, unspecified: Secondary | ICD-10-CM | POA: Diagnosis present

## 2023-03-26 DIAGNOSIS — R531 Weakness: Secondary | ICD-10-CM

## 2023-03-26 DIAGNOSIS — Z886 Allergy status to analgesic agent status: Secondary | ICD-10-CM

## 2023-03-26 DIAGNOSIS — Z90710 Acquired absence of both cervix and uterus: Secondary | ICD-10-CM

## 2023-03-26 DIAGNOSIS — Z79899 Other long term (current) drug therapy: Secondary | ICD-10-CM

## 2023-03-26 DIAGNOSIS — G43909 Migraine, unspecified, not intractable, without status migrainosus: Secondary | ICD-10-CM | POA: Diagnosis present

## 2023-03-26 DIAGNOSIS — Z5901 Sheltered homelessness: Secondary | ICD-10-CM

## 2023-03-26 DIAGNOSIS — F419 Anxiety disorder, unspecified: Secondary | ICD-10-CM | POA: Diagnosis present

## 2023-03-26 DIAGNOSIS — Z7409 Other reduced mobility: Secondary | ICD-10-CM | POA: Diagnosis present

## 2023-03-26 DIAGNOSIS — R739 Hyperglycemia, unspecified: Secondary | ICD-10-CM | POA: Diagnosis present

## 2023-03-26 DIAGNOSIS — R569 Unspecified convulsions: Principal | ICD-10-CM

## 2023-03-26 DIAGNOSIS — G40909 Epilepsy, unspecified, not intractable, without status epilepticus: Principal | ICD-10-CM | POA: Diagnosis present

## 2023-03-26 DIAGNOSIS — E785 Hyperlipidemia, unspecified: Secondary | ICD-10-CM | POA: Diagnosis present

## 2023-03-26 DIAGNOSIS — Z8673 Personal history of transient ischemic attack (TIA), and cerebral infarction without residual deficits: Secondary | ICD-10-CM

## 2023-03-26 DIAGNOSIS — Z888 Allergy status to other drugs, medicaments and biological substances status: Secondary | ICD-10-CM

## 2023-03-26 LAB — COMPREHENSIVE METABOLIC PANEL
ALT: 17 U/L (ref 0–44)
AST: 18 U/L (ref 15–41)
Albumin: 4 g/dL (ref 3.5–5.0)
Alkaline Phosphatase: 110 U/L (ref 38–126)
Anion gap: 9 (ref 5–15)
BUN: 10 mg/dL (ref 6–20)
CO2: 25 mmol/L (ref 22–32)
Calcium: 9.1 mg/dL (ref 8.9–10.3)
Chloride: 104 mmol/L (ref 98–111)
Creatinine, Ser: 0.97 mg/dL (ref 0.44–1.00)
GFR, Estimated: >60 mL/min (ref >60)
Glucose, Bld: 100 mg/dL — ABNORMAL HIGH (ref 70–99)
Potassium: 3.7 mmol/L (ref 3.5–5.1)
Sodium: 138 mmol/L (ref 135–145)
Total Bilirubin: 0.4 mg/dL (ref 0.3–1.2)
Total Protein: 7.3 g/dL (ref 6.5–8.1)

## 2023-03-26 LAB — CBC WITH DIFFERENTIAL/PLATELET
Abs Immature Granulocytes: 0.02 10*3/uL (ref 0.00–0.07)
Basophils Absolute: 0 10*3/uL (ref 0.0–0.1)
Basophils Relative: 0 %
Eosinophils Absolute: 0 10*3/uL (ref 0.0–0.5)
Eosinophils Relative: 0 %
HCT: 36.2 % (ref 36.0–46.0)
Hemoglobin: 11.9 g/dL — ABNORMAL LOW (ref 12.0–15.0)
Immature Granulocytes: 0 %
Lymphocytes Relative: 26 %
Lymphs Abs: 1.2 10*3/uL (ref 0.7–4.0)
MCH: 28.1 pg (ref 26.0–34.0)
MCHC: 32.9 g/dL (ref 30.0–36.0)
MCV: 85.6 fL (ref 80.0–100.0)
Monocytes Absolute: 0.3 10*3/uL (ref 0.1–1.0)
Monocytes Relative: 7 %
Neutro Abs: 3 10*3/uL (ref 1.7–7.7)
Neutrophils Relative %: 67 %
Platelets: 163 10*3/uL (ref 150–400)
RBC: 4.23 MIL/uL (ref 3.87–5.11)
RDW: 12.1 % (ref 11.5–15.5)
WBC: 4.6 10*3/uL (ref 4.0–10.5)
nRBC: 0 % (ref 0.0–0.2)

## 2023-03-26 LAB — URINALYSIS, W/ REFLEX TO CULTURE (INFECTION SUSPECTED)
Bilirubin Urine: NEGATIVE
Glucose, UA: NEGATIVE mg/dL
Hgb urine dipstick: NEGATIVE
Ketones, ur: NEGATIVE mg/dL
Nitrite: NEGATIVE
Protein, ur: NEGATIVE mg/dL
Specific Gravity, Urine: 1.005 (ref 1.005–1.030)
pH: 6 (ref 5.0–8.0)

## 2023-03-26 LAB — RAPID URINE DRUG SCREEN, HOSP PERFORMED
Amphetamines: NOT DETECTED
Barbiturates: NOT DETECTED
Benzodiazepines: POSITIVE — AB
Cocaine: NOT DETECTED
Opiates: NOT DETECTED
Tetrahydrocannabinol: NOT DETECTED

## 2023-03-26 LAB — ETHANOL: Alcohol, Ethyl (B): <10 mg/dL (ref <10)

## 2023-03-26 MED ORDER — SODIUM CHLORIDE 0.9 % IV BOLUS
1000.0000 mL | Freq: Once | INTRAVENOUS | Status: AC
  Administered 2023-03-26: 1000 mL via INTRAVENOUS

## 2023-03-26 MED ORDER — LORAZEPAM 1 MG PO TABS
1.0000 mg | ORAL_TABLET | Freq: Once | ORAL | Status: AC
  Administered 2023-03-27: 1 mg via ORAL
  Filled 2023-03-26: qty 1

## 2023-03-26 NOTE — ED Provider Notes (Signed)
[place] EMERGENCY DEPARTMENT AT [facility] Provider Note   CSN: [id number] Arrival date & time: 03/26/23  1905     History  Chief Complaint  Patient presents with   Altered Mental Status    Madeline Shaw is a 47 y.o. female.  HPI 47 year old female presents with altered mental status and agitation.  History is from the nurses spoke to EMS as EMS is no longer present.  The patient is also sedated with Versed.  Reportedly female from a hotel called EMS because she was agitated.  The EMS report was that the patient was not making any sense and was altered.  She was given Versed once with not much relief so she was given another 5 mg.  She is currently sedated and hard to get much history from.  I can wake her up, but then she does not really answer my questions and falls back asleep.  Chart review shows that she has a history of a benzodiazepine use disorder, major depressive disorder, and bipolar disorder.  She also carries a diagnosis of previous stroke and seizures.  EMS - combative, agitated, not making sense. Grabbing at paramedics. Ex-BF provided initial history but wasn't helpful.   Home Medications Prior to Admission medications   Medication Sig Start Date End Date Taking? Authorizing Provider  atorvastatin (LIPITOR) 40 MG tablet Take 40 mg by mouth at bedtime. 10/29/22   [provider]  fluvoxaMINE (LUVOX) 50 MG tablet Take 3 tablets by mouth at bedtime. 03/10/23   [provider]  gabapentin (NEURONTIN) 400 MG capsule Take 1 capsule (400 mg total) by mouth 3 (three) times daily. 03/23/23   Fayrene Helper, MD  hydrOXYzine (VISTARIL) 100 MG capsule Take 100 mg by mouth in the morning, at noon, and at bedtime. 03/10/23   [provider]  LamoTRIgine (LAMICTAL XR) 250 MG TB24 24 hour tablet Take 500 mg by mouth at bedtime.    [provider]  mirtazapine (REMERON) 15 MG tablet Take 15 mg by mouth at bedtime.    [provider]  prazosin (MINIPRESS) 2 MG capsule Take 2 mg by mouth at bedtime. 09/04/22   [provider]  VENTOLIN HFA 108 (90 Base) MCG/ACT inhaler Inhale 1 puff into the lungs every 6 (six) hours as needed for wheezing or shortness of breath. 01/25/22   [provider]  verapamil (CALAN-SR) 120 MG CR tablet Take 120 mg by mouth daily. 11/18/22   [provider]      Allergies    Sumatriptan, Acetaminophen, Chlorpheniramine, Chlorpheniramine-dm, Topiramate, Chlorpheniramine maleate, Dextromethorphan hbr, and Diphenhydramine hcl    Review of Systems   Review of Systems  Unable to perform ROS: Mental status change    Physical Exam Updated Vital Signs BP 118/71   Pulse 79   Temp 97.7 F (36.5 C) (Oral)   Resp (!) 21   Ht 5\' 1"  (1.549 m)   Wt 46 kg   SpO2 98%   BMI 19.16 kg/m  Physical Exam Vitals and nursing note reviewed.  Constitutional:      Appearance: She is well-developed.  HENT:     Head: Normocephalic.   Eyes:     Pupils: Pupils are equal, round, and reactive to light.  Cardiovascular:     Rate and Rhythm: Normal rate and regular rhythm.     Pulses:          Radial pulses are 2+ on the left side.     Heart sounds:  Normal heart sounds.  Pulmonary:     Effort: Pulmonary effort is normal.     Breath sounds: Normal breath sounds.  Abdominal:     Palpations: Abdomen is soft.     Tenderness: There is no abdominal tenderness.  Musculoskeletal:     Left shoulder: No tenderness. Normal range of motion.       Arms:     Cervical back: No spinous process tenderness or muscular tenderness.  Skin:    General: Skin is warm and dry.  Neurological:     Comments: Patient is sleeping.  She is spontaneously breathing.  When I gently shake her, and call her name she will briefly open her eyes but then quickly fall back asleep.  Does move her extremities though weakly and appearing legs symmetrically.     ED Results / Procedures / Treatments   Labs (all labs  ordered are listed, but only abnormal results are displayed) Labs Reviewed  URINALYSIS, W/ REFLEX TO CULTURE (INFECTION SUSPECTED) - Abnormal; Notable for the following components:      Result Value   Color, Urine STRAW (*)    Leukocytes,Ua LARGE (*)    Bacteria, UA RARE (*)    All other components within normal limits  RAPID URINE DRUG SCREEN, HOSP PERFORMED - Abnormal; Notable for the following components:   Benzodiazepines POSITIVE (*)    All other components within normal limits  CBC WITH DIFFERENTIAL/PLATELET - Abnormal; Notable for the following components:   Hemoglobin 11.9 (*)    All other components within normal limits  COMPREHENSIVE METABOLIC PANEL - Abnormal; Notable for the following components:   Glucose, Bld 100 (*)    All other components within normal limits  ETHANOL    EKG EKG Interpretation Date/Time:  Wednesday March 26 2023 20:44:38 EDT Ventricular Rate:  82 PR Interval:  157 QRS Duration:  98 QT Interval:  511 QTC Calculation: 597 R Axis:   62  Text Interpretation: Sinus rhythm Prolonged QT interval nonspecific ST changes overall similar to March 23 2023 Confirmed by Pricilla Loveless [phone]) on 03/26/2023 9:07:41 PM  Radiology CT Head Wo Contrast  Result Date: 03/26/2023 CLINICAL DATA:  Trauma. EXAM: CT HEAD WITHOUT CONTRAST CT MAXILLOFACIAL WITHOUT CONTRAST CT CERVICAL SPINE WITHOUT CONTRAST TECHNIQUE: Multidetector CT imaging of the head, cervical spine, and maxillofacial structures were performed using the standard protocol without intravenous contrast. Multiplanar CT image reconstructions of the cervical spine and maxillofacial structures were also generated. RADIATION DOSE REDUCTION: This exam was performed according to the departmental dose-optimization program which includes automated exposure control, adjustment of the mA and/or kV according to patient size and/or use of iterative reconstruction technique. COMPARISON:  Head CT dated 03/23/2023. FINDINGS:  CT HEAD FINDINGS Brain: The ventricles and sulci are appropriate size for the patient's age. The gray-white matter discrimination is preserved. There is no acute intracranial hemorrhage. No mass effect or midline shift. No extra-axial fluid collection. Vascular: No hyperdense vessel or unexpected calcification. Skull: Normal. Negative for fracture or focal lesion. Other: None CT MAXILLOFACIAL FINDINGS Osseous: No acute fracture. No mandibular subluxation. Multiple dental caries and periapical lucencies. Orbits: Negative. No traumatic or inflammatory finding. Sinuses: Mild mucoperiosteal thickening of paranasal sinuses. No air-fluid level. Mastoid air cells are clear. Soft tissues: Mild contusion of the right face.  No hematoma. CT CERVICAL SPINE FINDINGS Alignment: No acute subluxation. Skull base and vertebrae: No acute fracture. Soft tissues and spinal canal: No prevertebral fluid or swelling. No visible canal hematoma. Disc levels:  No acute  findings. Upper chest: Negative. Other: None IMPRESSION: 1. No acute intracranial pathology. 2. No acute/traumatic cervical spine pathology. 3. No acute facial bone fractures. Mild contusion of the right face. Electronically Signed   By: Elgie Collard M.D.   On: 03/26/2023 20:23   CT Cervical Spine Wo Contrast  Result Date: 03/26/2023 CLINICAL DATA:  Trauma. EXAM: CT HEAD WITHOUT CONTRAST CT MAXILLOFACIAL WITHOUT CONTRAST CT CERVICAL SPINE WITHOUT CONTRAST TECHNIQUE: Multidetector CT imaging of the head, cervical spine, and maxillofacial structures were performed using the standard protocol without intravenous contrast. Multiplanar CT image reconstructions of the cervical spine and maxillofacial structures were also generated. RADIATION DOSE REDUCTION: This exam was performed according to the departmental dose-optimization program which includes automated exposure control, adjustment of the mA and/or kV according to patient size and/or use of iterative reconstruction  technique. COMPARISON:  Head CT dated 03/23/2023. FINDINGS: CT HEAD FINDINGS Brain: The ventricles and sulci are appropriate size for the patient's age. The gray-white matter discrimination is preserved. There is no acute intracranial hemorrhage. No mass effect or midline shift. No extra-axial fluid collection. Vascular: No hyperdense vessel or unexpected calcification. Skull: Normal. Negative for fracture or focal lesion. Other: None CT MAXILLOFACIAL FINDINGS Osseous: No acute fracture. No mandibular subluxation. Multiple dental caries and periapical lucencies. Orbits: Negative. No traumatic or inflammatory finding. Sinuses: Mild mucoperiosteal thickening of paranasal sinuses. No air-fluid level. Mastoid air cells are clear. Soft tissues: Mild contusion of the right face.  No hematoma. CT CERVICAL SPINE FINDINGS Alignment: No acute subluxation. Skull base and vertebrae: No acute fracture. Soft tissues and spinal canal: No prevertebral fluid or swelling. No visible canal hematoma. Disc levels:  No acute findings. Upper chest: Negative. Other: None IMPRESSION: 1. No acute intracranial pathology. 2. No acute/traumatic cervical spine pathology. 3. No acute facial bone fractures. Mild contusion of the right face. Electronically Signed   By: Elgie Collard M.D.   On: 03/26/2023 20:23   CT Maxillofacial Wo Contrast  Result Date: 03/26/2023 CLINICAL DATA:  Trauma. EXAM: CT HEAD WITHOUT CONTRAST CT MAXILLOFACIAL WITHOUT CONTRAST CT CERVICAL SPINE WITHOUT CONTRAST TECHNIQUE: Multidetector CT imaging of the head, cervical spine, and maxillofacial structures were performed using the standard protocol without intravenous contrast. Multiplanar CT image reconstructions of the cervical spine and maxillofacial structures were also generated. RADIATION DOSE REDUCTION: This exam was performed according to the departmental dose-optimization program which includes automated exposure control, adjustment of the mA and/or kV  according to patient size and/or use of iterative reconstruction technique. COMPARISON:  Head CT dated 03/23/2023. FINDINGS: CT HEAD FINDINGS Brain: The ventricles and sulci are appropriate size for the patient's age. The gray-white matter discrimination is preserved. There is no acute intracranial hemorrhage. No mass effect or midline shift. No extra-axial fluid collection. Vascular: No hyperdense vessel or unexpected calcification. Skull: Normal. Negative for fracture or focal lesion. Other: None CT MAXILLOFACIAL FINDINGS Osseous: No acute fracture. No mandibular subluxation. Multiple dental caries and periapical lucencies. Orbits: Negative. No traumatic or inflammatory finding. Sinuses: Mild mucoperiosteal thickening of paranasal sinuses. No air-fluid level. Mastoid air cells are clear. Soft tissues: Mild contusion of the right face.  No hematoma. CT CERVICAL SPINE FINDINGS Alignment: No acute subluxation. Skull base and vertebrae: No acute fracture. Soft tissues and spinal canal: No prevertebral fluid or swelling. No visible canal hematoma. Disc levels:  No acute findings. Upper chest: Negative. Other: None IMPRESSION: 1. No acute intracranial pathology. 2. No acute/traumatic cervical spine pathology. 3. No acute facial bone fractures. Mild contusion of  the right face. Electronically Signed   By: Elgie Collard M.D.   On: 03/26/2023 20:23   DG Shoulder Left  Result Date: 03/26/2023 CLINICAL DATA:  Altered mental status, trauma EXAM: LEFT SHOULDER - 2+ VIEW COMPARISON:  None Available. FINDINGS: No displaced fracture or dislocation is seen. IMPRESSION: No fracture or dislocation is seen in left shoulder. Electronically Signed   By: Ernie Avena M.D.   On: 03/26/2023 19:59   DG Chest 1 View  Result Date: 03/26/2023 CLINICAL DATA:  Altered mental status EXAM: CHEST  1 VIEW COMPARISON:  03/23/2023 FINDINGS: Cardiac size is within normal limits. There are no signs of pulmonary edema. Increased  markings are seen in medial lower lung fields. Rest of the lung fields are clear. There is poor inspiration. There is no pleural effusion or pneumothorax. IMPRESSION: Increased markings in the medial lower lung fields may suggest crowding of bronchovascular structures due to poor inspiration or suggest atelectasis/pneumonia. Electronically Signed   By: Ernie Avena M.D.   On: 03/26/2023 19:58    Procedures Procedures    Medications Ordered in ED Medications  sodium chloride 0.9 % bolus 1,000 mL (0 mLs Intravenous Stopped 03/26/23 2130)    ED Course/ Medical Decision Making/ A&P                             Medical Decision Making Amount and/or Complexity of Data Reviewed External Data Reviewed: notes. Labs: ordered.    Details: Normal EtOH.  Normal WBC.  No significant electrolyte disturbance. Radiology: ordered and independent interpretation performed.    Details: No head bleed ECG/medicine tests: ordered and independent interpretation performed.    Details: No acute ischemia  Risk Prescription drug management.   Boyfriend arrived later.  He was able to state that the patient had a seizure.  This likely explains that she was having a postictal state.  She is now back to her baseline.  However she has been having on and off trouble walking for a long time, worse over the last few days.  Seems like it could be vertigo as she has had this for a long time in the past.  She states Ativan or Klonopin are the only things that seem to help.  We attempted to walk her but she needed significant assistance and did not do well.  It seems like the bruising on her face came from a fall according to her.  Given she is unsafe to walk at this point I think she will need admission, monitoring, and likely MRI in the morning (not available at this time at night).  Discussed with hospitalist, Dr. Margo Aye, for admission.        Final Clinical Impression(s) / ED Diagnoses Final diagnoses:  Seizure  [facility])    Rx / DC Orders ED Discharge Orders     None         Pricilla Loveless, MD 03/26/23 2356

## 2023-03-26 NOTE — Discharge Instructions (Signed)
-   According to [place] law, you can not drive unless you are seizure / syncope free for at least 6 months and under physician's care.  °  °- Please maintain precautions. Do not participate in activities where a loss of awareness could harm you or someone else. No swimming alone, no tub bathing, no hot tubs, no driving, no operating motorized vehicles (cars, ATVs, motocycles, etc), lawnmowers, power tools or firearms. No standing at heights, such as rooftops, ladders or stairs. Avoid hot objects such as stoves, heaters, open fires. Wear a helmet when riding a bicycle, scooter, skateboard, etc. and avoid areas of traffic. Set your water heater to 120 degrees or less.  °

## 2023-03-26 NOTE — ED Notes (Signed)
Patient's boyfriend states that he witnessed patient having a seizure at the hotel, described it as "twitching"

## 2023-03-26 NOTE — ED Notes (Signed)
Ambulated pt to bathroom. Pt is wobbly and unsteady while walking and required two people to walk

## 2023-03-26 NOTE — ED Triage Notes (Signed)
Patient arrived via [facility] from a hotel room where she was found to be combative, agitated, kicking.  Someone who reported being her ex-boyfriend was at the scene but provided no information per EMS.  Patient was given 5mg  and then another 5mg  of versed en route.  On arrival, patient is snoring and difficult to arouse.

## 2023-03-27 ENCOUNTER — Other Ambulatory Visit: Payer: Self-pay

## 2023-03-27 DIAGNOSIS — R531 Weakness: Secondary | ICD-10-CM | POA: Diagnosis not present

## 2023-03-27 LAB — BASIC METABOLIC PANEL
Anion gap: 10 (ref 5–15)
BUN: 8 mg/dL (ref 6–20)
CO2: 24 mmol/L (ref 22–32)
Calcium: 8.6 mg/dL — ABNORMAL LOW (ref 8.9–10.3)
Chloride: 104 mmol/L (ref 98–111)
Creatinine, Ser: 0.84 mg/dL (ref 0.44–1.00)
GFR, Estimated: >60 mL/min (ref >60)
Glucose, Bld: 76 mg/dL (ref 70–99)
Potassium: 3.5 mmol/L (ref 3.5–5.1)
Sodium: 138 mmol/L (ref 135–145)

## 2023-03-27 LAB — PHOSPHORUS: Phosphorus: 3.9 mg/dL (ref 2.5–4.6)

## 2023-03-27 LAB — CBC
HCT: 35 % — ABNORMAL LOW (ref 36.0–46.0)
Hemoglobin: 11.4 g/dL — ABNORMAL LOW (ref 12.0–15.0)
MCH: 28.1 pg (ref 26.0–34.0)
MCHC: 32.6 g/dL (ref 30.0–36.0)
MCV: 86.2 fL (ref 80.0–100.0)
Platelets: 157 10*3/uL (ref 150–400)
RBC: 4.06 MIL/uL (ref 3.87–5.11)
RDW: 12 % (ref 11.5–15.5)
WBC: 4.8 10*3/uL (ref 4.0–10.5)
nRBC: 0 % (ref 0.0–0.2)

## 2023-03-27 LAB — GLUCOSE, CAPILLARY: Glucose-Capillary: 110 mg/dL — ABNORMAL HIGH (ref 70–99)

## 2023-03-27 LAB — MAGNESIUM: Magnesium: 2.1 mg/dL (ref 1.7–2.4)

## 2023-03-27 MED ORDER — OXYCODONE HCL 5 MG PO TABS
5.0000 mg | ORAL_TABLET | Freq: Four times a day (QID) | ORAL | Status: AC | PRN
  Administered 2023-03-27 – 2023-03-28 (×4): 5 mg via ORAL
  Filled 2023-03-27 (×4): qty 1

## 2023-03-27 MED ORDER — MELATONIN 5 MG PO TABS
5.0000 mg | ORAL_TABLET | Freq: Every evening | ORAL | Status: DC | PRN
  Administered 2023-03-27: 5 mg via ORAL
  Filled 2023-03-27: qty 1

## 2023-03-27 MED ORDER — LACTATED RINGERS IV SOLN: INTRAVENOUS | Status: AC

## 2023-03-27 MED ORDER — HALOPERIDOL LACTATE 5 MG/ML IJ SOLN
2.0000 mg | Freq: Four times a day (QID) | INTRAMUSCULAR | Status: DC | PRN
  Administered 2023-03-27: 2 mg via INTRAVENOUS
  Filled 2023-03-27: qty 1

## 2023-03-27 MED ORDER — LAMOTRIGINE ER 100 MG PO TB24
500.0000 mg | ORAL_TABLET | Freq: Every day | ORAL | Status: DC
  Filled 2023-03-27: qty 5

## 2023-03-27 MED ORDER — ACETAMINOPHEN 325 MG PO TABS
650.0000 mg | ORAL_TABLET | Freq: Four times a day (QID) | ORAL | Status: DC | PRN
  Administered 2023-03-27 – 2023-03-29 (×6): 650 mg via ORAL
  Filled 2023-03-27 (×6): qty 2

## 2023-03-27 MED ORDER — ATORVASTATIN CALCIUM 40 MG PO TABS
40.0000 mg | ORAL_TABLET | Freq: Every day | ORAL | Status: DC
  Administered 2023-03-27 – 2023-03-28 (×2): 40 mg via ORAL
  Filled 2023-03-27 (×2): qty 1

## 2023-03-27 MED ORDER — ENOXAPARIN SODIUM 40 MG/0.4ML IJ SOSY
40.0000 mg | PREFILLED_SYRINGE | INTRAMUSCULAR | Status: DC
  Administered 2023-03-27 – 2023-03-29 (×3): 40 mg via SUBCUTANEOUS
  Filled 2023-03-27 (×3): qty 0.4

## 2023-03-27 MED ORDER — LORAZEPAM 2 MG/ML IJ SOLN
2.0000 mg | Freq: Four times a day (QID) | INTRAMUSCULAR | Status: DC | PRN
  Administered 2023-03-27: 2 mg via INTRAVENOUS
  Filled 2023-03-27 (×2): qty 1

## 2023-03-27 MED ORDER — LAMOTRIGINE ER 100 MG PO TB24
500.0000 mg | ORAL_TABLET | Freq: Every day | ORAL | Status: DC
  Administered 2023-03-27 – 2023-03-29 (×3): 500 mg via ORAL
  Filled 2023-03-27 (×3): qty 5

## 2023-03-27 MED ORDER — POLYETHYLENE GLYCOL 3350 17 G PO PACK: 17.0000 g | PACK | Freq: Every day | ORAL | Status: DC | PRN

## 2023-03-27 MED ORDER — HYDROXYZINE HCL 50 MG PO TABS
100.0000 mg | ORAL_TABLET | Freq: Three times a day (TID) | ORAL | Status: DC | PRN
  Administered 2023-03-27 – 2023-03-29 (×4): 100 mg via ORAL
  Filled 2023-03-27 (×4): qty 2

## 2023-03-27 MED ORDER — GABAPENTIN 400 MG PO CAPS
400.0000 mg | ORAL_CAPSULE | Freq: Three times a day (TID) | ORAL | Status: DC
  Administered 2023-03-27 – 2023-03-29 (×6): 400 mg via ORAL
  Filled 2023-03-27 (×7): qty 1

## 2023-03-27 NOTE — Progress Notes (Signed)
Staff from the safety observation called to notify RN that pt has some medication bottles with her, and she is getting up, not following direction. RN went in, and found 3 bottles of medication bottles with her, 2 of the bottles has different meds in it(capsules, a big pink tabs, and medium size white tabs). I bottle was labelled Lamictal, second one was Hydroxyzine, and the 3rd bottle has gummie in it. Pt was upset cos I wanted to take the meds from her and store them. She decided to count her meds, She was counting the medications several time, dropping some on the floor, and on her bed, while counting, she immediately threw one of the capsule in her mouth, and swallow. She is been confused, uncooperative, and getting out of bed. The charge nurse, and attending Dr. Pola Corn notified, ordered to give ativan, and later haldol. Sitter at the bedside, staff will continue to monitor the pt.

## 2023-03-27 NOTE — Plan of Care (Signed)
  Problem: Education: Goal: Knowledge of General Education information will improve Description: Including pain rating scale, medication(s)/side effects and non-pharmacologic comfort measures Outcome: Progressing   Problem: Health Behavior/Discharge Planning: Goal: Ability to manage health-related needs will improve Outcome: Progressing   Problem: Clinical Measurements: Goal: Will remain free from infection Outcome: Progressing   

## 2023-03-27 NOTE — Evaluation (Signed)
Occupational Therapy Evaluation Patient Details Name: Madeline Shaw MRN: [id number] DOB: [birth year] Today's Date: 03/27/2023   History of Present Illness Madeline Shaw is a 47 y.o. female who presented after a seizure-like activity at the hotel room where she lives, admitted with generalized weakness. PMH seizure disorder, hyperlipidemia, chronic anxiety/depression   Clinical Impression   The pt is currently limited by the below listed deficits (see OT problem list). She requires min guard assist for tasks such as sit to stand and lower body dressing. She was noted to be with unsteadiness in standing, as well as intermittent impulsivity and decreased safety awareness. She reports her current unsteadiness to be worse than typical for her. She required consistent cues to not perform tasks in a hurried manner. OT will follow her for further services in the acute care setting to maximize her safety and independence with self-care tasks. OT anticipates she will be okay to return home at discharge, though intermittent supervision/assist may be needed.      Recommendations for follow up therapy are one component of a multi-disciplinary discharge planning process, led by the attending physician.  Recommendations may be updated based on patient status, additional functional criteria and insurance authorization.   Assistance Recommended at Discharge Intermittent Supervision/Assistance  Patient can return home with the following A little help with walking and/or transfers;A little help with bathing/dressing/bathroom;Assist for transportation    Functional Status Assessment  Patient has had a recent decline in their functional status and demonstrates the ability to make significant improvements in function in a reasonable and predictable amount of time.  Equipment Recommendations  Tub/shower seat    Recommendations for Other Services       Precautions / Restrictions Precautions Precautions:  Fall Restrictions Weight Bearing Restrictions: No      Mobility Bed Mobility Overal bed mobility: Needs Assistance Bed Mobility: Supine to Sit, Sit to Supine     Supine to sit: Supervision Sit to supine: Supervision        Transfers Overall transfer level: Needs assistance Equipment used: None Transfers: Sit to/from Stand Sit to Stand: Min guard           General transfer comment:  (Pt noted to perform in a hurried manner, requiring cues for safety awareness)      ADL either performed or assessed with clinical judgement   ADL Overall ADL's : Independent Eating/Feeding: Independent Eating/Feeding Details (indicate cue type and reason): Pt observed feeding independently with legs crossed and seated upright in bed. Grooming: Min guard;Standing           Upper Body Dressing : Set up;Sitting   Lower Body Dressing: Min guard;Sit to/from stand   Toilet Transfer: Minimal assistance;Ambulation;Grab bars Toilet Transfer Details (indicate cue type and reason): Pt ambulated to the bathroom without an assistive device. She presented with unsteadiness and impulsivity, requiring assist for balance and cues for safety awareness. Toileting- Architect and Hygiene: Min guard;Sit to/from stand               Vision   Additional Comments: She reported a history of CVA affecting her vision. She reported blurried vision and occasional "floaters."            Pertinent Vitals/Pain Pain Assessment Pain Assessment: 0-10 Pain Score: 7  Pain Location: face and R thigh Pain Intervention(s): Limited activity within patient's tolerance, Monitored during session, Other (comment) (She reported receiving pain medication.)     Hand Dominance     Extremity/Trunk Assessment Upper Extremity Assessment  Upper Extremity Assessment: Overall WFL for tasks assessed;LUE deficits/detail LUE Deficits / Details: Grip strength slighter weaker than R grip strength. L grip strength  4/5. Pt reports this is from prior CVA    Lower Extremity Assessment Lower Extremity Assessment: Overall WFL for tasks assessed   Cervical / Trunk Assessment Cervical / Trunk Assessment: Normal   Communication     Cognition Arousal/Alertness: Lethargic, Awake/alert Behavior During Therapy: Impulsive Overall Cognitive Status: No family/caregiver present to determine baseline cognitive functioning        General Comments: Oriented to person, place, and situation. She reported the date to be "September 27, 2023."  She required cues for safety awareness, impulsivity noted, she presented with intermittent episodes of lethargy with pt requiring cues to open her eyes and to complete her thoughts/sentence.                Home Living Family/patient expects to be discharged to:: Unsure Living Arrangements: Alone   Type of Home: Other(Comment) (She reports living in a hotel for the past ~1 month.)          Home Equipment: None       Prior Functioning/Environment Prior Level of Function : Independent/Modified Independent             Mobility Comments:  (She reported being independent with ambulation.) ADLs Comments:  (She reported being independent with ADLs. She does not currently drive.)        OT Problem List: Impaired balance (sitting and/or standing);Decreased cognition;Decreased safety awareness;Decreased knowledge of use of DME or AE      OT Treatment/Interventions: Self-care/ADL training;Therapeutic exercise;DME and/or AE instruction;Therapeutic activities;Cognitive remediation/compensation;Patient/family education;Balance training;Visual/perceptual remediation/compensation    OT Goals(Current goals can be found in the care plan section) Acute Rehab OT Goals OT Goal Formulation: With patient Time For Goal Achievement: 04/10/23 Potential to Achieve Goals: Good ADL Goals Pt Will Perform Grooming: with modified independence;standing Pt Will Perform Lower Body  Dressing: with modified independence;sit to/from stand Pt Will Transfer to Toilet: with modified independence;ambulating Pt Will Perform Toileting - Clothing Manipulation and hygiene: with modified independence;sit to/from stand  OT Frequency: Min 1X/week       AM-PAC OT "6 Clicks" Daily Activity     Outcome Measure Help from another person eating meals?: None Help from another person taking care of personal grooming?: A Little Help from another person toileting, which includes using toliet, bedpan, or urinal?: A Little Help from another person bathing (including washing, rinsing, drying)?: A Little Help from another person to put on and taking off regular upper body clothing?: A Little Help from another person to put on and taking off regular lower body clothing?: A Little 6 Click Score: 19   End of Session Equipment Utilized During Treatment: Other (comment) Nurse Communication: Mobility status  Activity Tolerance: Patient tolerated treatment well Patient left: in bed;with call bell/phone within reach;with bed alarm set  OT Visit Diagnosis: Unsteadiness on feet (R26.81);History of falling (Z91.81)                Time: 8413-2440 OT Time Calculation (min): 22 min Charges:  OT General Charges $OT Visit: 1 Visit OT Evaluation $OT Eval Moderate Complexity: 1 Mod    Darrill Vreeland L Frazier Balfour, OTR/L 03/27/2023, 1:32 PM

## 2023-03-27 NOTE — Significant Event (Signed)
Seen and examined this afternoon again. Patient has been very agitated and restless.  Per RN, patient self-administered some pills earlier.  Patient adamantly denies doing any drugs or alcohol. I called the emergency contact listed -no response She is on IV Ativan as needed.  I also added IV Haldol as needed.  QTc in EKG yesterday was 498 ms. Will monitor serial EKGs if she allows.  Earlier she did not allow EEG.

## 2023-03-27 NOTE — TOC Initial Note (Signed)
Transition of Care [facility]) - Initial/Assessment Note    Patient Details  Name: Madeline Shaw MRN: [id number] Date of Birth: 07-15-1976  Transition of Care [facility]) CM/SW Contact:    Larrie Kass, LCSW Phone Number: 03/27/2023, 3:54 PM  Clinical Narrative:                 CSW met with pt at the bedside, pt disoriented, and unable to discuss HH rec or conduct TOC assessment. CSW attempted to call pt's emergency contact Abran Cantor, and left a HIPAA complaint VM requesting a return call. CSW will follow up with pt at a later time. TOC to follow.   Expected Discharge Plan: Home w Home Health Services Barriers to Discharge: Continued Medical Work up   Patient Goals and CMS Choice            Expected Discharge Plan and Services                                              Prior Living Arrangements/Services                       Activities of Daily Living Home Assistive Devices/Equipment: None ADL Screening (condition at time of admission) Patient's cognitive ability adequate to safely complete daily activities?: Yes Is the patient deaf or have difficulty hearing?: No Does the patient have difficulty seeing, even when wearing glasses/contacts?: No Does the patient have difficulty concentrating, remembering, or making decisions?: No Patient able to express need for assistance with ADLs?: Yes Does the patient have difficulty dressing or bathing?: No Independently performs ADLs?: No Communication: Independent Dressing (OT): Independent Grooming: Independent Feeding: Independent Bathing: Needs assistance Is this a change from baseline?: Change from baseline, expected to last <3 days Toileting: Needs assistance Is this a change from baseline?: Change from baseline, expected to last <3 days In/Out Bed: Needs assistance Is this a change from baseline?: Change from baseline, expected to last <3 days Walks in Home: Independent Does the patient have  difficulty walking or climbing stairs?: No Weakness of Legs: Both Weakness of Arms/Hands: None  Permission Sought/Granted                  Emotional Assessment   Attitude/Demeanor/Rapport: Unable to Assess Affect (typically observed): Unable to Assess Orientation: : Oriented to Self      Admission diagnosis:  Seizure (HCC) [R56.9] Generalized weakness [R53.1] Patient Active Problem List   Diagnosis Date Noted   Generalized weakness 03/26/2023   Bipolar I disorder, most recent episode depressed, severe w psychosis (HCC) 07/18/2018   Severe benzodiazepine use disorder (HCC) 04/29/2018   MDD (major depressive disorder), severe (HCC) 04/29/2018   Suicidal ideation    PCP:  [facility]ness, [facility]:   [id number] Rosalita Levan, [place] - [address] AT [facility] [address] [place] [address] Phone: [phone] Fax: [phone]     Social Determinants of Health (SDOH) Social History: SDOH Screenings   Food Insecurity: No Food Insecurity (03/27/2023)  Housing: Low Risk  (03/27/2023)  Transportation Needs: No Transportation Needs (03/27/2023)  Utilities: Not At Risk (03/27/2023)  Alcohol Screen: Low Risk  (04/30/2018)  Financial Resource Strain: Low Risk  (12/05/2022)   Received from [facility]  Physical Activity: Unknown (04/30/2018)  Social Connections:  Unknown (10/27/2022)   Received from [facility]  Stress: Stress Concern Present (12/26/2022)   Received from [facility]  Tobacco Use: Medium Risk (03/26/2023)   SDOH Interventions:     Readmission Risk Interventions     No data to display

## 2023-03-27 NOTE — ED Notes (Signed)
Pt gait is severely unsteady, needed assistance's ambulating from NT as well as brother.

## 2023-03-27 NOTE — ED Notes (Signed)
ED TO INPATIENT HANDOFF REPORT  ED Nurse Name and Phone #: Linus Orn Name/Age/Gender Madeline Shaw 47 y.o. female Room/Bed: WA04/WA04  Code Status   Code Status: Prior  Home/SNF/Other Home Patient oriented to: self, place, time, and situation Is this baseline? Yes   Triage Complete: Triage complete  Chief Complaint Generalized weakness [R53.1]  Triage Note Patient arrived via [facility] from a hotel room where she was found to be combative, agitated, kicking.  Someone who reported being her ex-boyfriend was at the scene but provided no information per EMS.  Patient was given 5mg  and then another 5mg  of versed en route.  On arrival, patient is snoring and difficult to arouse.   Allergies Allergies  Allergen Reactions   Sumatriptan Anaphylaxis, Rash and Swelling   Acetaminophen Other (See Comments)    Bladder spasms/pressure on bladder  Other Reaction(s): Bladder Spasms (intolerance), Bladder Spasms (intolerance), Other (See Comments)  Other reaction(s): Bladder Spasms (intolerance)  Reaction: Bladder Spasms (intolerance); Other reaction(s): Bladder Spasms (intolerance)   Reaction: Bladder Spasms (intolerance)    Pt denies allergic to tylenol   Chlorpheniramine     Other Reaction(s): Bladder Spasms (intolerance), Other (See Comments)  Bladder spasms  Reaction: Bladder Spasms (intolerance); Bladder spasms   Chlorpheniramine-Dm Other (See Comments)    Bladder spasms - reported by [facility] in 2014  Reaction to Triaminic  Other Reaction(s): Other  Reaction: Bladder Spasms (intolerance); Bladder spasms     Bladder spasms   Topiramate     Other Reaction(s): Other  Unable to take - due to already taking Lamictal /beta-blockers contraindicated due to history of asthma.   Chlorpheniramine Maleate     Other Reaction(s): Bladder Spasms (intolerance)   Dextromethorphan Hbr     Other Reaction(s): Bladder Spasms (intolerance)   Diphenhydramine Hcl      Level of Care/Admitting Diagnosis ED Disposition     ED Disposition  Admit   Condition  --   Comment  Hospital Area: [facility] [[id number]]  Level of Care: Telemetry [5]  Admit to tele based on following criteria: Other see comments  Comments: high risk  May place patient in observation at [facility] or [facility] if equivalent level of care is available:: Yes  Covid Evaluation: Asymptomatic - no recent exposure (last 10 days) testing not required  Diagnosis: Generalized weakness [086578]  Admitting Physician: Darlin Drop [[id number]]  Attending Physician: Darlin Drop [[id number]]          B Medical/Surgery History Past Medical History:  Diagnosis Date   Anxiety    Cancer (HCC)    Depression    Gall stones    Seizures (HCC)    Stroke (HCC)    Vertigo    Past Surgical History:  Procedure Laterality Date   ABDOMINAL HYSTERECTOMY       A IV Location/Drains/Wounds Patient Lines/Drains/Airways Status     Active Line/Drains/Airways     Name Placement date Placement time Site Days   Peripheral IV 03/26/23 20 G Distal;Left Forearm 03/26/23  --  Forearm  1            Intake/Output Last 24 hours  Intake/Output Summary (Last 24 hours) at 03/27/2023 0029 Last data filed at 03/26/2023 2130 Gross per 24 hour  Intake 1000 ml  Output --  Net 1000 ml    Labs/Imaging Results for orders placed or performed during the hospital encounter of 03/26/23 (from the past 48 hour(s))  CBC with Differential  Status: Abnormal   Collection Time: 03/26/23  8:27 PM  Result Value Ref Range   WBC 4.6 4.0 - 10.5 K/uL   RBC 4.23 3.87 - 5.11 MIL/uL   Hemoglobin 11.9 (L) 12.0 - 15.0 g/dL   HCT 95.2 84.1 - 32.4 %   MCV 85.6 80.0 - 100.0 fL   MCH 28.1 26.0 - 34.0 pg   MCHC 32.9 30.0 - 36.0 g/dL   RDW 40.1 02.7 - 25.3 %   Platelets 163 150 - 400 K/uL   nRBC 0.0 0.0 - 0.2 %   Neutrophils Relative % 67 %   Neutro Abs 3.0 1.7 - 7.7 K/uL   Lymphocytes  Relative 26 %   Lymphs Abs 1.2 0.7 - 4.0 K/uL   Monocytes Relative 7 %   Monocytes Absolute 0.3 0.1 - 1.0 K/uL   Eosinophils Relative 0 %   Eosinophils Absolute 0.0 0.0 - 0.5 K/uL   Basophils Relative 0 %   Basophils Absolute 0.0 0.0 - 0.1 K/uL   Immature Granulocytes 0 %   Abs Immature Granulocytes 0.02 0.00 - 0.07 K/uL    Comment: Performed at [facility], [address], [place], [place] [address]  Comprehensive metabolic panel     Status: Abnormal   Collection Time: 03/26/23  8:27 PM  Result Value Ref Range   Sodium 138 135 - 145 mmol/L   Potassium 3.7 3.5 - 5.1 mmol/L   Chloride 104 98 - 111 mmol/L   CO2 25 22 - 32 mmol/L   Glucose, Bld 100 (H) 70 - 99 mg/dL    Comment: Glucose reference range applies only to samples taken after fasting for at least 8 hours.   BUN 10 6 - 20 mg/dL   Creatinine, Ser 3.47 0.44 - 1.00 mg/dL   Calcium 9.1 8.9 - 42.5 mg/dL   Total Protein 7.3 6.5 - 8.1 g/dL   Albumin 4.0 3.5 - 5.0 g/dL   AST 18 15 - 41 U/L   ALT 17 0 - 44 U/L   Alkaline Phosphatase 110 38 - 126 U/L   Total Bilirubin 0.4 0.3 - 1.2 mg/dL   GFR, Estimated >95 >63 mL/min    Comment: (NOTE) Calculated using the CKD-EPI Creatinine Equation (2021)    Anion gap 9 5 - 15    Comment: Performed at [facility], [address] [place], [place] [address]  Ethanol     Status: None   Collection Time: 03/26/23  8:53 PM  Result Value Ref Range   Alcohol, Ethyl (B) <10 <10 mg/dL    Comment: (NOTE) Lowest detectable limit for serum alcohol is 10 mg/dL.  For medical purposes only. Performed at [facility], [address], [place], [place] [address]   Urinalysis, w/ Reflex to Culture (Infection Suspected) -Urine, Clean Catch     Status: Abnormal   Collection Time: 03/26/23  9:55 PM  Result Value Ref Range   Specimen Source URINE, CLEAN CATCH    Color, Urine STRAW (A) YELLOW   APPearance CLEAR CLEAR   Specific Gravity, Urine  1.005 1.005 - 1.030   pH 6.0 5.0 - 8.0   Glucose, UA NEGATIVE NEGATIVE mg/dL   Hgb urine dipstick NEGATIVE NEGATIVE   Bilirubin Urine NEGATIVE NEGATIVE   Ketones, ur NEGATIVE NEGATIVE mg/dL   Protein, ur NEGATIVE NEGATIVE mg/dL   Nitrite NEGATIVE NEGATIVE   Leukocytes,Ua LARGE (A) NEGATIVE   RBC / HPF 0-5 0 - 5 RBC/hpf   WBC, UA 6-10  0 - 5 WBC/hpf    Comment:        Reflex urine culture not performed if WBC <=10, OR if Squamous epithelial cells >5. If Squamous epithelial cells >5 suggest recollection.    Bacteria, UA RARE (A) NONE SEEN   Squamous Epithelial / HPF 0-5 0 - 5 /HPF   Mucus PRESENT     Comment: Performed at [facility], [address], [place], [place] [address]  Urine rapid drug screen (hosp performed)     Status: Abnormal   Collection Time: 03/26/23  9:55 PM  Result Value Ref Range   Opiates NONE DETECTED NONE DETECTED   Cocaine NONE DETECTED NONE DETECTED   Benzodiazepines POSITIVE (A) NONE DETECTED   Amphetamines NONE DETECTED NONE DETECTED   Tetrahydrocannabinol NONE DETECTED NONE DETECTED   Barbiturates NONE DETECTED NONE DETECTED    Comment: (NOTE) DRUG SCREEN FOR MEDICAL PURPOSES ONLY.  IF CONFIRMATION IS NEEDED FOR ANY PURPOSE, NOTIFY LAB WITHIN 5 DAYS.  LOWEST DETECTABLE LIMITS FOR URINE DRUG SCREEN Drug Class                     Cutoff (ng/mL) Amphetamine and metabolites    1000 Barbiturate and metabolites    200 Benzodiazepine                 200 Opiates and metabolites        300 Cocaine and metabolites        300 THC                            50 Performed at [facility], [address], [place], [place] [address]    CT Head Wo Contrast  Result Date: 03/26/2023 CLINICAL DATA:  Trauma. EXAM: CT HEAD WITHOUT CONTRAST CT MAXILLOFACIAL WITHOUT CONTRAST CT CERVICAL SPINE WITHOUT CONTRAST TECHNIQUE: Multidetector CT imaging of the head, cervical spine, and maxillofacial structures were performed using the  standard protocol without intravenous contrast. Multiplanar CT image reconstructions of the cervical spine and maxillofacial structures were also generated. RADIATION DOSE REDUCTION: This exam was performed according to the departmental dose-optimization program which includes automated exposure control, adjustment of the mA and/or kV according to patient size and/or use of iterative reconstruction technique. COMPARISON:  Head CT dated 03/23/2023. FINDINGS: CT HEAD FINDINGS Brain: The ventricles and sulci are appropriate size for the patient's age. The gray-white matter discrimination is preserved. There is no acute intracranial hemorrhage. No mass effect or midline shift. No extra-axial fluid collection. Vascular: No hyperdense vessel or unexpected calcification. Skull: Normal. Negative for fracture or focal lesion. Other: None CT MAXILLOFACIAL FINDINGS Osseous: No acute fracture. No mandibular subluxation. Multiple dental caries and periapical lucencies. Orbits: Negative. No traumatic or inflammatory finding. Sinuses: Mild mucoperiosteal thickening of paranasal sinuses. No air-fluid level. Mastoid air cells are clear. Soft tissues: Mild contusion of the right face.  No hematoma. CT CERVICAL SPINE FINDINGS Alignment: No acute subluxation. Skull base and vertebrae: No acute fracture. Soft tissues and spinal canal: No prevertebral fluid or swelling. No visible canal hematoma. Disc levels:  No acute findings. Upper chest: Negative. Other: None IMPRESSION: 1. No acute intracranial pathology. 2. No acute/traumatic cervical spine pathology. 3. No acute facial bone fractures. Mild contusion of the right face. Electronically Signed   By: Elgie Collard M.D.   On: 03/26/2023 20:23   CT Cervical Spine Wo Contrast  Result Date: 03/26/2023 CLINICAL DATA:  Trauma.  EXAM: CT HEAD WITHOUT CONTRAST CT MAXILLOFACIAL WITHOUT CONTRAST CT CERVICAL SPINE WITHOUT CONTRAST TECHNIQUE: Multidetector CT imaging of the head, cervical  spine, and maxillofacial structures were performed using the standard protocol without intravenous contrast. Multiplanar CT image reconstructions of the cervical spine and maxillofacial structures were also generated. RADIATION DOSE REDUCTION: This exam was performed according to the departmental dose-optimization program which includes automated exposure control, adjustment of the mA and/or kV according to patient size and/or use of iterative reconstruction technique. COMPARISON:  Head CT dated 03/23/2023. FINDINGS: CT HEAD FINDINGS Brain: The ventricles and sulci are appropriate size for the patient's age. The gray-white matter discrimination is preserved. There is no acute intracranial hemorrhage. No mass effect or midline shift. No extra-axial fluid collection. Vascular: No hyperdense vessel or unexpected calcification. Skull: Normal. Negative for fracture or focal lesion. Other: None CT MAXILLOFACIAL FINDINGS Osseous: No acute fracture. No mandibular subluxation. Multiple dental caries and periapical lucencies. Orbits: Negative. No traumatic or inflammatory finding. Sinuses: Mild mucoperiosteal thickening of paranasal sinuses. No air-fluid level. Mastoid air cells are clear. Soft tissues: Mild contusion of the right face.  No hematoma. CT CERVICAL SPINE FINDINGS Alignment: No acute subluxation. Skull base and vertebrae: No acute fracture. Soft tissues and spinal canal: No prevertebral fluid or swelling. No visible canal hematoma. Disc levels:  No acute findings. Upper chest: Negative. Other: None IMPRESSION: 1. No acute intracranial pathology. 2. No acute/traumatic cervical spine pathology. 3. No acute facial bone fractures. Mild contusion of the right face. Electronically Signed   By: Elgie Collard M.D.   On: 03/26/2023 20:23   CT Maxillofacial Wo Contrast  Result Date: 03/26/2023 CLINICAL DATA:  Trauma. EXAM: CT HEAD WITHOUT CONTRAST CT MAXILLOFACIAL WITHOUT CONTRAST CT CERVICAL SPINE WITHOUT CONTRAST  TECHNIQUE: Multidetector CT imaging of the head, cervical spine, and maxillofacial structures were performed using the standard protocol without intravenous contrast. Multiplanar CT image reconstructions of the cervical spine and maxillofacial structures were also generated. RADIATION DOSE REDUCTION: This exam was performed according to the departmental dose-optimization program which includes automated exposure control, adjustment of the mA and/or kV according to patient size and/or use of iterative reconstruction technique. COMPARISON:  Head CT dated 03/23/2023. FINDINGS: CT HEAD FINDINGS Brain: The ventricles and sulci are appropriate size for the patient's age. The gray-white matter discrimination is preserved. There is no acute intracranial hemorrhage. No mass effect or midline shift. No extra-axial fluid collection. Vascular: No hyperdense vessel or unexpected calcification. Skull: Normal. Negative for fracture or focal lesion. Other: None CT MAXILLOFACIAL FINDINGS Osseous: No acute fracture. No mandibular subluxation. Multiple dental caries and periapical lucencies. Orbits: Negative. No traumatic or inflammatory finding. Sinuses: Mild mucoperiosteal thickening of paranasal sinuses. No air-fluid level. Mastoid air cells are clear. Soft tissues: Mild contusion of the right face.  No hematoma. CT CERVICAL SPINE FINDINGS Alignment: No acute subluxation. Skull base and vertebrae: No acute fracture. Soft tissues and spinal canal: No prevertebral fluid or swelling. No visible canal hematoma. Disc levels:  No acute findings. Upper chest: Negative. Other: None IMPRESSION: 1. No acute intracranial pathology. 2. No acute/traumatic cervical spine pathology. 3. No acute facial bone fractures. Mild contusion of the right face. Electronically Signed   By: Elgie Collard M.D.   On: 03/26/2023 20:23   DG Shoulder Left  Result Date: 03/26/2023 CLINICAL DATA:  Altered mental status, trauma EXAM: LEFT SHOULDER - 2+ VIEW  COMPARISON:  None Available. FINDINGS: No displaced fracture or dislocation is seen. IMPRESSION: No fracture or dislocation is seen  in left shoulder. Electronically Signed   By: Ernie Avena M.D.   On: 03/26/2023 19:59   DG Chest 1 View  Result Date: 03/26/2023 CLINICAL DATA:  Altered mental status EXAM: CHEST  1 VIEW COMPARISON:  03/23/2023 FINDINGS: Cardiac size is within normal limits. There are no signs of pulmonary edema. Increased markings are seen in medial lower lung fields. Rest of the lung fields are clear. There is poor inspiration. There is no pleural effusion or pneumothorax. IMPRESSION: Increased markings in the medial lower lung fields may suggest crowding of bronchovascular structures due to poor inspiration or suggest atelectasis/pneumonia. Electronically Signed   By: Ernie Avena M.D.   On: 03/26/2023 19:58    Pending Labs Unresulted Labs (From admission, onward)    None       Vitals/Pain Today's Vitals   03/26/23 2145 03/26/23 2300 03/26/23 2315 03/26/23 2336  BP: 132/73 (!) 122/104 118/71   Pulse: 79     Resp: 17 16 (!) 21   Temp:    97.7 F (36.5 C)  TempSrc:    Oral  SpO2: 98%     Weight:      Height:      PainSc:        Isolation Precautions No active isolations  Medications Medications  sodium chloride 0.9 % bolus 1,000 mL (0 mLs Intravenous Stopped 03/26/23 2130)  LORazepam (ATIVAN) tablet 1 mg (1 mg Oral Given 03/27/23 0027)    Mobility walks     Focused Assessments Vertigo   R Recommendations: See Admitting Provider Note  Report given to:   Additional Notes: AAOx4, ambulates

## 2023-03-27 NOTE — H&P (Addendum)
History and Physical  Madeline Shaw RUE:[id number] DOB: [birth year] DOA: 03/26/2023  Referring physician: Dr. Criss Alvine, EDP  PCP: [facility]  Outpatient Specialists: Neurology Patient coming from: Lives in a hotel  Chief Complaint: Seizure activity  HPI: Madeline Shaw is a 47 y.o. female with medical history significant for seizure disorder, hyperlipidemia, chronic anxiety/depression, who presented to [facility] ED after a seizure-like activity at the hotel room where she lives.  EMS was activated.  Upon EMS arrival, the patient was combative agitated and kicking.  Her ex-boyfriend was at the scene.  Per EMS, he provided no information.  The patient received a dose of 5 mg of Versed en route via EMS.  The patient was brought into the ED for further evaluation.  In the ED, the patient was hypersomnolent and difficult to arouse, which later improved.  She became more alert and interactive.  Seen by EDP, they attempted to ambulate the patient in the ED but she was deemed unsafe to walk due to balance issues.  EDP requested admission for further evaluation.  The patient was admitted by [facility], hospitalist service, as observation status.  PT OT consulted.  Fall precautions are in place.  ED Course: Tmax 97.7.  BP 149/83, pulse 69, respiratory rate 14, O2 saturation 98% on room air.  Lab studies notable for  Review of Systems: Review of systems as noted in the HPI. All other systems reviewed and are negative.   Past Medical History:  Diagnosis Date   Anxiety    Cancer (HCC)    Depression    Gall stones    Seizures (HCC)    Stroke (HCC)    Vertigo    Past Surgical History:  Procedure Laterality Date   ABDOMINAL HYSTERECTOMY      Social History:  reports that she has quit smoking. Her smoking use included cigarettes. She has never used smokeless tobacco. She reports that she does not currently use alcohol. She reports that she does not currently use drugs after having  used the following drugs: Benzodiazepines.   Allergies  Allergen Reactions   Sumatriptan Anaphylaxis, Rash and Swelling   Acetaminophen Other (See Comments)    Bladder spasms/pressure on bladder  Other Reaction(s): Bladder Spasms (intolerance), Bladder Spasms (intolerance), Other (See Comments)  Other reaction(s): Bladder Spasms (intolerance)  Reaction: Bladder Spasms (intolerance); Other reaction(s): Bladder Spasms (intolerance)   Reaction: Bladder Spasms (intolerance)    Pt denies allergic to tylenol   Chlorpheniramine     Other Reaction(s): Bladder Spasms (intolerance), Other (See Comments)  Bladder spasms  Reaction: Bladder Spasms (intolerance); Bladder spasms   Chlorpheniramine-Dm Other (See Comments)    Bladder spasms - reported by [employer] in 2014  Reaction to Triaminic  Other Reaction(s): Other  Reaction: Bladder Spasms (intolerance); Bladder spasms     Bladder spasms   Topiramate     Other Reaction(s): Other  Unable to take - due to already taking Lamictal /beta-blockers contraindicated due to history of asthma.   Chlorpheniramine Maleate     Other Reaction(s): Bladder Spasms (intolerance)   Dextromethorphan Hbr     Other Reaction(s): Bladder Spasms (intolerance)   Diphenhydramine Hcl     History reviewed. No pertinent family history.    Prior to Admission medications   Medication Sig Start Date End Date Taking? Authorizing Provider  atorvastatin (LIPITOR) 40 MG tablet Take 40 mg by mouth at bedtime. 10/29/22   [provider]  fluvoxaMINE (LUVOX) 50 MG tablet Take 3 tablets  by mouth at bedtime. 03/10/23   [provider]  gabapentin (NEURONTIN) 400 MG capsule Take 1 capsule (400 mg total) by mouth 3 (three) times daily. 03/23/23   Fayrene Helper, MD  hydrOXYzine (VISTARIL) 100 MG capsule Take 100 mg by mouth in the morning, at noon, and at bedtime. 03/10/23   [provider]  LamoTRIgine (LAMICTAL XR) 250 MG TB24 24 hour  tablet Take 500 mg by mouth at bedtime.    [provider]  mirtazapine (REMERON) 15 MG tablet Take 15 mg by mouth at bedtime.    [provider]  prazosin (MINIPRESS) 2 MG capsule Take 2 mg by mouth at bedtime. 09/04/22   [provider]  VENTOLIN HFA 108 (90 Base) MCG/ACT inhaler Inhale 1 puff into the lungs every 6 (six) hours as needed for wheezing or shortness of breath. 01/25/22   [provider]  verapamil (CALAN-SR) 120 MG CR tablet Take 120 mg by mouth daily. 11/18/22   [provider]    Physical Exam: BP (!) 149/83 (BP Location: Right Arm)   Pulse 69   Temp (!) 97.4 F (36.3 C) (Oral)   Resp 14   Ht 5\' 1"  (1.549 m)   Wt 46 kg   SpO2 98%   BMI 19.16 kg/m   General: 47 y.o. year-old female well developed well nourished in no acute distress.  Alert and oriented x3. Cardiovascular: Regular rate and rhythm with no rubs or gallops.  No thyromegaly or JVD noted.  No lower extremity edema. 2/4 pulses in all 4 extremities. Respiratory: Clear to auscultation with no wheezes or rales. Good inspiratory effort. Abdomen: Soft nontender nondistended with normal bowel sounds x4 quadrants. Muskuloskeletal: No cyanosis, clubbing or edema noted bilaterally Neuro: CN II-XII intact, strength, sensation, reflexes Skin: No ulcerative lesions noted or rashes Psychiatry: Judgement and insight appear normal. Mood is appropriate for condition and setting          Labs on Admission:  Basic Metabolic Panel: Recent Labs  Lab 03/23/23 1622 03/23/23 1732 03/26/23 2027  NA 137  --  138  K 3.7  --  3.7  CL 104  --  104  CO2 23  --  25  GLUCOSE 105*  --  100*  BUN 6  --  10  CREATININE 0.98  --  0.97  CALCIUM 9.2  --  9.1  MG  --  1.9  --    Liver Function Tests: Recent Labs  Lab 03/23/23 1732 03/26/23 2027  AST 20 18  ALT 18 17  ALKPHOS 123 110  BILITOT 0.5 0.4  PROT 7.3 7.3  ALBUMIN 4.2 4.0   No results for input(s): "LIPASE", "AMYLASE" in  the last 168 hours. No results for input(s): "AMMONIA" in the last 168 hours. CBC: Recent Labs  Lab 03/23/23 1622 03/26/23 2027  WBC 5.1 4.6  NEUTROABS  --  3.0  HGB 13.0 11.9*  HCT 39.4 36.2  MCV 84.5 85.6  PLT 184 163   Cardiac Enzymes: Recent Labs  Lab 03/23/23 1732  CKTOTAL 147    BNP (last 3 results) No results for input(s): "BNP" in the last 8760 hours.  ProBNP (last 3 results) No results for input(s): "PROBNP" in the last 8760 hours.  CBG: No results for input(s): "GLUCAP" in the last 168 hours.  Radiological Exams on Admission: CT Head Wo Contrast  Result Date: 03/26/2023 CLINICAL DATA:  Trauma. EXAM: CT HEAD WITHOUT CONTRAST CT MAXILLOFACIAL WITHOUT CONTRAST CT CERVICAL SPINE WITHOUT  CONTRAST TECHNIQUE: Multidetector CT imaging of the head, cervical spine, and maxillofacial structures were performed using the standard protocol without intravenous contrast. Multiplanar CT image reconstructions of the cervical spine and maxillofacial structures were also generated. RADIATION DOSE REDUCTION: This exam was performed according to the departmental dose-optimization program which includes automated exposure control, adjustment of the mA and/or kV according to patient size and/or use of iterative reconstruction technique. COMPARISON:  Head CT dated 03/23/2023. FINDINGS: CT HEAD FINDINGS Brain: The ventricles and sulci are appropriate size for the patient's age. The gray-white matter discrimination is preserved. There is no acute intracranial hemorrhage. No mass effect or midline shift. No extra-axial fluid collection. Vascular: No hyperdense vessel or unexpected calcification. Skull: Normal. Negative for fracture or focal lesion. Other: None CT MAXILLOFACIAL FINDINGS Osseous: No acute fracture. No mandibular subluxation. Multiple dental caries and periapical lucencies. Orbits: Negative. No traumatic or inflammatory finding. Sinuses: Mild mucoperiosteal thickening of paranasal  sinuses. No air-fluid level. Mastoid air cells are clear. Soft tissues: Mild contusion of the right face.  No hematoma. CT CERVICAL SPINE FINDINGS Alignment: No acute subluxation. Skull base and vertebrae: No acute fracture. Soft tissues and spinal canal: No prevertebral fluid or swelling. No visible canal hematoma. Disc levels:  No acute findings. Upper chest: Negative. Other: None IMPRESSION: 1. No acute intracranial pathology. 2. No acute/traumatic cervical spine pathology. 3. No acute facial bone fractures. Mild contusion of the right face. Electronically Signed   By: Elgie Collard M.D.   On: 03/26/2023 20:23   CT Cervical Spine Wo Contrast  Result Date: 03/26/2023 CLINICAL DATA:  Trauma. EXAM: CT HEAD WITHOUT CONTRAST CT MAXILLOFACIAL WITHOUT CONTRAST CT CERVICAL SPINE WITHOUT CONTRAST TECHNIQUE: Multidetector CT imaging of the head, cervical spine, and maxillofacial structures were performed using the standard protocol without intravenous contrast. Multiplanar CT image reconstructions of the cervical spine and maxillofacial structures were also generated. RADIATION DOSE REDUCTION: This exam was performed according to the departmental dose-optimization program which includes automated exposure control, adjustment of the mA and/or kV according to patient size and/or use of iterative reconstruction technique. COMPARISON:  Head CT dated 03/23/2023. FINDINGS: CT HEAD FINDINGS Brain: The ventricles and sulci are appropriate size for the patient's age. The gray-white matter discrimination is preserved. There is no acute intracranial hemorrhage. No mass effect or midline shift. No extra-axial fluid collection. Vascular: No hyperdense vessel or unexpected calcification. Skull: Normal. Negative for fracture or focal lesion. Other: None CT MAXILLOFACIAL FINDINGS Osseous: No acute fracture. No mandibular subluxation. Multiple dental caries and periapical lucencies. Orbits: Negative. No traumatic or inflammatory  finding. Sinuses: Mild mucoperiosteal thickening of paranasal sinuses. No air-fluid level. Mastoid air cells are clear. Soft tissues: Mild contusion of the right face.  No hematoma. CT CERVICAL SPINE FINDINGS Alignment: No acute subluxation. Skull base and vertebrae: No acute fracture. Soft tissues and spinal canal: No prevertebral fluid or swelling. No visible canal hematoma. Disc levels:  No acute findings. Upper chest: Negative. Other: None IMPRESSION: 1. No acute intracranial pathology. 2. No acute/traumatic cervical spine pathology. 3. No acute facial bone fractures. Mild contusion of the right face. Electronically Signed   By: Elgie Collard M.D.   On: 03/26/2023 20:23   CT Maxillofacial Wo Contrast  Result Date: 03/26/2023 CLINICAL DATA:  Trauma. EXAM: CT HEAD WITHOUT CONTRAST CT MAXILLOFACIAL WITHOUT CONTRAST CT CERVICAL SPINE WITHOUT CONTRAST TECHNIQUE: Multidetector CT imaging of the head, cervical spine, and maxillofacial structures were performed using the standard protocol without intravenous contrast. Multiplanar CT image reconstructions of  the cervical spine and maxillofacial structures were also generated. RADIATION DOSE REDUCTION: This exam was performed according to the departmental dose-optimization program which includes automated exposure control, adjustment of the mA and/or kV according to patient size and/or use of iterative reconstruction technique. COMPARISON:  Head CT dated 03/23/2023. FINDINGS: CT HEAD FINDINGS Brain: The ventricles and sulci are appropriate size for the patient's age. The gray-white matter discrimination is preserved. There is no acute intracranial hemorrhage. No mass effect or midline shift. No extra-axial fluid collection. Vascular: No hyperdense vessel or unexpected calcification. Skull: Normal. Negative for fracture or focal lesion. Other: None CT MAXILLOFACIAL FINDINGS Osseous: No acute fracture. No mandibular subluxation. Multiple dental caries and periapical  lucencies. Orbits: Negative. No traumatic or inflammatory finding. Sinuses: Mild mucoperiosteal thickening of paranasal sinuses. No air-fluid level. Mastoid air cells are clear. Soft tissues: Mild contusion of the right face.  No hematoma. CT CERVICAL SPINE FINDINGS Alignment: No acute subluxation. Skull base and vertebrae: No acute fracture. Soft tissues and spinal canal: No prevertebral fluid or swelling. No visible canal hematoma. Disc levels:  No acute findings. Upper chest: Negative. Other: None IMPRESSION: 1. No acute intracranial pathology. 2. No acute/traumatic cervical spine pathology. 3. No acute facial bone fractures. Mild contusion of the right face. Electronically Signed   By: Elgie Collard M.D.   On: 03/26/2023 20:23   DG Shoulder Left  Result Date: 03/26/2023 CLINICAL DATA:  Altered mental status, trauma EXAM: LEFT SHOULDER - 2+ VIEW COMPARISON:  None Available. FINDINGS: No displaced fracture or dislocation is seen. IMPRESSION: No fracture or dislocation is seen in left shoulder. Electronically Signed   By: Ernie Avena M.D.   On: 03/26/2023 19:59   DG Chest 1 View  Result Date: 03/26/2023 CLINICAL DATA:  Altered mental status EXAM: CHEST  1 VIEW COMPARISON:  03/23/2023 FINDINGS: Cardiac size is within normal limits. There are no signs of pulmonary edema. Increased markings are seen in medial lower lung fields. Rest of the lung fields are clear. There is poor inspiration. There is no pleural effusion or pneumothorax. IMPRESSION: Increased markings in the medial lower lung fields may suggest crowding of bronchovascular structures due to poor inspiration or suggest atelectasis/pneumonia. Electronically Signed   By: Ernie Avena M.D.   On: 03/26/2023 19:58    EKG: I independently viewed the EKG done and my findings are as followed: Sinus rhythm rate of 79.  Nonspecific ST-T changes, QTc 498.  Assessment/Plan Present on Admission: **None**  Principal Problem:    Generalized weakness  Generalized weakness, post seizure-like activity, possibly postictal Monitor overnight PT OT assessment For precautions  Acute metabolic encephalopathy Possibly due to postictal state Currently back to her baseline mentation Monitor on telemetry  Seizure disorder Seizure-like activity prior to admission Resume home regimen, AEDs IV Ativan as needed for breakthrough seizures  Hyperlipidemia Resume home regimen  Mild hyperglycemia No history of diabetes Monitor for now with BMPs    DVT prophylaxis: Subcu Lovenox daily.  Code Status: Full code.  Family Communication: None at bedside.  Disposition Plan: Admitted to telemetry unit as observation status.  Consults called: None.  Admission status: Observation status.   Status is: Observation    Darlin Drop MD [facility] Pager [phone]  If 7PM-7AM, please contact night-coverage [email or web address] Password [email or web address]  03/27/2023, 1:33 AM

## 2023-03-27 NOTE — Evaluation (Signed)
Physical Therapy Evaluation Patient Details Name: Madeline Shaw MRN: [id number] DOB: [birth year] Today's Date: 03/27/2023  History of Present Illness  Madeline Shaw is a 47 y.o. female who presented after a seizure-like activity at the hotel room where she lives, admitted with generalized weakness. PMH seizure disorder, hyperlipidemia, chronic anxiety/depression  Clinical Impression  Pt admitted with above diagnosis. 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 Pt somewhat completes bed mobility with supv, impulsive with coming to EOB and return to supine, multiple objects mobilizing through the air (personal items, food). Pt impulsively powers to stand with linen falling to floor around her feet, heart monitor falls, and hospital gown not secured as pt walking to restroom with urge to void, min G for safety. No knee buckling or overt LOB, does demo somewhat posterior lean with gait, good bil foot clearance, equal step length. Pt appears to fall asleep while seated EOB speaking to therapist, needing reminded of conversation, pt also reports difficulty remembering train of thought often during session. Pt with questionable safety awareness, difficult to redirect at times. Pt declines further ambulation due to fear of falling. Recommend HHPT with possible no f/u pending improvement. Pt currently with functional limitations due to the deficits listed below (see PT Problem List). Pt will benefit from acute skilled PT to increase their independence and safety with mobility to allow discharge.           Assistance Recommended at Discharge Intermittent Supervision/Assistance  If plan is discharge home, recommend the following:  Can travel by private vehicle  Assistance with cooking/housework;Assist for  transportation;Help with stairs or ramp for entrance        Equipment Recommendations None recommended by PT  Recommendations for Other Services       Functional Status Assessment Patient has had a recent decline in their functional status and demonstrates the ability to make significant improvements in function in a reasonable and predictable amount of time.     Precautions / Restrictions Precautions Precautions: Fall Restrictions Weight Bearing Restrictions: No      Mobility  Bed Mobility Overal bed mobility: Needs Assistance Bed Mobility: Supine to Sit, Sit to Supine     Supine to sit: Supervision Sit to supine: Supervision   General bed mobility comments: pt impulsive, multiple objects in bed (personal items, food) mobilizing through the air as pt coming to sitting EOB and return to supine, no physical assist, verbal cues for safety with objects and linen with poor carryover    Transfers Overall transfer level: Needs assistance Equipment used: None Transfers: Sit to/from Stand Sit to Stand: Min guard           General transfer comment: pt impulsive, rises to stand with linen wrapped around waist and leg, heart monitor falling to floor and hospital gown not fully secured, min G for safety as pt demonstrates posterior lean and pushing objects out of the way    Ambulation/Gait Ambulation/Gait assistance: Min guard Gait Distance (Feet): 12 Feet (x2) Assistive device: None Gait Pattern/deviations: Step-through pattern, Decreased stride length       General Gait Details: min G for safety, pt demo slight posterior lean, appears to have equal bil step length and good bil foot clearance, verbal cues for safety with poor carryover, no knee buckling, no overt LOB  Stairs            Wheelchair Mobility     Tilt Bed    Modified Rankin (Stroke Patients Only)       Balance Overall balance assessment: Mild deficits observed, not formally tested, History of  Falls ("all the time, like 5 in the last week")                                           Pertinent Vitals/Pain Pain Assessment Pain Assessment: No/denies pain    Home Living Family/patient expects to be discharged to:: Unsure                   Additional Comments: pt with conflicting reports of home environment, reports single level apartment, then 3 story apartment, then has a basement; no family/friends present to assist with home setup    Prior Function Prior Level of Function : Independent/Modified Independent;Working/employed             Mobility Comments: pt reports ind without AD, 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 ADLs Comments: pt reports ind     Higher education careers adviser        Extremity/Trunk Assessment   Upper Extremity Assessment Upper Extremity Assessment: Overall WFL for tasks assessed    Lower Extremity Assessment Lower Extremity Assessment: RLE deficits/detail;LLE deficits/detail RLE Deficits / Details: AROM WFL, ankle 5/5, knee flexion 5/5, knee extension 5/5, hip abduction 5/5, hip adduction 5/5 RLE Sensation: WNL RLE Coordination: WNL LLE Deficits / Details: AROM WFL, ankle 5/5, knee flexion 5/5, knee extension 5/5, hip abduction 4+/5, hip adduction 5/5 LLE Sensation: WNL LLE Coordination: WNL    Cervical / Trunk Assessment Cervical / Trunk Assessment: Normal  Communication      Cognition Arousal/Alertness: Awake/alert, Lethargic Behavior During Therapy: Impulsive Overall Cognitive Status: No family/caregiver present to determine baseline cognitive functioning                                 General Comments: pt alert and oriented to self, being in hospital, reports date is 03/25/24 (1 day off). Pt appears to fall asleep while talking to therapist while sitting EOB, needing prompting to continue story. Pt states "what was I doing" multiple times mid activity during session needing redirection. Pt impulsive and  needing redirection often, not always successful. Pt with multiple home set up and PLOF reports.        General Comments      Exercises     Assessment/Plan    PT Assessment Patient needs continued PT services  PT Problem List Decreased activity tolerance;Decreased balance;Decreased knowledge of use of DME;Decreased safety awareness;Decreased skin integrity       PT Treatment Interventions DME instruction;Gait training;Functional mobility training;Therapeutic activities;Therapeutic exercise;Balance training;Neuromuscular re-education;Patient/family education    PT Goals (Current goals can be found in the Care Plan section)  Acute Rehab PT Goals Patient Stated Goal: agreeable to therapy PT Goal Formulation: With patient Time For Goal Achievement: 04/10/23 Potential to Achieve Goals: Good    Frequency Min 1X/week     Co-evaluation               AM-PAC PT "6 Clicks" Mobility  Outcome Measure Help needed turning from your back to your side while in a flat bed without using bedrails?: None Help needed moving from  lying on your back to sitting on the side of a flat bed without using bedrails?: None Help needed moving to and from a bed to a chair (including a wheelchair)?: A Little Help needed standing up from a chair using your arms (e.g., wheelchair or bedside chair)?: A Little Help needed to walk in hospital room?: A Little Help needed climbing 3-5 steps with a railing? : A Little 6 Click Score: 20    End of Session   Activity Tolerance: Patient tolerated treatment well Patient left: in bed;with call bell/phone within reach;with bed alarm set Nurse Communication: Mobility status PT Visit Diagnosis: Unsteadiness on feet (R26.81);Other abnormalities of gait and mobility (R26.89);History of falling (Z91.81)    Time: 4098-1191 PT Time Calculation (min) (ACUTE ONLY): 21 min   Charges:   PT Evaluation $PT Eval Low Complexity: 1 Low   PT General Charges $$ ACUTE PT  VISIT: 1 Visit         Tori Makaiya Geerdes PT, DPT 03/27/23, 12:16 PM

## 2023-03-27 NOTE — Progress Notes (Signed)
Arrived at patient room and attempted EEG.  Patient very disoriented.  Sitting up and trying to pull off leads.   Hallucinating  wanting to hang up shirts.  Unable to complete EEG.  RN notified
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
## 2023-03-28 ENCOUNTER — Inpatient Hospital Stay (HOSPITAL_COMMUNITY)
Admit: 2023-03-28 | Discharge: 2023-03-28 | Disposition: A | Discharge status: Home / Self Care | Payer: Medicaid Other | Attending: Internal Medicine | Admitting: Internal Medicine

## 2023-03-28 DIAGNOSIS — Z79899 Other long term (current) drug therapy: Secondary | ICD-10-CM | POA: Diagnosis not present

## 2023-03-28 DIAGNOSIS — Z886 Allergy status to analgesic agent status: Secondary | ICD-10-CM | POA: Diagnosis not present

## 2023-03-28 DIAGNOSIS — Z8719 Personal history of other diseases of the digestive system: Secondary | ICD-10-CM | POA: Diagnosis not present

## 2023-03-28 DIAGNOSIS — Z90710 Acquired absence of both cervix and uterus: Secondary | ICD-10-CM | POA: Diagnosis not present

## 2023-03-28 DIAGNOSIS — E785 Hyperlipidemia, unspecified: Secondary | ICD-10-CM | POA: Diagnosis present

## 2023-03-28 DIAGNOSIS — G43909 Migraine, unspecified, not intractable, without status migrainosus: Secondary | ICD-10-CM | POA: Diagnosis present

## 2023-03-28 DIAGNOSIS — Z87891 Personal history of nicotine dependence: Secondary | ICD-10-CM | POA: Diagnosis not present

## 2023-03-28 DIAGNOSIS — G40909 Epilepsy, unspecified, not intractable, without status epilepticus: Secondary | ICD-10-CM | POA: Diagnosis present

## 2023-03-28 DIAGNOSIS — Z888 Allergy status to other drugs, medicaments and biological substances status: Secondary | ICD-10-CM | POA: Diagnosis not present

## 2023-03-28 DIAGNOSIS — R569 Unspecified convulsions: Secondary | ICD-10-CM | POA: Diagnosis not present

## 2023-03-28 DIAGNOSIS — Z8673 Personal history of transient ischemic attack (TIA), and cerebral infarction without residual deficits: Secondary | ICD-10-CM | POA: Diagnosis not present

## 2023-03-28 DIAGNOSIS — Z5901 Sheltered homelessness: Secondary | ICD-10-CM | POA: Diagnosis not present

## 2023-03-28 DIAGNOSIS — F319 Bipolar disorder, unspecified: Secondary | ICD-10-CM | POA: Diagnosis present

## 2023-03-28 DIAGNOSIS — R739 Hyperglycemia, unspecified: Secondary | ICD-10-CM | POA: Diagnosis present

## 2023-03-28 DIAGNOSIS — F419 Anxiety disorder, unspecified: Secondary | ICD-10-CM | POA: Diagnosis present

## 2023-03-28 DIAGNOSIS — R531 Weakness: Secondary | ICD-10-CM | POA: Diagnosis not present

## 2023-03-28 DIAGNOSIS — Z7409 Other reduced mobility: Secondary | ICD-10-CM | POA: Diagnosis present

## 2023-03-28 DIAGNOSIS — G9341 Metabolic encephalopathy: Secondary | ICD-10-CM | POA: Diagnosis present

## 2023-03-28 DIAGNOSIS — G4710 Hypersomnia, unspecified: Secondary | ICD-10-CM | POA: Diagnosis present

## 2023-03-28 MED ORDER — NICOTINE 21 MG/24HR TD PT24
21.0000 mg | MEDICATED_PATCH | Freq: Every day | TRANSDERMAL | Status: DC
  Administered 2023-03-28 – 2023-03-29 (×2): 21 mg via TRANSDERMAL
  Filled 2023-03-28 (×2): qty 1

## 2023-03-28 MED ORDER — OXYCODONE HCL 5 MG PO TABS
5.0000 mg | ORAL_TABLET | Freq: Once | ORAL | Status: AC
  Administered 2023-03-28: 5 mg via ORAL
  Filled 2023-03-28: qty 1

## 2023-03-28 NOTE — Procedures (Signed)
Patient Name: CHLORA SONGCO  MRN: [id number]  Epilepsy Attending: Charlsie Quest  Referring Physician/Provider: Lorin Glass, MD  Date: 03/28/2023 Duration: 22.44 mins  Patient history: 47yo F with seizure like activity getting eeg to evaluate for seizure  Level of alertness: Awake, asleep  AEDs during EEG study: LTG, GBP  Technical aspects: This EEG study was done with scalp electrodes positioned according to the 10-20 International system of electrode placement. Electrical activity was reviewed with band pass filter of 1-70Hz , sensitivity of 7 uV/mm, display speed of 27mm/sec with a 60Hz  notched filter applied as appropriate. EEG data were recorded continuously and digitally stored.  Video monitoring was available and reviewed as appropriate.  Description: No clear posterior dominant rhythm was seen. Sleep was characterized by vertex waves, maximal frontocentral region.  EEG showed continuous generalized 3 to 6 Hz theta-delta slowing, at times with triphasic morphology. Hyperventilation and photic stimulation were not performed.      ABNORMALITY - Continuous slow, generalized   IMPRESSION: This study is suggestive of moderate diffuse encephalopathy, nonspecific etiology. No seizures or epileptiform discharges were seen throughout the recording.     Chandrika Sandles Annabelle Harman
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
## 2023-03-28 NOTE — Progress Notes (Signed)
Physical Therapy Treatment Patient Details Name: Madeline Shaw MRN: [id number] DOB: 1976-06-03 Today's Date: 03/28/2023   History of Present Illness Madeline Shaw is a 47 y.o. female who presented after a seizure-like activity at the hotel room where she lives, admitted with generalized weakness. PMH seizure disorder, hyperlipidemia, chronic anxiety/depression    PT Comments  Pt cognition varying, closes eyes and stops talking mid sentence and needs cues to continue, reports being in hotel then realizes she is in hospital, needing redirection to task at hand. Pt appears to lack insight to functional status, need min A with transfers and gait, generally unsteady with narrow BOS and drifting R/L despite cues. Pt reports "I'm doing fine" when cued by therapist for safety. Once returned to room, MD present. No family/friends present to inquire about baseline and assistance available; pt is a high fall risk and is unsafe to d/c home without support.    Assistance Recommended at Discharge Frequent or constant Supervision/Assistance  If plan is discharge home, recommend the following:  Can travel by private vehicle    A little help with walking and/or transfers;A little help with bathing/dressing/bathroom;Assistance with cooking/housework;Assist for transportation;Help with stairs or ramp for entrance      Equipment Recommendations  None recommended by PT    Recommendations for Other Services       Precautions / Restrictions Precautions Precautions: Fall Restrictions Weight Bearing Restrictions: No     Mobility  Bed Mobility Overal bed mobility: Needs Assistance Bed Mobility: Supine to Sit     Supine to sit: Supervision     General bed mobility comments: pt quickly comes to sitting EOB moving linen without regard to objects lying ontop of linen, verbal cues for safety    Transfers Overall transfer level: Needs assistance Equipment used: None Transfers: Sit to/from  Stand Sit to Stand: Min assist           General transfer comment: min A to steady with powering up, BLE pressed against bed, verbal cues for safety with objects in lap and hospital gown    Ambulation/Gait Ambulation/Gait assistance: Min assist Gait Distance (Feet): 150 Feet Assistive device: 1 person hand held assist Gait Pattern/deviations: Step-through pattern, Decreased stride length, Narrow base of support, Drifts right/left Gait velocity: decreased     General Gait Details: min A to steady with ambulation, pt with narrow BOS with feet occasionally touching one another, drifting R/L despite verbal cues, pt reports "I'm doing fine" when cued by therapist, no knee buckling or overt LOB   Stairs             Wheelchair Mobility     Tilt Bed    Modified Rankin (Stroke Patients Only)       Balance Overall balance assessment: Needs assistance, History of Falls Sitting-balance support: Feet supported Sitting balance-Leahy Scale: Good     Standing balance support: During functional activity, Single extremity supported Standing balance-Leahy Scale: Poor Standing balance comment: min G to min A with mobility                            Cognition Arousal/Alertness: Awake/alert, Lethargic Behavior During Therapy: Impulsive Overall Cognitive Status: No family/caregiver present to determine baseline cognitive functioning                                 General Comments: As therapist walks into room, pt asks  to be taken back to her "real" room, upon further discussion pt reports being in a hotel lobby and wanting to return to her room. Pt then realizes she is still in hospital. Pt with moments of lethargy closing her eyes mid sentence, needing cue sto open eyes and cues to finish sentence. Pt with poor safety awareness, impulsive with mobility needing cues and education.        Exercises      General Comments        Pertinent  Vitals/Pain Pain Assessment Pain Assessment: No/denies pain    Home Living                          Prior Function            PT Goals (current goals can now be found in the care plan section) Acute Rehab PT Goals Patient Stated Goal: agreeable to therapy PT Goal Formulation: With patient Time For Goal Achievement: 04/10/23 Potential to Achieve Goals: Good Progress towards PT goals: Progressing toward goals    Frequency    Min 1X/week      PT Plan Current plan remains appropriate    Co-evaluation              AM-PAC PT "6 Clicks" Mobility   Outcome Measure  Help needed turning from your back to your side while in a flat bed without using bedrails?: None Help needed moving from lying on your back to sitting on the side of a flat bed without using bedrails?: None Help needed moving to and from a bed to a chair (including a wheelchair)?: A Little Help needed standing up from a chair using your arms (e.g., wheelchair or bedside chair)?: A Little Help needed to walk in hospital room?: A Little Help needed climbing 3-5 steps with a railing? : A Little 6 Click Score: 20    End of Session Equipment Utilized During Treatment: Gait belt Activity Tolerance: Patient tolerated treatment well Patient left: in bed;with call bell/phone within reach;with bed alarm set;Other (comment) (MD in room) Nurse Communication: Mobility status PT Visit Diagnosis: Unsteadiness on feet (R26.81);Other abnormalities of gait and mobility (R26.89);History of falling (Z91.81)     Time: 0949-1000 PT Time Calculation (min) (ACUTE ONLY): 11 min  Charges:    $Gait Training: 8-22 mins PT General Charges $$ ACUTE PT VISIT: 1 Visit                     Tori Najee Manninen PT, DPT 03/28/23, 10:17 AM

## 2023-03-28 NOTE — TOC Progression Note (Signed)
Transition of Care [facility]) - Progression Note    Patient Details  Name: Madeline Shaw MRN: [id number] Date of Birth: Aug 23, 1976  [facility] CM/SW Contact  Larrie Kass, LCSW Phone Number: 03/28/2023, 9:49 AM  Clinical Narrative:     CSW met with pt at the bedside. Pt is more alert and able to engage in conversation, she reports currently living at the [facility] [address]- [address] [place], [place] [address]. Pt reports she will have a friend pick her up at d/c. Pt reports no DME needs. Discussed HH rec with pt, she is agreeable. [facility] accepted pt for HHPT/OT and will need HH orders, MD was made aware. No further TOC needs TOC sign-off.    Expected Discharge Plan: Home w Home Health Services Barriers to Discharge: Continued Medical Work up  Expected Discharge Plan and Services                                               Social Determinants of Health (SDOH) Interventions SDOH Screenings   Food Insecurity: No Food Insecurity (03/27/2023)  Housing: Low Risk  (03/27/2023)  Transportation Needs: No Transportation Needs (03/27/2023)  Utilities: Not At Risk (03/27/2023)  Alcohol Screen: Low Risk  (04/30/2018)  Financial Resource Strain: Low Risk  (12/05/2022)   Received from [facility]  Physical Activity: Unknown (04/30/2018)  Social Connections: Unknown (10/27/2022)   Received from [facility]  Stress: Stress Concern Present (12/26/2022)   Received from [facility]  Tobacco Use: Medium Risk (03/26/2023)    Readmission Risk Interventions     No data to display

## 2023-03-28 NOTE — Progress Notes (Signed)
EEG complete - results pending 

## 2023-03-29 DIAGNOSIS — R531 Weakness: Secondary | ICD-10-CM | POA: Diagnosis not present

## 2023-03-29 MED ORDER — ONDANSETRON HCL 4 MG/2ML IJ SOLN
4.0000 mg | Freq: Four times a day (QID) | INTRAMUSCULAR | Status: DC | PRN
  Administered 2023-03-29: 4 mg via INTRAVENOUS
  Filled 2023-03-29: qty 2
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
## 2023-11-14 ENCOUNTER — Other Ambulatory Visit: Payer: Self-pay

## 2023-11-14 ENCOUNTER — Emergency Department (HOSPITAL_COMMUNITY)
Admission: EM | Admit: 2023-11-14 | Discharge: 2023-11-14 | Discharge status: Against Medical Advice | Attending: Emergency Medicine | Admitting: Emergency Medicine

## 2023-11-14 DIAGNOSIS — R2681 Unsteadiness on feet: Secondary | ICD-10-CM | POA: Insufficient documentation

## 2023-11-14 DIAGNOSIS — Z5329 Procedure and treatment not carried out because of patient's decision for other reasons: Secondary | ICD-10-CM | POA: Insufficient documentation

## 2023-11-14 DIAGNOSIS — R42 Dizziness and giddiness: Secondary | ICD-10-CM | POA: Diagnosis present

## 2023-11-14 NOTE — ED Notes (Signed)
 Pt stated she was going to leave AMA. Dr. Charm Barges made aware. Attempted to have pt sign AMA form however signature block was not working. Pt understands risks associated with leaving. Pt then decided she wanted to stay. When asked if she was going to stay she states " well they discharged me so I'm just gonna go home." Pt wheeled out to ED lobby.

## 2023-11-14 NOTE — ED Triage Notes (Signed)
 BIBA from home for " uncontrollable movements". Per EMS, pt was lying on the floor and stating she could not get up, when they began to move her, she began waving arms and legs around and saying she has no control over it. 150/90 BP 100 HR 124 cbg 98% room air

## 2023-11-14 NOTE — ED Provider Notes (Signed)
 Madeline Shaw EMERGENCY DEPARTMENT AT [facility] Provider Note   CSN: [id number] Arrival date & time: 11/14/23  1530     History  Chief Complaint  Patient presents with   Shaking    Madeline Shaw is a 48 y.o. female.  He is here with a complaint of continued dizziness and having possible seizures.  She tells me she is seeing double, and has been falling a lot due to her dizziness and her weakness.  She just got out of the hospital 2 days ago after being admitted for 3 days to [facility].  It looks like she had an LP done and an MRI brain thoracic and lumbar spine.  No clear etiology was identified.  No evidence of Guillain-Barr transverse myelitis stroke spinal metastatic disease.  She declined being placed in a skilled nursing facility.  She said she has a neurologist Dr. Hyacinth Meeker in [place] but has not seen them recently because she was in the hospital.  She has been taking her Keppra and Lamictal.  She is very frustrated that nobody can seem to figure out why her symptoms are going on.  The history is provided by the patient.  Dizziness Quality:  Imbalance Progression:  Unchanged Associated symptoms: vision changes   Associated symptoms: no chest pain, no shortness of breath and no vomiting        Home Medications Prior to Admission medications   Medication Sig Start Date End Date Taking? Authorizing Provider  atorvastatin (LIPITOR) 40 MG tablet Take 40 mg by mouth at bedtime. 10/29/22   [provider]  fluvoxaMINE (LUVOX) 50 MG tablet Take 3 tablets by mouth at bedtime. 03/10/23   [provider]  gabapentin (NEURONTIN) 300 MG capsule Take 300 mg by mouth 4 (four) times daily.    [provider]  gabapentin (NEURONTIN) 400 MG capsule Take 1 capsule (400 mg total) by mouth 3 (three) times daily. Patient not taking: Reported on 03/27/2023 03/23/23   Fayrene Helper, MD  hydrOXYzine (VISTARIL) 100 MG capsule Take 100 mg by mouth 3 (three) times  daily as needed for anxiety. 03/10/23   [provider]  LamoTRIgine (LAMICTAL XR) 250 MG TB24 24 hour tablet Take 500 mg by mouth daily.    [provider]  meclizine (ANTIVERT) 12.5 MG tablet Take 12.5 mg by mouth 3 (three) times daily as needed for dizziness.    [provider]  mirtazapine (REMERON) 15 MG tablet Take 15 mg by mouth at bedtime as needed (sleep).    [provider]  prazosin (MINIPRESS) 2 MG capsule Take 2 mg by mouth at bedtime. 09/04/22   [provider]  VENTOLIN HFA 108 (90 Base) MCG/ACT inhaler Inhale 1 puff into the lungs every 6 (six) hours as needed for wheezing or shortness of breath. 01/25/22   [provider]  verapamil (CALAN-SR) 120 MG CR tablet Take 120 mg by mouth daily. 11/18/22   [provider]      Allergies    Sumatriptan, Acetaminophen, Chlorpheniramine, Topiramate, Dextromethorphan hbr, and Diphenhydramine hcl    Review of Systems   Review of Systems  Respiratory:  Negative for shortness of breath.   Cardiovascular:  Negative for chest pain.  Gastrointestinal:  Negative for vomiting.  Neurological:  Positive for dizziness.    Physical Exam Updated Vital Signs BP (!) 163/99 (BP Location: Right Arm)   Pulse 97   Temp 98.1 F (36.7 C) (Oral)   Resp 18   Ht 5\' 1"  (  1.549 m)   Wt 45.4 kg   SpO2 98%   BMI 18.89 kg/m  Physical Exam Vitals and nursing note reviewed.  Constitutional:      General: She is not in acute distress.    Appearance: Normal appearance. She is well-developed.  HENT:     Head: Normocephalic and atraumatic.  Eyes:     Conjunctiva/sclera: Conjunctivae normal.  Cardiovascular:     Rate and Rhythm: Normal rate and regular rhythm.     Heart sounds: No murmur heard. Pulmonary:     Effort: Pulmonary effort is normal. No respiratory distress.     Breath sounds: Normal breath sounds.  Abdominal:     Palpations: Abdomen is soft.     Tenderness: There is no abdominal  tenderness. There is no guarding or rebound.  Musculoskeletal:        General: No swelling.     Cervical back: Neck supple.  Skin:    General: Skin is warm and dry.     Capillary Refill: Capillary refill takes less than 2 seconds.  Neurological:     General: No focal deficit present.     Mental Status: She is alert and oriented to person, place, and time.     Cranial Nerves: No cranial nerve deficit.     Sensory: No sensory deficit.     Motor: No weakness.     ED Results / Procedures / Treatments   Labs (all labs ordered are listed, but only abnormal results are displayed) Labs Reviewed - No data to display  EKG None  Radiology No results found.  Procedures Procedures    Medications Ordered in ED Medications - No data to display  ED Course/ Medical Decision Making/ A&P Clinical Course as of 11/15/23 1129  Fri Nov 14, 2023  1741 I  reviewed patient's discharge summary from [facility] 2 days ago.  She had a fairly extensive workup and it does not sound like any clear etiology was identified.  She said all they recommendation was physical therapy.   [MB]  1805 Patient now wants to leave.  She is not steady on her feet.  She does have a clear sensorium however and she said her ride is here.  Recommended she follow-up with her neurologist and physical therapy.  Return instructions discussed [MB]  1817 Patient signed out AMA and now has decided she feels worse and wants to be seen.  They are moving her back into her bed. [MB]  1842 Patient is now apparently leaving again. [MB]    Clinical Course User Index [MB] Terrilee Files, MD                                 Medical Decision Making  This patient complains of weakness shakiness blurry vision; this involves an extensive number of treatment Options and is a complaint that carries with it a high risk of complications and morbidity. The differential includes seizures, stroke, neurologic syndrome,  Previous records  obtained and reviewed recent discharge summary in epic Social determinants considered, patient with social isolation and financial insecurity transportation insecurity Critical Interventions: None  After the interventions stated above, I reevaluated the patient and found patient is leaving AGAINST MEDICAL ADVICE Admission and further testing considered, recommend that she follow-up outpatient with her neurologic provider for continued workup.  Return instructions discussed         Final Clinical Impression(s) / ED Diagnoses Final diagnoses:  Unsteady gait when walking    Rx / DC Orders ED Discharge Orders     None         Terrilee Files, MD 11/15/23 1130

## 2023-11-14 NOTE — Discharge Instructions (Signed)
 Please follow-up with your neurologist.  Continue your regular medications.  You would benefit from physical therapy as you are very unsteady on your feet.

## 2023-11-19 ENCOUNTER — Encounter (HOSPITAL_COMMUNITY): Payer: Self-pay | Admitting: Emergency Medicine

## 2023-11-19 ENCOUNTER — Emergency Department (HOSPITAL_COMMUNITY)
Admission: EM | Admit: 2023-11-19 | Discharge: 2023-11-19 | Disposition: A | Discharge status: Home / Self Care | Attending: Emergency Medicine | Admitting: Emergency Medicine

## 2023-11-19 ENCOUNTER — Other Ambulatory Visit: Payer: Self-pay

## 2023-11-19 DIAGNOSIS — R531 Weakness: Secondary | ICD-10-CM | POA: Insufficient documentation

## 2023-11-19 DIAGNOSIS — R569 Unspecified convulsions: Secondary | ICD-10-CM | POA: Insufficient documentation

## 2023-11-19 DIAGNOSIS — F315 Bipolar disorder, current episode depressed, severe, with psychotic features: Secondary | ICD-10-CM | POA: Diagnosis not present

## 2023-11-19 DIAGNOSIS — Z8673 Personal history of transient ischemic attack (TIA), and cerebral infarction without residual deficits: Secondary | ICD-10-CM | POA: Insufficient documentation

## 2023-11-19 DIAGNOSIS — F445 Conversion disorder with seizures or convulsions: Secondary | ICD-10-CM | POA: Insufficient documentation

## 2023-11-19 DIAGNOSIS — Z5900 Homelessness unspecified: Secondary | ICD-10-CM | POA: Insufficient documentation

## 2023-11-19 DIAGNOSIS — R4689 Other symptoms and signs involving appearance and behavior: Secondary | ICD-10-CM | POA: Diagnosis not present

## 2023-11-19 DIAGNOSIS — F1320 Sedative, hypnotic or anxiolytic dependence, uncomplicated: Secondary | ICD-10-CM | POA: Insufficient documentation

## 2023-11-19 DIAGNOSIS — R45851 Suicidal ideations: Secondary | ICD-10-CM | POA: Insufficient documentation

## 2023-11-19 DIAGNOSIS — F319 Bipolar disorder, unspecified: Secondary | ICD-10-CM | POA: Insufficient documentation

## 2023-11-19 DIAGNOSIS — F419 Anxiety disorder, unspecified: Secondary | ICD-10-CM | POA: Insufficient documentation

## 2023-11-19 LAB — COMPREHENSIVE METABOLIC PANEL
ALT: 18 U/L (ref 0–44)
AST: 31 U/L (ref 15–41)
Albumin: 4.5 g/dL (ref 3.5–5.0)
Alkaline Phosphatase: 89 U/L (ref 38–126)
Anion gap: 14 (ref 5–15)
BUN: 17 mg/dL (ref 6–20)
CO2: 24 mmol/L (ref 22–32)
Calcium: 10.1 mg/dL (ref 8.9–10.3)
Chloride: 100 mmol/L (ref 98–111)
Creatinine, Ser: 0.88 mg/dL (ref 0.44–1.00)
GFR, Estimated: >60 mL/min (ref >60)
Glucose, Bld: 110 mg/dL — ABNORMAL HIGH (ref 70–99)
Potassium: 4.3 mmol/L (ref 3.5–5.1)
Sodium: 138 mmol/L (ref 135–145)
Total Bilirubin: 0.8 mg/dL (ref 0.0–1.2)
Total Protein: 7.5 g/dL (ref 6.5–8.1)

## 2023-11-19 LAB — HCG, SERUM, QUALITATIVE: Preg, Serum: NEGATIVE

## 2023-11-19 LAB — I-STAT CG4 LACTIC ACID, ED
Lactic Acid, Venous: 0.8 mmol/L (ref 0.5–1.9)
Lactic Acid, Venous: 0.8 mmol/L (ref 0.5–1.9)

## 2023-11-19 MED ORDER — LEVETIRACETAM IN NACL 1000 MG/100ML IV SOLN
1000.0000 mg | Freq: Once | INTRAVENOUS | Status: AC
  Administered 2023-11-19: 1000 mg via INTRAVENOUS
  Filled 2023-11-19: qty 100

## 2023-11-19 MED ORDER — SODIUM CHLORIDE 0.9 % IV BOLUS
1000.0000 mL | Freq: Once | INTRAVENOUS | Status: AC
  Administered 2023-11-19: 1000 mL via INTRAVENOUS

## 2023-11-19 NOTE — ED Provider Notes (Signed)
 [facility] EMERGENCY DEPARTMENT AT [facility] Provider Note   CSN: [id number] Arrival date & time: 11/19/23  1523     History  Chief Complaint  Patient presents with   Seizures    Madeline Shaw is a 48 y.o. female.  With a history of stroke, seizure disorder, migraine headaches, anxiety who presents to the ED after a reported seizure.  Patient was seen recently in the [facility] emergency department on April 15 after a seizure.  After her workup was completed she reportedly threw herself onto the floor struck her head on the trash can and caused a laceration which was repaired in the ED.  Psychiatric consultation was obtained due to self harming behaviors and she was also cleared from a psychiatric standpoint with plan for outpatient psychiatric follow-up.  She returns today after another reported seizure.  She does report compliance with her Keppra.  Here in the ED she denies current headaches, fevers, chills, nausea, vomiting, chest pain, shortness of breath   Seizures      Home Medications Prior to Admission medications   Medication Sig Start Date End Date Taking? Authorizing Provider  atorvastatin (LIPITOR) 40 MG tablet Take 40 mg by mouth at bedtime. 10/29/22   [provider]  fluvoxaMINE (LUVOX) 50 MG tablet Take 3 tablets by mouth at bedtime. 03/10/23   [provider]  gabapentin (NEURONTIN) 300 MG capsule Take 300 mg by mouth 4 (four) times daily.    [provider]  gabapentin (NEURONTIN) 400 MG capsule Take 1 capsule (400 mg total) by mouth 3 (three) times daily. Patient not taking: Reported on 03/27/2023 03/23/23   Fayrene Helper, MD  hydrOXYzine (VISTARIL) 100 MG capsule Take 100 mg by mouth 3 (three) times daily as needed for anxiety. 03/10/23   [provider]  LamoTRIgine (LAMICTAL XR) 250 MG TB24 24 hour tablet Take 500 mg by mouth daily.    [provider]  meclizine (ANTIVERT) 12.5 MG tablet  Take 12.5 mg by mouth 3 (three) times daily as needed for dizziness.    [provider]  mirtazapine (REMERON) 15 MG tablet Take 15 mg by mouth at bedtime as needed (sleep).    [provider]  prazosin (MINIPRESS) 2 MG capsule Take 2 mg by mouth at bedtime. 09/04/22   [provider]  VENTOLIN HFA 108 (90 Base) MCG/ACT inhaler Inhale 1 puff into the lungs every 6 (six) hours as needed for wheezing or shortness of breath. 01/25/22   [provider]  verapamil (CALAN-SR) 120 MG CR tablet Take 120 mg by mouth daily. 11/18/22   [provider]      Allergies    Sumatriptan, Acetaminophen, Chlorpheniramine, Topiramate, Dextromethorphan hbr, and Diphenhydramine hcl    Review of Systems   Review of Systems  Neurological:  Positive for seizures.    Physical Exam Updated Vital Signs BP (!) 143/92   Pulse 95   Temp 98.1 F (36.7 C) (Oral)   Resp 20   SpO2 100%  Physical Exam Vitals and nursing note reviewed.  HENT:     Head: Normocephalic and atraumatic.     Comments: Old ecchymosis over left periorbital region along with healing laceration over left frontal scalp with 2 staples in place Eyes:     Extraocular Movements: Extraocular movements intact.     Pupils: Pupils are equal, round, and reactive to light.  Cardiovascular:     Rate and Rhythm: Normal rate and regular rhythm.  Pulmonary:     Effort: Pulmonary effort is normal.     Breath sounds: Normal breath sounds.  Abdominal:     Palpations: Abdomen is soft.     Tenderness: There is no abdominal tenderness.  Skin:    General: Skin is warm and dry.  Neurological:     General: No focal deficit present.     Mental Status: She is alert and oriented to person, place, and time.     Sensory: No sensory deficit.     Motor: No weakness.  Psychiatric:        Mood and Affect: Mood normal.     ED Results / Procedures / Treatments   Labs (all labs ordered are listed, but only abnormal  results are displayed) Labs Reviewed  COMPREHENSIVE METABOLIC PANEL - Abnormal; Notable for the following components:      Result Value   Glucose, Bld 110 (*)    All other components within normal limits  HCG, SERUM, QUALITATIVE  CBC WITH DIFFERENTIAL/PLATELET  CBC WITH DIFFERENTIAL/PLATELET  I-STAT CG4 LACTIC ACID, ED  I-STAT CG4 LACTIC ACID, ED    EKG EKG Interpretation Date/Time:  Wednesday November 19 2023 16:37:29 EDT Ventricular Rate:  100 PR Interval:  150 QRS Duration:  87 QT Interval:  370 QTC Calculation: 478 R Axis:   91  Text Interpretation: Sinus tachycardia Borderline right axis deviation Low voltage, precordial leads Abnormal inferior Q waves Confirmed by Estelle June [phone]) on 11/19/2023 5:45:24 PM  Radiology No results found.  Procedures Procedures    Medications Ordered in ED Medications  sodium chloride 0.9 % bolus 1,000 mL (0 mLs Intravenous Stopped 11/19/23 1837)  levETIRAcetam (KEPPRA) IVPB 1000 mg/100 mL premix (0 mg Intravenous Stopped 11/19/23 2002)    ED Course/ Medical Decision Making/ A&P Clinical Course as of 11/19/23 2105  Wed Nov 19, 2023  2103 Laboratory workup unremarkable.  No elevation in venous lactate.  Keppra has been given.  Patient reported that she felt another seizure coming on but then felt better after receiving 10 cc of normal saline IV push.  We have explained that her workup is unremarkable and that she does not need to stay in the hospital.  Similarly to her prior ED visit at another facility she became very discontented but this.  She was just cleared by psychiatry the other day and does not need another psychiatric evaluation as she denied SI HI when she first came in.  We will discharge her with plan for PCP follow-up [MP]    Clinical Course User Index [MP] Royanne Foots, DO                                 Medical Decision Making 48 year old female with history as above returns to the emergency department after  reported seizure.  Was seen 4 days ago at [facility] ED after reported seizure.  Was medically cleared and psychiatrically cleared at that time.  Reports compliance with her Keppra.  Has a history of bipolar disorder but is not currently on medication for this and does not have an outpatient psychiatric provider.  She is neurologically intact upon my exam.  Will obtain pregnancy test along with basic laboratory workup to look for underlying metabolic cause of potential seizure and give her IV Keppra with the hope of preventing further seizures today.  Amount and/or Complexity of Data Reviewed Labs: ordered.  Risk Prescription drug  management.           Final Clinical Impression(s) / ED Diagnoses Final diagnoses:  Seizure-like activity (HCC)  Combative behavior    Rx / DC Orders ED Discharge Orders     None         Royanne Foots, DO 11/19/23 2105

## 2023-11-19 NOTE — ED Triage Notes (Addendum)
 BIB EMS for possible "seizure activity." EMS was called by hotel staff. Per EMS, pt remained oriented during event. Alert to voice on arrival. Pt reports taking Keppra for seizure disorder. Cooperative with requests after multiple attempts. Reports HA with light sensitivity.

## 2023-11-19 NOTE — Discharge Instructions (Signed)
 You were seen in the Emergency Department after seizure-like activity We gave you a dose of Keppra You should continue taking Keppra Fortunately your blood work looked okay and you do not need to stay in the hospital Please follow-up with your primary care doctor and neurology team Return to the emergency department for recurrent seizures or if you have thoughts of harming yourself or others

## 2023-11-20 ENCOUNTER — Emergency Department (HOSPITAL_COMMUNITY)

## 2023-11-20 ENCOUNTER — Ambulatory Visit (HOSPITAL_COMMUNITY)
Admission: EM | Admit: 2023-11-20 | Discharge: 2023-11-20 | Disposition: A | Discharge status: Other Acute Inpatient Hospital | Payer: Self-pay | Source: Home / Self Care

## 2023-11-20 ENCOUNTER — Inpatient Hospital Stay
Admission: AD | Admit: 2023-11-20 | Discharge: 2023-11-28 | DRG: 885 | Disposition: A | Discharge status: Home / Self Care | Source: Intra-hospital | Attending: Psychiatry | Admitting: Psychiatry

## 2023-11-20 ENCOUNTER — Encounter (HOSPITAL_COMMUNITY): Payer: Self-pay

## 2023-11-20 ENCOUNTER — Encounter: Payer: Self-pay | Admitting: Psychiatry

## 2023-11-20 ENCOUNTER — Other Ambulatory Visit: Payer: Self-pay

## 2023-11-20 ENCOUNTER — Emergency Department (HOSPITAL_COMMUNITY)
Admission: EM | Admit: 2023-11-20 | Discharge: 2023-11-20 | Disposition: A | Discharge status: Other Acute Inpatient Hospital | Attending: Emergency Medicine | Admitting: Emergency Medicine

## 2023-11-20 DIAGNOSIS — R45851 Suicidal ideations: Secondary | ICD-10-CM | POA: Insufficient documentation

## 2023-11-20 DIAGNOSIS — F439 Reaction to severe stress, unspecified: Secondary | ICD-10-CM

## 2023-11-20 DIAGNOSIS — S0512XA Contusion of eyeball and orbital tissues, left eye, initial encounter: Secondary | ICD-10-CM | POA: Insufficient documentation

## 2023-11-20 DIAGNOSIS — Z859 Personal history of malignant neoplasm, unspecified: Secondary | ICD-10-CM | POA: Insufficient documentation

## 2023-11-20 DIAGNOSIS — Z5986 Financial insecurity: Secondary | ICD-10-CM | POA: Diagnosis not present

## 2023-11-20 DIAGNOSIS — F319 Bipolar disorder, unspecified: Principal | ICD-10-CM | POA: Diagnosis present

## 2023-11-20 DIAGNOSIS — Z635 Disruption of family by separation and divorce: Secondary | ICD-10-CM

## 2023-11-20 DIAGNOSIS — Z8673 Personal history of transient ischemic attack (TIA), and cerebral infarction without residual deficits: Secondary | ICD-10-CM | POA: Diagnosis not present

## 2023-11-20 DIAGNOSIS — R54 Age-related physical debility: Secondary | ICD-10-CM | POA: Diagnosis present

## 2023-11-20 DIAGNOSIS — Z818 Family history of other mental and behavioral disorders: Secondary | ICD-10-CM

## 2023-11-20 DIAGNOSIS — G40909 Epilepsy, unspecified, not intractable, without status epilepticus: Secondary | ICD-10-CM | POA: Diagnosis present

## 2023-11-20 DIAGNOSIS — R531 Weakness: Secondary | ICD-10-CM

## 2023-11-20 DIAGNOSIS — F315 Bipolar disorder, current episode depressed, severe, with psychotic features: Secondary | ICD-10-CM | POA: Diagnosis present

## 2023-11-20 DIAGNOSIS — Z993 Dependence on wheelchair: Secondary | ICD-10-CM | POA: Diagnosis not present

## 2023-11-20 DIAGNOSIS — J45909 Unspecified asthma, uncomplicated: Secondary | ICD-10-CM | POA: Diagnosis present

## 2023-11-20 DIAGNOSIS — X58XXXA Exposure to other specified factors, initial encounter: Secondary | ICD-10-CM | POA: Insufficient documentation

## 2023-11-20 DIAGNOSIS — Z604 Social exclusion and rejection: Secondary | ICD-10-CM | POA: Diagnosis present

## 2023-11-20 DIAGNOSIS — F3130 Bipolar disorder, current episode depressed, mild or moderate severity, unspecified: Secondary | ICD-10-CM | POA: Diagnosis present

## 2023-11-20 DIAGNOSIS — Z5982 Transportation insecurity: Secondary | ICD-10-CM

## 2023-11-20 DIAGNOSIS — Z560 Unemployment, unspecified: Secondary | ICD-10-CM

## 2023-11-20 DIAGNOSIS — Z765 Malingerer [conscious simulation]: Secondary | ICD-10-CM | POA: Diagnosis not present

## 2023-11-20 DIAGNOSIS — F418 Other specified anxiety disorders: Secondary | ICD-10-CM | POA: Insufficient documentation

## 2023-11-20 DIAGNOSIS — F1990 Other psychoactive substance use, unspecified, uncomplicated: Secondary | ICD-10-CM | POA: Diagnosis not present

## 2023-11-20 DIAGNOSIS — Z4802 Encounter for removal of sutures: Secondary | ICD-10-CM | POA: Diagnosis not present

## 2023-11-20 DIAGNOSIS — Z87898 Personal history of other specified conditions: Secondary | ICD-10-CM

## 2023-11-20 DIAGNOSIS — Z5941 Food insecurity: Secondary | ICD-10-CM | POA: Diagnosis not present

## 2023-11-20 DIAGNOSIS — F17290 Nicotine dependence, other tobacco product, uncomplicated: Secondary | ICD-10-CM | POA: Diagnosis present

## 2023-11-20 DIAGNOSIS — F322 Major depressive disorder, single episode, severe without psychotic features: Secondary | ICD-10-CM | POA: Diagnosis present

## 2023-11-20 DIAGNOSIS — Z5900 Homelessness unspecified: Secondary | ICD-10-CM

## 2023-11-20 DIAGNOSIS — F4310 Post-traumatic stress disorder, unspecified: Secondary | ICD-10-CM | POA: Diagnosis present

## 2023-11-20 DIAGNOSIS — Z5901 Sheltered homelessness: Secondary | ICD-10-CM | POA: Diagnosis not present

## 2023-11-20 DIAGNOSIS — F411 Generalized anxiety disorder: Secondary | ICD-10-CM | POA: Diagnosis present

## 2023-11-20 DIAGNOSIS — Z79899 Other long term (current) drug therapy: Secondary | ICD-10-CM

## 2023-11-20 DIAGNOSIS — F1320 Sedative, hypnotic or anxiolytic dependence, uncomplicated: Secondary | ICD-10-CM | POA: Diagnosis present

## 2023-11-20 DIAGNOSIS — F429 Obsessive-compulsive disorder, unspecified: Secondary | ICD-10-CM | POA: Diagnosis present

## 2023-11-20 DIAGNOSIS — F419 Anxiety disorder, unspecified: Secondary | ICD-10-CM

## 2023-11-20 DIAGNOSIS — N3001 Acute cystitis with hematuria: Secondary | ICD-10-CM

## 2023-11-20 DIAGNOSIS — S0502XA Injury of conjunctiva and corneal abrasion without foreign body, left eye, initial encounter: Secondary | ICD-10-CM | POA: Diagnosis present

## 2023-11-20 LAB — COMPREHENSIVE METABOLIC PANEL
ALT: 18 U/L (ref 0–44)
AST: 27 U/L (ref 15–41)
Albumin: 4.5 g/dL (ref 3.5–5.0)
Alkaline Phosphatase: 77 U/L (ref 38–126)
Anion gap: 12 (ref 5–15)
BUN: 16 mg/dL (ref 6–20)
CO2: 26 mmol/L (ref 22–32)
Calcium: 9.3 mg/dL (ref 8.9–10.3)
Chloride: 102 mmol/L (ref 98–111)
Creatinine, Ser: 0.74 mg/dL (ref 0.44–1.00)
GFR, Estimated: >60 mL/min (ref >60)
Glucose, Bld: 86 mg/dL (ref 70–99)
Potassium: 3.8 mmol/L (ref 3.5–5.1)
Sodium: 140 mmol/L (ref 135–145)
Total Bilirubin: 1 mg/dL (ref 0.0–1.2)
Total Protein: 7.3 g/dL (ref 6.5–8.1)

## 2023-11-20 LAB — URINALYSIS, ROUTINE W REFLEX MICROSCOPIC
Bilirubin Urine: NEGATIVE
Glucose, UA: NEGATIVE mg/dL
Ketones, ur: 20 mg/dL — AB
Nitrite: NEGATIVE
Protein, ur: 30 mg/dL — AB
Specific Gravity, Urine: 1.021 (ref 1.005–1.030)
WBC, UA: >50 WBC/hpf (ref 0–5)
pH: 5 (ref 5.0–8.0)

## 2023-11-20 LAB — RAPID URINE DRUG SCREEN, HOSP PERFORMED
Amphetamines: NOT DETECTED
Barbiturates: NOT DETECTED
Benzodiazepines: POSITIVE — AB
Cocaine: NOT DETECTED
Opiates: NOT DETECTED
Tetrahydrocannabinol: NOT DETECTED

## 2023-11-20 LAB — CBC WITH DIFFERENTIAL/PLATELET
Abs Immature Granulocytes: 0.03 10*3/uL (ref 0.00–0.07)
Basophils Absolute: 0 10*3/uL (ref 0.0–0.1)
Basophils Relative: 0 %
Eosinophils Absolute: 0 10*3/uL (ref 0.0–0.5)
Eosinophils Relative: 0 %
HCT: 36.6 % (ref 36.0–46.0)
Hemoglobin: 12.3 g/dL (ref 12.0–15.0)
Immature Granulocytes: 1 %
Lymphocytes Relative: 29 %
Lymphs Abs: 1.3 10*3/uL (ref 0.7–4.0)
MCH: 29.9 pg (ref 26.0–34.0)
MCHC: 33.6 g/dL (ref 30.0–36.0)
MCV: 88.8 fL (ref 80.0–100.0)
Monocytes Absolute: 0.4 10*3/uL (ref 0.1–1.0)
Monocytes Relative: 10 %
Neutro Abs: 2.7 10*3/uL (ref 1.7–7.7)
Neutrophils Relative %: 60 %
Platelets: 235 10*3/uL (ref 150–400)
RBC: 4.12 MIL/uL (ref 3.87–5.11)
RDW: 11.6 % (ref 11.5–15.5)
WBC: 4.5 10*3/uL (ref 4.0–10.5)
nRBC: 0 % (ref 0.0–0.2)

## 2023-11-20 LAB — LIPASE, BLOOD: Lipase: 26 U/L (ref 11–51)

## 2023-11-20 LAB — TROPONIN I (HIGH SENSITIVITY): Troponin I (High Sensitivity): 3 ng/L (ref <18)

## 2023-11-20 LAB — PREGNANCY, URINE: Preg Test, Ur: NEGATIVE

## 2023-11-20 MED ORDER — OLANZAPINE 10 MG IM SOLR: 10.0000 mg | Freq: Three times a day (TID) | INTRAMUSCULAR | Status: DC | PRN

## 2023-11-20 MED ORDER — MIRTAZAPINE 15 MG PO TABS
15.0000 mg | ORAL_TABLET | Freq: Every evening | ORAL | Status: DC | PRN
  Administered 2023-11-24: 15 mg via ORAL
  Filled 2023-11-20: qty 1

## 2023-11-20 MED ORDER — OLANZAPINE 10 MG IM SOLR: 5.0000 mg | Freq: Three times a day (TID) | INTRAMUSCULAR | Status: DC | PRN

## 2023-11-20 MED ORDER — FLUVOXAMINE MALEATE 50 MG PO TABS
150.0000 mg | ORAL_TABLET | Freq: Every day | ORAL | Status: DC
  Administered 2023-11-21 – 2023-11-27 (×6): 150 mg via ORAL
  Filled 2023-11-20 (×8): qty 3

## 2023-11-20 MED ORDER — HYDROXYZINE HCL 50 MG PO TABS
100.0000 mg | ORAL_TABLET | Freq: Three times a day (TID) | ORAL | Status: DC | PRN
  Administered 2023-11-20 – 2023-11-28 (×11): 100 mg via ORAL
  Filled 2023-11-20 (×12): qty 2

## 2023-11-20 MED ORDER — FOSFOMYCIN TROMETHAMINE 3 G PO PACK
3.0000 g | PACK | Freq: Once | ORAL | Status: AC
  Administered 2023-11-20: 3 g via ORAL
  Filled 2023-11-20: qty 3

## 2023-11-20 MED ORDER — LEVETIRACETAM 500 MG PO TABS
1000.0000 mg | ORAL_TABLET | Freq: Two times a day (BID) | ORAL | Status: DC
  Administered 2023-11-20 (×3): 1000 mg via ORAL
  Filled 2023-11-20 (×3): qty 2

## 2023-11-20 MED ORDER — MIRTAZAPINE 7.5 MG PO TABS
15.0000 mg | ORAL_TABLET | Freq: Every evening | ORAL | Status: DC | PRN
  Administered 2023-11-20: 15 mg via ORAL
  Filled 2023-11-20: qty 2

## 2023-11-20 MED ORDER — FLUVOXAMINE MALEATE 50 MG PO TABS
150.0000 mg | ORAL_TABLET | Freq: Every day | ORAL | Status: DC
  Administered 2023-11-20: 150 mg via ORAL
  Filled 2023-11-20: qty 3

## 2023-11-20 MED ORDER — OLANZAPINE 5 MG PO TBDP
5.0000 mg | ORAL_TABLET | Freq: Three times a day (TID) | ORAL | Status: DC | PRN
  Administered 2023-11-25 – 2023-11-28 (×4): 5 mg via ORAL
  Filled 2023-11-20 (×4): qty 1

## 2023-11-20 MED ORDER — LORAZEPAM 1 MG PO TABS
1.0000 mg | ORAL_TABLET | Freq: Once | ORAL | Status: AC
  Administered 2023-11-20: 1 mg via ORAL
  Filled 2023-11-20: qty 1

## 2023-11-20 MED ORDER — HYDROXYZINE HCL 25 MG PO TABS
100.0000 mg | ORAL_TABLET | Freq: Three times a day (TID) | ORAL | Status: DC | PRN
  Administered 2023-11-20: 100 mg via ORAL
  Filled 2023-11-20: qty 4

## 2023-11-20 MED ORDER — LAMOTRIGINE ER 50 MG PO TB24
250.0000 mg | ORAL_TABLET | Freq: Every day | ORAL | Status: DC
  Filled 2023-11-20: qty 1

## 2023-11-20 MED ORDER — PRAZOSIN HCL 2 MG PO CAPS
2.0000 mg | ORAL_CAPSULE | Freq: Every day | ORAL | Status: DC
Start: 2023-11-21 — End: 2023-11-28
  Administered 2023-11-22 – 2023-11-27 (×4): 2 mg via ORAL
  Filled 2023-11-20 (×5): qty 1

## 2023-11-20 MED ORDER — LAMOTRIGINE ER 50 MG PO TB24
250.0000 mg | ORAL_TABLET | Freq: Every day | ORAL | Status: DC
  Administered 2023-11-21 – 2023-11-28 (×8): 250 mg via ORAL
  Filled 2023-11-20 (×9): qty 1

## 2023-11-20 MED ORDER — MAGNESIUM HYDROXIDE 400 MG/5ML PO SUSP: 30.0000 mL | Freq: Every day | ORAL | Status: DC | PRN

## 2023-11-20 MED ORDER — PRAZOSIN HCL 1 MG PO CAPS
2.0000 mg | ORAL_CAPSULE | Freq: Every day | ORAL | Status: DC
  Administered 2023-11-20: 2 mg via ORAL
  Filled 2023-11-20: qty 2

## 2023-11-20 MED ORDER — ALUM & MAG HYDROXIDE-SIMETH 200-200-20 MG/5ML PO SUSP: 30.0000 mL | ORAL | Status: DC | PRN

## 2023-11-20 MED ORDER — LAMOTRIGINE ER 50 MG PO TB24
250.0000 mg | ORAL_TABLET | Freq: Every day | ORAL | Status: DC
  Administered 2023-11-20: 250 mg via ORAL
  Filled 2023-11-20: qty 1

## 2023-11-20 MED ORDER — TRAZODONE HCL 50 MG PO TABS
50.0000 mg | ORAL_TABLET | Freq: Every evening | ORAL | Status: DC | PRN
  Administered 2023-11-20 – 2023-11-27 (×8): 50 mg via ORAL
  Filled 2023-11-20 (×8): qty 1

## 2023-11-20 MED ORDER — LEVETIRACETAM 500 MG PO TABS
1000.0000 mg | ORAL_TABLET | Freq: Two times a day (BID) | ORAL | Status: DC
  Administered 2023-11-21 – 2023-11-28 (×15): 1000 mg via ORAL
  Filled 2023-11-20 (×18): qty 2

## 2023-11-20 NOTE — ED Notes (Signed)
 Pt ambulated to the restroom to provide sample.
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
## 2023-11-20 NOTE — ED Notes (Signed)
 Patient transported to CT

## 2023-11-20 NOTE — ED Notes (Signed)
 Pt is resting comfortably, will administer meds upon awakening
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
## 2023-11-20 NOTE — ED Notes (Signed)
 Pt informs this RN that she "feels a seizure coming on" - Seizure pads applied on the bed and EDP notified.

## 2023-11-20 NOTE — Discharge Instructions (Addendum)

## 2023-11-20 NOTE — Tx Team (Signed)
 Initial Treatment Plan 11/20/2023 11:45 PM Madeline Shaw OAC:[id number]    PATIENT STRESSORS: Medication change or noncompliance   Occupational concerns     PATIENT STRENGTHS: Ability for insight  Motivation for treatment/growth    PATIENT IDENTIFIED PROBLEMS: Depression  Anxiety  SI                 DISCHARGE CRITERIA:  Ability to meet basic life and health needs Motivation to continue treatment in a less acute level of care  PRELIMINARY DISCHARGE PLAN: Outpatient therapy Placement in alternative living arrangements  PATIENT/FAMILY INVOLVEMENT: This treatment plan has been presented to and reviewed with the patient, Madeline Shaw.  The patient has been given the opportunity to ask questions and make suggestions.  [name], RN 11/20/2023, 11:45 PM
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
## 2023-11-20 NOTE — Plan of Care (Signed)
 Patient new to the unit tonight, hasn't had time to progress  Problem: Education: Goal: Knowledge of [place] [facility] Education information/materials will improve Outcome: Not Progressing Goal: Emotional status will improve Outcome: Not Progressing Goal: Mental status will improve Outcome: Not Progressing Goal: Verbalization of understanding the information provided will improve Outcome: Not Progressing   Problem: Activity: Goal: Interest or engagement in activities will improve Outcome: Not Progressing Goal: Sleeping patterns will improve Outcome: Not Progressing   Problem: Coping: Goal: Ability to verbalize frustrations and anger appropriately will improve Outcome: Not Progressing Goal: Ability to demonstrate self-control will improve Outcome: Not Progressing   Problem: Health Behavior/Discharge Planning: Goal: Identification of resources available to assist in meeting health care needs will improve Outcome: Not Progressing Goal: Compliance with treatment plan for underlying cause of condition will improve Outcome: Not Progressing   Problem: Physical Regulation: Goal: Ability to maintain clinical measurements within normal limits will improve Outcome: Not Progressing   Problem: Safety: Goal: Periods of time without injury will increase Outcome: Not Progressing
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
## 2023-11-20 NOTE — ED Triage Notes (Signed)
 Pt BIBA from [facility], c/o for possible "seizure activity". Pt was seen at [facility] today for same but was escorted out.

## 2023-11-20 NOTE — Progress Notes (Signed)
 Pt was accepted to [facility]  11/20/2023 Bed Assignment 302  Address: [address] [place], [place], [place] [address]  BMU FAX Number [phone]  Pt meets inpatient criteria per: Alona Bene, PMHNP   Attending Physician will be Dr. Callie Fielding  Report can be called to: [phone]  Pt can arrive after 730 PM tonight   Care Team notified:  Rona Ravens RN, Alona Bene, PMHNP, Jacquelynn Cree NT, Charlsie Merles RN, Arnette Felts RN    [place] Fines Kimberlin, MSW, [facility] 11/20/2023 12:39 PM
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
## 2023-11-20 NOTE — Progress Notes (Signed)
 Pt admitted from [facility], ED, report received from [place], [place]. Pt calm and pleasant during assessment denying SI/HI/AVH. Pt endorses anxiety and depression. Pt stated she has weakness in her legs from a recent fall and can't use a walker and needs a wheelchair. Pt given education, support, and encouragement to be active in her treatment plan. Pt compliant with medication administration per MD orders. Pt being monitored Q 15 minutes for safety per unit protocol, remains safe on the unit

## 2023-11-21 DIAGNOSIS — F319 Bipolar disorder, unspecified: Secondary | ICD-10-CM | POA: Diagnosis not present

## 2023-11-21 DIAGNOSIS — Z87898 Personal history of other specified conditions: Secondary | ICD-10-CM

## 2023-11-21 DIAGNOSIS — F1310 Sedative, hypnotic or anxiolytic abuse, uncomplicated: Secondary | ICD-10-CM | POA: Insufficient documentation

## 2023-11-21 LAB — LIPID PANEL
Cholesterol: 156 mg/dL (ref 0–200)
HDL: 33 mg/dL — ABNORMAL LOW (ref >40)
LDL Cholesterol: 95 mg/dL (ref 0–99)
Total CHOL/HDL Ratio: 4.7 ratio
Triglycerides: 139 mg/dL (ref <150)
VLDL: 28 mg/dL (ref 0–40)

## 2023-11-21 LAB — VITAMIN B12: Vitamin B-12: 202 pg/mL (ref 180–914)

## 2023-11-21 LAB — URINE CULTURE: Culture: NO GROWTH

## 2023-11-21 LAB — HEMOGLOBIN A1C
Hgb A1c MFr Bld: 4.9 % (ref 4.8–5.6)
Mean Plasma Glucose: 93.93 mg/dL

## 2023-11-21 MED ORDER — LORAZEPAM 2 MG/ML IJ SOLN: 1.0000 mg | INTRAMUSCULAR | Status: AC | PRN

## 2023-11-21 MED ORDER — LORAZEPAM 2 MG PO TABS
0.0000 mg | ORAL_TABLET | Freq: Two times a day (BID) | ORAL | Status: AC
  Administered 2023-11-23 – 2023-11-24 (×2): 1 mg via ORAL
  Filled 2023-11-21 (×2): qty 1

## 2023-11-21 MED ORDER — LORAZEPAM 1 MG PO TABS: 1.0000 mg | ORAL_TABLET | ORAL | Status: AC | PRN

## 2023-11-21 MED ORDER — FOLIC ACID 1 MG PO TABS
1.0000 mg | ORAL_TABLET | Freq: Every day | ORAL | Status: DC
  Administered 2023-11-21 – 2023-11-28 (×8): 1 mg via ORAL
  Filled 2023-11-21 (×8): qty 1

## 2023-11-21 MED ORDER — ADULT MULTIVITAMIN W/MINERALS CH
1.0000 | ORAL_TABLET | Freq: Every day | ORAL | Status: DC
  Administered 2023-11-21 – 2023-11-28 (×8): 1 via ORAL
  Filled 2023-11-21 (×8): qty 1

## 2023-11-21 MED ORDER — THIAMINE MONONITRATE 100 MG PO TABS
100.0000 mg | ORAL_TABLET | Freq: Every day | ORAL | Status: DC
  Administered 2023-11-21 – 2023-11-28 (×8): 100 mg via ORAL
  Filled 2023-11-21 (×8): qty 1

## 2023-11-21 MED ORDER — LORAZEPAM 2 MG PO TABS
0.0000 mg | ORAL_TABLET | Freq: Four times a day (QID) | ORAL | Status: AC
  Administered 2023-11-21 (×2): 1 mg via ORAL
  Administered 2023-11-22: 2 mg via ORAL
  Filled 2023-11-21 (×3): qty 1

## 2023-11-21 NOTE — Progress Notes (Signed)
   11/21/23 1300  Psych Admission Type (Psych Patients Only)  Admission Status Voluntary  Psychosocial Assessment  Patient Complaints Anxiety;Isolation  Eye Contact Brief  Facial Expression Anxious  Affect Anxious  Speech Logical/coherent  Interaction Assertive  Motor Activity Unsteady (given walker)  Appearance/Hygiene In scrubs  Behavior Characteristics Cooperative  Mood Anxious  Thought Process  Coherency WDL  Content WDL  Delusions None reported or observed  Perception WDL  Hallucination None reported or observed  Judgment Impaired  Confusion None  Danger to Self  Current suicidal ideation? Denies  Agreement Not to Harm Self Yes  Description of Agreement verbal  Danger to Others  Danger to Others None reported or observed

## 2023-11-21 NOTE — Group Note (Signed)
 Date:  11/21/2023 Time:  8:41 PM  Group Topic/Focus:  Recovery Goals:   The focus of this group is to identify appropriate goals for recovery and establish a plan to achieve them. Wrap-Up Group:   The focus of this group is to help patients review their daily goal of treatment and discuss progress on daily workbooks.    Participation Level:  Did Not Attend   Madeline Shaw 11/21/2023, 8:41 PM
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
## 2023-11-21 NOTE — Plan of Care (Signed)
   Problem: Education: Goal: Emotional status will improve Outcome: Progressing Goal: Mental status will improve Outcome: Progressing
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
## 2023-11-21 NOTE — Group Note (Signed)
 Recreation Therapy Group Note   Group Topic:Leisure Education  Group Date: 11/21/2023 Start Time: 1300 End Time: 1400 Facilitators: Rosina Lowenstein, LRT, CTRS Location:  Craft Room  Group Description: Leisure. Patients were given the option to choose from singing karaoke, coloring mandalas, using oil pastels, journaling, or playing with play-doh. LRT and pts discussed the meaning of leisure, the importance of participating in leisure during their free time/when they're outside of the hospital, as well as how our leisure interests can also serve as coping skills.   Goal Area(s) Addressed:  Patient will identify a current leisure interest.  Patient will learn the definition of "leisure". Patient will practice making a positive decision. Patient will have the opportunity to try a new leisure activity. Patient will communicate with peers and LRT.    Affect/Mood: N/A   Participation Level: Did not attend    Clinical Observations/Individualized Feedback: Patient did not attend group.   Plan: Continue to engage patient in RT group sessions 2-3x/week.   Rosina Lowenstein, LRT, CTRS 11/21/2023 2:55 PM
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
## 2023-11-21 NOTE — BH IP Treatment Plan (Signed)
 Interdisciplinary Treatment and Diagnostic Plan Update  11/21/2023 Time of Session: 10:36AM Madeline Shaw MRN: [id number]  Principal Diagnosis: Bipolar 1 disorder (HCC)  Secondary Diagnoses: Principal Problem:   Bipolar 1 disorder (HCC)   Current Medications:  Current Facility-Administered Medications  Medication Dose Route Frequency Provider Last Rate Last Admin   alum & mag hydroxide-simeth (MAALOX/MYLANTA) 200-200-20 MG/5ML suspension 30 mL  30 mL Oral Q4H PRN Motley-Mangrum, Jadeka A, PMHNP       fluvoxaMINE (LUVOX) tablet 150 mg  150 mg Oral QHS Motley-Mangrum, Jadeka A, PMHNP       folic acid (FOLVITE) tablet 1 mg  1 mg Oral Daily Myriam Forehand, NP   1 mg at 11/21/23 6644   hydrOXYzine (ATARAX) tablet 100 mg  100 mg Oral TID PRN Motley-Mangrum, Jadeka A, PMHNP   100 mg at 11/20/23 2350   lamoTRIgine (LAMICTAL XR) 24 hour tablet 250 mg  250 mg Oral Daily Motley-Mangrum, Jadeka A, PMHNP   250 mg at 11/21/23 0956   levETIRAcetam (KEPPRA) tablet 1,000 mg  1,000 mg Oral BID Motley-Mangrum, Jadeka A, PMHNP   1,000 mg at 11/21/23 0956   LORazepam (ATIVAN) tablet 1-4 mg  1-4 mg Oral Q1H PRN Myriam Forehand, NP       Or   LORazepam (ATIVAN) injection 1-4 mg  1-4 mg Intravenous Q1H PRN Myriam Forehand, NP       LORazepam (ATIVAN) tablet 0-4 mg  0-4 mg Oral Q6H Myriam Forehand, NP   1 mg at 11/21/23 0957   Followed by   Melene Muller ON 11/23/2023] LORazepam (ATIVAN) tablet 0-4 mg  0-4 mg Oral Q12H Myriam Forehand, NP       magnesium hydroxide (MILK OF MAGNESIA) suspension 30 mL  30 mL Oral Daily PRN Motley-Mangrum, Jadeka A, PMHNP       mirtazapine (REMERON) tablet 15 mg  15 mg Oral QHS PRN Motley-Mangrum, Jadeka A, PMHNP       multivitamin with minerals tablet 1 tablet  1 tablet Oral Daily Myriam Forehand, NP   1 tablet at 11/21/23 0958   OLANZapine (ZYPREXA) injection 10 mg  10 mg Intramuscular TID PRN Motley-Mangrum, Jadeka A, PMHNP       OLANZapine (ZYPREXA) injection 5 mg  5 mg Intramuscular TID  PRN Motley-Mangrum, Jadeka A, PMHNP       OLANZapine zydis (ZYPREXA) disintegrating tablet 5 mg  5 mg Oral TID PRN Motley-Mangrum, Jadeka A, PMHNP       prazosin (MINIPRESS) capsule 2 mg  2 mg Oral QHS Motley-Mangrum, Jadeka A, PMHNP       thiamine (VITAMIN B1) tablet 100 mg  100 mg Oral Daily Myriam Forehand, NP   100 mg at 11/21/23 0957   traZODone (DESYREL) tablet 50 mg  50 mg Oral QHS PRN Motley-Mangrum, Jadeka A, PMHNP   50 mg at 11/20/23 2350   PTA Medications: Medications Prior to Admission  Medication Sig Dispense Refill Last Dose/Taking   cephALEXin (KEFLEX) 500 MG capsule Take 500 mg by mouth 2 (two) times daily. (Patient not taking: Reported on 11/20/2023)      hydrOXYzine (VISTARIL) 100 MG capsule Take 100 mg by mouth 3 (three) times daily as needed for anxiety.      LamoTRIgine (LAMICTAL XR) 250 MG TB24 24 hour tablet Take 500 mg by mouth daily.      levETIRAcetam (KEPPRA) 1000 MG tablet Take 1,000 mg by mouth 2 (two) times daily.      lurasidone (LATUDA)  20 MG TABS tablet Take 20 mg by mouth daily. (Patient not taking: Reported on 11/20/2023)      rizatriptan (MAXALT) 10 MG tablet Take 10 mg by mouth as needed for migraine.      VENTOLIN HFA 108 (90 Base) MCG/ACT inhaler Inhale 1 puff into the lungs every 6 (six) hours as needed for wheezing or shortness of breath.       Patient Stressors: Medication change or noncompliance   Occupational concerns    Patient Strengths: Ability for insight  Motivation for treatment/growth   Treatment Modalities: Medication Management, Group therapy, Case management,  1 to 1 session with clinician, Psychoeducation, Recreational therapy.   Physician Treatment Plan for Primary Diagnosis: Bipolar 1 disorder (HCC) Long Term Goal(s):     Short Term Goals:    Medication Management: Evaluate patient's response, side effects, and tolerance of medication regimen.  Therapeutic Interventions: 1 to 1 sessions, Unit Group sessions and Medication  administration.  Evaluation of Outcomes: Not Met  Physician Treatment Plan for Secondary Diagnosis: Principal Problem:   Bipolar 1 disorder (HCC)  Long Term Goal(s):     Short Term Goals:       Medication Management: Evaluate patient's response, side effects, and tolerance of medication regimen.  Therapeutic Interventions: 1 to 1 sessions, Unit Group sessions and Medication administration.  Evaluation of Outcomes: Not Met   RN Treatment Plan for Primary Diagnosis: Bipolar 1 disorder (HCC) Long Term Goal(s): Knowledge of disease and therapeutic regimen to maintain health will improve  Short Term Goals: Ability to verbalize frustration and anger appropriately will improve, Ability to demonstrate self-control, Ability to participate in decision making will improve, Ability to verbalize feelings will improve, Ability to disclose and discuss suicidal ideas, Ability to identify and develop effective coping behaviors will improve, and Compliance with prescribed medications will improve  Medication Management: RN will administer medications as ordered by provider, will assess and evaluate patient's response and provide education to patient for prescribed medication. RN will report any adverse and/or side effects to prescribing provider.  Therapeutic Interventions: 1 on 1 counseling sessions, Psychoeducation, Medication administration, Evaluate responses to treatment, Monitor vital signs and CBGs as ordered, Perform/monitor CIWA, COWS, AIMS and Fall Risk screenings as ordered, Perform wound care treatments as ordered.  Evaluation of Outcomes: Not Met   LCSW Treatment Plan for Primary Diagnosis: Bipolar 1 disorder (HCC) Long Term Goal(s): Safe transition to appropriate next level of care at discharge, Engage patient in therapeutic group addressing interpersonal concerns.  Short Term Goals: Engage patient in aftercare planning with referrals and resources, Increase social support, Increase  ability to appropriately verbalize feelings, Increase emotional regulation, Facilitate acceptance of mental health diagnosis and concerns, and Increase skills for wellness and recovery  Therapeutic Interventions: Assess for all discharge needs, 1 to 1 time with Social worker, Explore available resources and support systems, Assess for adequacy in community support network, Educate family and significant other(s) on suicide prevention, Complete Psychosocial Assessment, Interpersonal group therapy.  Evaluation of Outcomes: Not Met   Progress in Treatment: Attending groups: No. Participating in groups: No. Taking medication as prescribed: Yes. Toleration medication: Yes. Family/Significant other contact made: No, will contact:  once permission has been granted Patient understands diagnosis: Yes. Discussing patient identified problems/goals with staff: Yes. Medical problems stabilized or resolved: Yes. Denies suicidal/homicidal ideation: Yes. Issues/concerns per patient self-inventory: No. Other: none  New problem(s) identified: No, Describe:  none  New Short Term/Long Term Goal(s): detox, elimination of symptoms of psychosis, medication management for  mood stabilization; elimination of SI thoughts; development of comprehensive mental wellness/sobriety plan.   Patient Goals:  "I just want to find me" "get back to myself"  Discharge Plan or Barriers: CSW to assist in the development of appropriate discharge plans.  Reason for Continuation of Hospitalization: Anxiety Depression Medication stabilization Suicidal ideation  Estimated Length of Stay:  1-7 days  Last 3 [place] Suicide Severity Risk Score: Flowsheet Row Admission (Current) from 11/20/2023 in [facility] INPATIENT BE[facility]INE Most recent reading at 11/20/2023 11:04 PM ED from 11/20/2023 in [facility] Emergency Department at [facility] Most recent reading at 11/20/2023  1:59 AM ED from 11/20/2023 in [facility] Most recent reading at 11/20/2023  1:24 AM  C-SSRS RISK CATEGORY Low Risk No Risk Low Risk       Last PHQ 2/9 Scores:     No data to display          Scribe for Treatment Team: Harden Mo, Alexander Mt 11/21/2023 1:42 PM

## 2023-11-21 NOTE — Group Note (Signed)
 Date:  11/21/2023 Time:  10:28 AM  Group Topic/Focus:  Spirituality:   The focus of this group is to discuss how one's spirituality can aide in recovery.    Participation Level:  Did Not Attend   Madeline Shaw 11/21/2023, 10:28 AM
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
## 2023-11-22 DIAGNOSIS — Z765 Malingerer [conscious simulation]: Secondary | ICD-10-CM

## 2023-11-22 DIAGNOSIS — F319 Bipolar disorder, unspecified: Secondary | ICD-10-CM | POA: Diagnosis not present

## 2023-11-22 LAB — LIPID PANEL
Cholesterol: 160 mg/dL (ref 0–200)
HDL: 34 mg/dL — ABNORMAL LOW (ref >40)
LDL Cholesterol: 96 mg/dL (ref 0–99)
Total CHOL/HDL Ratio: 4.7 ratio
Triglycerides: 151 mg/dL — ABNORMAL HIGH (ref <150)
VLDL: 30 mg/dL (ref 0–40)

## 2023-11-22 LAB — HEMOGLOBIN A1C
Hgb A1c MFr Bld: 4.9 % (ref 4.8–5.6)
Mean Plasma Glucose: 93.93 mg/dL

## 2023-11-22 LAB — VITAMIN B12: Vitamin B-12: 245 pg/mL (ref 180–914)

## 2023-11-22 LAB — LAMOTRIGINE LEVEL: Lamotrigine Lvl: 17.9 ug/mL (ref 2.0–20.0)

## 2023-11-22 LAB — LEVETIRACETAM LEVEL: Levetiracetam Lvl: 28.8 ug/mL (ref 10.0–40.0)

## 2023-11-22 MED ORDER — NICOTINE POLACRILEX 2 MG MT GUM: 2.0000 mg | CHEWING_GUM | OROMUCOSAL | Status: DC | PRN

## 2023-11-22 MED ORDER — IBUPROFEN 200 MG PO TABS
400.0000 mg | ORAL_TABLET | Freq: Three times a day (TID) | ORAL | Status: DC | PRN
  Administered 2023-11-24 – 2023-11-28 (×4): 400 mg via ORAL
  Filled 2023-11-22 (×4): qty 2

## 2023-11-22 MED ORDER — NICOTINE 7 MG/24HR TD PT24
7.0000 mg | MEDICATED_PATCH | Freq: Every day | TRANSDERMAL | Status: DC
  Administered 2023-11-23 – 2023-11-28 (×6): 7 mg via TRANSDERMAL
  Filled 2023-11-22 (×7): qty 1

## 2023-11-22 MED ORDER — TRAMADOL HCL 50 MG PO TABS
50.0000 mg | ORAL_TABLET | Freq: Two times a day (BID) | ORAL | Status: AC | PRN
  Administered 2023-11-22: 50 mg via ORAL
  Filled 2023-11-22: qty 1

## 2023-11-22 NOTE — Progress Notes (Signed)
   11/22/23 1858  Psych Admission Type (Psych Patients Only)  Admission Status Voluntary  Psychosocial Assessment  Patient Complaints Anxiety;Depression (patient states that she always has depression and anxiety.)  Eye Contact Fair;Watchful  Facial Expression Anxious;Pained  Affect Anxious;Preoccupied  Speech Logical/coherent  Interaction Needy;Cautious  Motor Activity Fidgety;Shuffling;Slow;Unsteady  Appearance/Hygiene In scrubs;Disheveled;Poor Media planner Cooperative;Appropriate to situation  Mood Anxious;Preoccupied;Pleasant  Aggressive Behavior  Effect No apparent injury  Thought Process  Coherency WDL  Content WDL  Delusions None reported or observed  Perception WDL  Hallucination None reported or observed  Judgment WDL  Confusion None  Danger to Self  Current suicidal ideation? Denies  Agreement Not to Harm Self Yes  Description of Agreement Verbal  Danger to Others  Danger to Others None reported or observed

## 2023-11-22 NOTE — Group Note (Signed)
 Date:  11/22/2023 Time:  8:43 PM  Group Topic/Focus:  Orientation:   The focus of this group is to educate the patient on the purpose and policies of crisis stabilization and provide a format to answer questions about their admission.  The group details unit policies and expectations of patients while admitted. Wrap-Up Group:   The focus of this group is to help patients review their daily goal of treatment and discuss progress on daily workbooks.    Participation Level:  Did Not Attend   Madeline Shaw 11/22/2023, 8:43 PM

## 2023-11-22 NOTE — Evaluation (Signed)
 Physical Therapy Evaluation Patient Details Name: Madeline Shaw MRN: [id number] DOB: June 10, 1976 Today's Date: 11/22/2023  History of Present Illness  48 y/o female with a complex medical and psychiatric history, including bipolar disorder, major depressive disorder, generalized anxiety disorder, PTSD, epilepsy, stroke, vertigo, and benzodiazepine use disorder, who presented voluntarily via [employer] ([facility]). She arrived in a wheelchair, appearing frail and visibly distressed, with a black eye, staples to her frontal scalp and chin, and multiple bruises,  Clinical Impression  Pt pleasant and willing to work with PT, but struggled with basically all aspects of mobility/PT session.  She could inconsistently show more fluid and confident movement but generally struggled to organize transitional/mobility movements, follow cues consistently and ultimately showed poor safety and general awareness with mobility tasks.  She was able to ambulate into the hallway and back to her room but >50% of the time struggled to perform step-through cadence with almost Parkinsonian looking shuffling.  Intermittently able to step-through but quick to revert w/o constant cuing and reminders to slow and think about deliberate longer step length.  Pt did not have dizziness or vertigo during session but reports h/o vestibular PT and encouraged/reviewed VOR exercises that she vaguely remembered from prior vestibular PT work.       If plan is discharge home, recommend the following: A little help with walking and/or transfers;A little help with bathing/dressing/bathroom;Assistance with cooking/housework;Assist for transportation;Help with stairs or ramp for entrance   Can travel by private vehicle        Equipment Recommendations  Front wheeled walker, pt may have one at home - will need to clarify  Recommendations for Other Services       Functional Status Assessment Patient has had a recent decline  in their functional status and demonstrates the ability to make significant improvements in function in a reasonable and predictable amount of time.     Precautions / Restrictions Precautions Precautions: Fall Restrictions Weight Bearing Restrictions Per Provider Order: No      Mobility  Bed Mobility Overal bed mobility: Needs Assistance Bed Mobility: Supine to Sit, Sit to Supine     Supine to sit: Min assist Sit to supine: Min assist   General bed mobility comments: Pt largely able to complete transistions to/from EOB on her own, but did need excess cuing (tactile, verbal, demo) and encouragement as she struggled to organize her movements/stay on task and generally displayed unsteady/impulsive/incongruous movements.    Transfers Overall transfer level: Needs assistance Equipment used: Rolling walker (2 wheels) Transfers: Sit to/from Stand Sit to Stand: Min assist           General transfer comment: Pt able to rise from relatively low BHU bed, but was leaning heavily with back of legs to leverage.  Reliant on the walker, light assist from PT to actually get to fully upright and encourage weight shift into the walker    Ambulation/Gait Ambulation/Gait assistance: Min assist Gait Distance (Feet): 40 Feet Assistive device: Rolling walker (2 wheels)         General Gait Details: Pt with almost Parkinsonian shuffling and poor AD awareness.  With consistent cuing she was able to intermittently show more controlled/longer step-through cadence but quickly reverts to choppy/shuffling steppage and poor AD use/positioning again requiring consistent cuing and engouragement  Stairs            Wheelchair Mobility     Tilt Bed    Modified Rankin (Stroke Patients Only)       Balance  Overall balance assessment: Modified Independent                                           Pertinent Vitals/Pain Pain Assessment Pain Assessment:  (general soreness,  no focal pain)    Home Living Family/patient expects to be discharged to:: Shelter/Homeless Living Arrangements: Non-relatives/Friends                 Additional Comments: apparently has been staying with friends, conflicting stories of having an apartment?    Prior Function Prior Level of Function : Independent/Modified Independent             Mobility Comments: Pt reports she has a walker (but rarely uses), reports "18" falls in the last 6 months? ADLs Comments: Reprorts she does not need assist with ADLs     Extremity/Trunk Assessment   Upper Extremity Assessment Upper Extremity Assessment: Generalized weakness;Overall WFL for tasks assessed    Lower Extremity Assessment Lower Extremity Assessment: Generalized weakness (displays poor strength/tolerance with LE movement)       Communication   Communication Communication: No apparent difficulties    Cognition Arousal: Alert Behavior During Therapy: Restless, Impulsive   PT - Cognitive impairments:  (appropriate with long standing psych history)                         Following commands: Intact       Cueing Cueing Techniques: Verbal cues, Gestural cues     General Comments General comments (skin integrity, edema, etc.): Pt pleasant and willing to work with PT but impulsive with limited follow through regarding cues    Exercises     Assessment/Plan    PT Assessment Patient needs continued PT services  PT Problem List Decreased strength;Decreased activity tolerance;Decreased balance;Decreased mobility;Decreased cognition;Decreased knowledge of use of DME;Decreased safety awareness       PT Treatment Interventions DME instruction;Gait training;Functional mobility training;Therapeutic activities;Therapeutic exercise;Balance training;Neuromuscular re-education;Cognitive remediation;Patient/family education    PT Goals (Current goals can be found in the Care Plan section)  Acute Rehab PT  Goals Patient Stated Goal: walk better PT Goal Formulation: With patient Time For Goal Achievement: 12/05/23 Potential to Achieve Goals: Fair    Frequency Min 1X/week     Co-evaluation               AM-PAC PT "6 Clicks" Mobility  Outcome Measure Help needed turning from your back to your side while in a flat bed without using bedrails?: None Help needed moving from lying on your back to sitting on the side of a flat bed without using bedrails?: None Help needed moving to and from a bed to a chair (including a wheelchair)?: A Lot Help needed standing up from a chair using your arms (e.g., wheelchair or bedside chair)?: A Little Help needed to walk in hospital room?: A Lot Help needed climbing 3-5 steps with a railing? : Total 6 Click Score: 16    End of Session Equipment Utilized During Treatment: Gait belt Activity Tolerance: Patient limited by fatigue Patient left: in bed;with nursing/sitter in room Nurse Communication: Mobility status PT Visit Diagnosis: Muscle weakness (generalized) (M62.81);Difficulty in walking, not elsewhere classified (R26.2);Unsteadiness on feet (R26.81);History of falling (Z91.81)    Time: 8119-1478 PT Time Calculation (min) (ACUTE ONLY): 19 min   Charges:   PT Evaluation $PT Eval Low Complexity: 1  Low PT Treatments $Gait Training: 8-22 mins PT General Charges $$ ACUTE PT VISIT: 1 Visit         Malachi Pro, DPT 11/22/2023, 12:57 PM

## 2023-11-22 NOTE — Plan of Care (Signed)

## 2023-11-22 NOTE — BHH Counselor (Signed)
 CSW contacted friend [name] on patients behalf, Seward Grater refused to take responsibility for patients safety. Seward Grater states that she has helped her for a long time and can not continue to be responsible for her. Seward Grater states that the  patient does not have a place to stay, or transportation but is eligible for medicaid transportation but patient has refused in the past to use it. Seward Grater states that patient has not used any substances in 10 years.

## 2023-11-22 NOTE — Plan of Care (Signed)
°  Problem: Education: Goal: Emotional status will improve Outcome: Progressing   Problem: Education: Goal: Mental status will improve Outcome: Progressing   Problem: Education: Goal: Knowledge of [facility] Education information/materials will improve Outcome: Progressing
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
## 2023-11-23 DIAGNOSIS — F319 Bipolar disorder, unspecified: Secondary | ICD-10-CM

## 2023-11-23 MED ORDER — TRIPLE ANTIBIOTIC 3.5-400-5000 EX OINT: 1.0000 | TOPICAL_OINTMENT | Freq: Every day | CUTANEOUS | Status: AC | PRN

## 2023-11-23 MED ORDER — ONDANSETRON 4 MG PO TBDP
4.0000 mg | ORAL_TABLET | Freq: Three times a day (TID) | ORAL | Status: DC | PRN
  Administered 2023-11-23 – 2023-11-27 (×3): 4 mg via ORAL
  Filled 2023-11-23 (×3): qty 1

## 2023-11-23 NOTE — Group Note (Signed)
 Date:  11/23/2023 Time:  7:08 PM  Group Topic/Focus:  Goals Group:   The focus of this group is to help patients establish daily goals to achieve during treatment and discuss how the patient can incorporate goal setting into their daily lives to aide in recovery.    Participation Level:  Did Not Attend   Madeline Shaw 11/23/2023, 7:08 PM

## 2023-11-23 NOTE — Plan of Care (Signed)
   Problem: Education: Goal: Knowledge of [facility] Education information/materials will improve Outcome: Progressing Goal: Emotional status will improve Outcome: Progressing Goal: Mental status will improve Outcome: Progressing

## 2023-11-23 NOTE — Group Note (Signed)
 Date:  11/23/2023 Time:  7:15 PM  Group Topic/Focus:  Activity Group: The focus of the group is to promote activity for the patients and encourage them to go outside to the courtyard and get some fresh air and some exercise.    Participation Level:  Did Not Attend   Madeline Shaw Madeline Shaw 11/23/2023, 7:15 PM
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
## 2023-11-23 NOTE — Progress Notes (Signed)
   11/23/23 1003  Psych Admission Type (Psych Patients Only)  Admission Status Voluntary  Psychosocial Assessment  Patient Complaints Anxiety  Eye Contact Fair  Facial Expression Anxious  Affect Anxious  Speech Logical/coherent  Interaction Cautious  Motor Activity Fidgety  Appearance/Hygiene Poor hygiene  Behavior Characteristics Anxious  Mood Anxious  Thought Process  Coherency WDL  Content WDL  Delusions None reported or observed  Perception WDL  Hallucination None reported or observed  Judgment WDL  Confusion None  Danger to Self  Current suicidal ideation? Denies  Agreement Not to Harm Self Yes  Description of Agreement Verbal  Danger to Others  Danger to Others None reported or observed

## 2023-11-23 NOTE — Group Note (Signed)
 Date:  11/23/2023 Time:  9:50 PM  Group Topic/Focus:  Wrap-Up Group:   The focus of this group is to help patients review their daily goal of treatment and discuss progress on daily workbooks.    Participation Level:  Did Not Attend   Katina Dung 11/23/2023, 9:50 PM

## 2023-11-24 DIAGNOSIS — F319 Bipolar disorder, unspecified: Secondary | ICD-10-CM | POA: Diagnosis not present

## 2023-11-24 LAB — LEVETIRACETAM LEVEL: Levetiracetam Lvl: 28.6 ug/mL (ref 10.0–40.0)

## 2023-11-24 LAB — LAMOTRIGINE LEVEL: Lamotrigine Lvl: 15.6 ug/mL (ref 2.0–20.0)

## 2023-11-24 MED ORDER — ENSURE ENLIVE PO LIQD
237.0000 mL | Freq: Two times a day (BID) | ORAL | Status: DC
  Administered 2023-11-25 – 2023-11-27 (×2): 237 mL via ORAL

## 2023-11-24 MED ORDER — VITAMIN B-12 1000 MCG PO TABS
1000.0000 ug | ORAL_TABLET | Freq: Every day | ORAL | Status: DC
  Administered 2023-11-25 – 2023-11-28 (×4): 1000 ug via ORAL
  Filled 2023-11-24 (×5): qty 1

## 2023-11-24 NOTE — Progress Notes (Signed)
   11/24/23 1200  Psych Admission Type (Psych Patients Only)  Admission Status Voluntary  Psychosocial Assessment  Patient Complaints Anxiety  Eye Contact Fair  Facial Expression Other (Comment) (WNL)  Affect Appropriate to circumstance  Speech Logical/coherent  Interaction Cautious  Motor Activity Fidgety  Appearance/Hygiene Unremarkable  Behavior Characteristics Cooperative  Mood Pleasant  Thought Process  Coherency WDL  Content WDL  Delusions None reported or observed  Perception WDL  Hallucination None reported or observed  Judgment Impaired  Confusion None  Danger to Self  Current suicidal ideation? Denies  Danger to Others  Danger to Others None reported or observed   Patient ambulates with walker. ADLs maintained. Support and encouragement given.

## 2023-11-24 NOTE — Group Note (Signed)
 Recreation Therapy Group Note   Group Topic:Coping Skills  Group Date: 11/24/2023 Start Time: 1100 End Time: 1140 Facilitators: Rosina Lowenstein, LRT, CTRS Location:  Craft Room  Group Description: Mind Map.  Patient was provided a blank template of a diagram with 32 blank boxes in a tiered system, branching from the center (similar to a bubble chart). LRT directed patients to label the middle of the diagram "Coping Skills". LRT and patients then came up with 8 different coping skills as examples. Pt were directed to record their coping skills in the 2nd tier boxes closest to the center.  Patients would then share their coping skills with the group as LRT wrote them out. LRT gave a handout of 99 different coping skills at the end of group.   Goal Area(s) Addressed: Patients will be able to define "coping skills". Patient will identify new coping skills.  Patient will increase communication.   Affect/Mood: N/A   Participation Level: Did not attend    Clinical Observations/Individualized Feedback: Patient did not attend group.   Plan: Continue to engage patient in RT group sessions 2-3x/week.   [address], LRT, CTRS 11/24/2023 1:24 PM

## 2023-11-24 NOTE — Group Note (Signed)
 LCSW Group Therapy Note   Group Date: 11/24/2023 Start Time: 1300 End Time: 1400   Type of Therapy and Topic:  Group Therapy: Challenging Core Beliefs  Participation Level:  Did Not Attend  Description of Group:  Patients were educated about core beliefs and asked to identify one harmful core belief that they have. Patients were asked to explore from where those beliefs originate. Patients were asked to discuss how those beliefs make them feel and the resulting behaviors of those beliefs. They were then be asked if those beliefs are true and, if so, what evidence they have to support them. Lastly, group members were challenged to replace those negative core beliefs with helpful beliefs.   Therapeutic Goals:   1. Patient will identify harmful core beliefs and explore the origins of such beliefs. 2. Patient will identify feelings and behaviors that result from those core beliefs. 3. Patient will discuss whether such beliefs are true. 4.  Patient will replace harmful core beliefs with helpful ones.  Summary of Patient Progress:  Patient did not attend.   Therapeutic Modalities: Cognitive Behavioral Therapy; Solution-Focused Therapy   Lowry Ram, LCSWA 11/24/2023  2:09 PM

## 2023-11-24 NOTE — Group Note (Signed)
 Recreation Therapy Group Note   Group Topic:Health and Wellness  Group Date: 11/24/2023 Start Time: 1530 End Time: 1635 Facilitators: Rosina Lowenstein, LRT, CTRS Location: Courtyard  Group Description: [employer]. LRT and patients played games of basketball, drew with chalk, and played corn hole while outside in the courtyard while getting fresh air and sunlight. Music was being played in the background. LRT and peers conversed about different games they have played before, what they do in their free time and anything else that is on their minds. LRT encouraged pts to drink water after being outside, sweating and getting their heart rate up.  Goal Area(s) Addressed: Patient will build on frustration tolerance skills. Patients will partake in a competitive play game with peers. Patients will gain knowledge of new leisure interest/hobby.    Affect/Mood: N/A   Participation Level: Did not attend    Clinical Observations/Individualized Feedback: Patient did not attend group.   Plan: Continue to engage patient in RT group sessions 2-3x/week.   Rosina Lowenstein, LRT, CTRS 11/24/2023 5:26 PM
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
## 2023-11-24 NOTE — Group Note (Signed)
 Date:  11/24/2023 Time:  8:59 PM  Group Topic/Focus:  Wrap-Up Group:   The focus of this group is to help patients review their daily goal of treatment and discuss progress on daily workbooks.    Participation Level:  Active  Participation Quality:  Appropriate, Attentive, Sharing, and Supportive  Affect:  Appropriate  Cognitive:  Appropriate  Insight: Appropriate  Engagement in Group:  Engaged and Supportive  Modes of Intervention:  Discussion and Support  Additional Comments:     Belva Crome 11/24/2023, 8:59 PM

## 2023-11-24 NOTE — Plan of Care (Signed)
  Problem: Education: Goal: Knowledge of [place] [facility] Education information/materials will improve Outcome: Progressing Goal: Emotional status will improve Outcome: Progressing Goal: Mental status will improve Outcome: Progressing  Patient ambulating with front wheel walker stated she feel a bit better this evening she is slowly gaining her strength. Denies SI/HI/A/VH and verbally contracts for safety.

## 2023-11-24 NOTE — Group Note (Signed)
 Date:  11/24/2023 Time:  10:59 AM  Group Topic/Focus:  Goals Group:   The focus of this group is to help patients establish daily goals to achieve during treatment and discuss how the patient can incorporate goal setting into their daily lives to aide in recovery.   Participation Level:  Did Not Attend   Markise Haymer A Tiyona Desouza 11/24/2023, 10:59 AM
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
## 2023-11-24 NOTE — Plan of Care (Signed)
  Problem: Safety: Goal: Periods of time without injury will increase Outcome: Progressing   Problem: Physical Regulation: Goal: Ability to maintain clinical measurements within normal limits will improve Outcome: Progressing   Problem: Health Behavior/Discharge Planning: Goal: Identification of resources available to assist in meeting health care needs will improve Outcome: Progressing   Problem: Education: Goal: Mental status will improve Outcome: Progressing

## 2023-11-25 DIAGNOSIS — Z4802 Encounter for removal of sutures: Secondary | ICD-10-CM

## 2023-11-25 DIAGNOSIS — F319 Bipolar disorder, unspecified: Secondary | ICD-10-CM | POA: Diagnosis not present

## 2023-11-25 MED ORDER — GABAPENTIN 100 MG PO CAPS
100.0000 mg | ORAL_CAPSULE | Freq: Two times a day (BID) | ORAL | Status: DC
  Administered 2023-11-26 – 2023-11-27 (×3): 100 mg via ORAL
  Filled 2023-11-25 (×3): qty 1
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
## 2023-11-25 NOTE — Plan of Care (Signed)
 Patient stated that Zyprexa helped her with anxiety. Patient pleasant and receptive with staff at this time. Support and encouragement given.

## 2023-11-25 NOTE — Plan of Care (Signed)
   Problem: Education: Goal: Emotional status will improve Outcome: Progressing Goal: Mental status will improve Outcome: Progressing   Problem: Activity: Goal: Interest or engagement in activities will improve Outcome: Progressing   Problem: Coping: Goal: Ability to verbalize frustrations and anger appropriately will improve Outcome: Progressing

## 2023-11-25 NOTE — Progress Notes (Signed)
 Patient with complaints of generalized pain. Requesting medications stronger than Ibuprofen. Ambulates with walker. Visible in milieu. Passive SI thoughts, no plan no intent. Socializes appropriately with peers. Encouragement and support provided. Safety checks maintained. Meds given as requested. Pt receptive and remains safe on unit with q 15 min checks.

## 2023-11-25 NOTE — Group Note (Signed)
 Recreation Therapy Group Note   Group Topic:General Recreation  Group Date: 11/25/2023 Start Time: 1430 End Time: 1535 Facilitators: Rosina Lowenstein, LRT, CTRS Location: Courtyard  Group Description: [employer]. LRT and patients played games of basketball, drew with chalk, and played corn hole while outside in the courtyard while getting fresh air and sunlight. Music was being played in the background. LRT and peers conversed about different games they have played before, what they do in their free time and anything else that is on their minds. LRT encouraged pts to drink water after being outside, sweating and getting their heart rate up.  Goal Area(s) Addressed: Patient will build on frustration tolerance skills. Patients will partake in a competitive play game with peers. Patients will gain knowledge of new leisure interest/hobby.    Affect/Mood: N/A   Participation Level: Did not attend    Clinical Observations/Individualized Feedback: Patient did not attend group.   Plan: Continue to engage patient in RT group sessions 2-3x/week.   Rosina Lowenstein, LRT, CTRS 11/25/2023 5:04 PM

## 2023-11-25 NOTE — Group Note (Signed)
 [facility] LCSW Group Therapy Note   Group Date: 11/25/2023 Start Time: 1300 End Time: 1400  Type of Therapy/Topic:  Group Therapy:  Feelings about Diagnosis  Participation Level:  Did Not Attend   Description of Group:    This group will allow patients to explore their thoughts and feelings about diagnoses they have received. Patients will be guided to explore their level of understanding and acceptance of these diagnoses. Facilitator will encourage patients to process their thoughts and feelings about the reactions of others to their diagnosis, and will guide patients in identifying ways to discuss their diagnosis with significant others in their lives. This group will be process-oriented, with patients participating in exploration of their own experiences as well as giving and receiving support and challenge from other group members.   Therapeutic Goals: 1. Patient will demonstrate understanding of diagnosis as evidence by identifying two or more symptoms of the disorder:  2. Patient will be able to express two feelings regarding the diagnosis 3. Patient will demonstrate ability to communicate their needs through discussion and/or role plays  Summary of Patient Progress:    Patient declined to attend group.    Therapeutic Modalities:   Cognitive Behavioral Therapy Brief Therapy Feelings Identification    Harden Mo, LCSW

## 2023-11-25 NOTE — Consult Note (Signed)
 Initial Consultation Note   Patient: Madeline Shaw ZOX:[id number] DOB: 02-06-1976 PCP: [facility] DOA: 11/20/2023 DOS: the patient was seen and examined on 11/25/2023 Primary service: Lewanda Rife, MD  Referring physician: Judeth Cornfield tingling Reason for consult: Suture removal  Assessment/Plan:  Suture removal: TRH consulted for suture removal from left forehead scalp laceration.  Per chart review, laceration occurred on 3/15 at which time 2 staples were placed.  She also had a laceration on her left temporal region with staple repair.  Patient self removed those staples.  The 2 staples on the forehead appeared clean and dry and the laceration is nearly healed with no surrounding erythema.  Sutures removed without difficulty.  Patient tolerated well.  TRH will sign off at present, please call us again when needed.  HPI: Madeline Shaw is a 48 y.o. female with past medical history of Seizure-like activity, bipolar 1 disorder, who is currently admitted to [facility].  TRH consulted for suture removal.  Ms. Mcgough states that the laceration seems to be healing well and she is not having any pain at this time.  She denies any fever, chills.  She endorses some left jaw pain but states this has occurred since the original injury and is gradually improving.  Review of Systems: As mentioned in the history of present illness. All other systems reviewed and are negative.  Past Medical History:  Diagnosis Date   Anxiety    Cancer (HCC)    Depression    Gall stones    Seizures (HCC)    Stroke (HCC)    Vertigo    Past Surgical History:  Procedure Laterality Date   ABDOMINAL HYSTERECTOMY     Social History:  reports that she quit smoking about 27 years ago. Her smoking use included cigarettes. She has never used smokeless tobacco. She reports that she does not currently use alcohol. She reports that she does not currently use drugs after having used the following  drugs: Benzodiazepines.  Allergies  Allergen Reactions   Sumatriptan Anaphylaxis, Rash and Swelling   Acetaminophen Other (See Comments)    Bladder spasms/pressure on bladder. Tolerates Tylenol.   Chlorpheniramine Other (See Comments)    Bladder Spasms (intolerance)  Other Reaction(s): Bladder Spasms (intolerance)    Reaction: Bladder Spasms (intolerance); Bladder spasms     Reaction: Bladder Spasms (intolerance); Bladder spasms   Bladder spasms    Bladder spasms   Topiramate     Unable to take - due to already taking Lamictal /beta-blockers contraindicated due to history of asthma.   Dextromethorphan Hbr     Bladder Spasms (intolerance)   Diphenhydramine Hcl     Bladder Spasms (intolerance)    History reviewed. No pertinent family history.  Prior to Admission medications   Medication Sig Start Date End Date Taking? Authorizing Provider  hydrOXYzine (VISTARIL) 100 MG capsule Take 100 mg by mouth 3 (three) times daily as needed for anxiety. 03/10/23   [provider]  LamoTRIgine (LAMICTAL XR) 250 MG TB24 24 hour tablet Take 500 mg by mouth daily.    [provider]  levETIRAcetam (KEPPRA) 1000 MG tablet Take 1,000 mg by mouth 2 (two) times daily. 11/15/23   [provider]  lurasidone (LATUDA) 20 MG TABS tablet Take 20 mg by mouth daily. Patient not taking: Reported on 11/20/2023 10/27/23   [provider]  rizatriptan (MAXALT) 10 MG tablet Take 10 mg by mouth as needed for migraine. 08/08/23   [provider]  VENTOLIN HFA 108 (90 Base) MCG/ACT inhaler Inhale 1 puff into the lungs every 6 (six) hours as needed for wheezing or shortness of breath. 01/25/22   [provider]    Physical Exam: Vitals:   11/24/23 2110 11/24/23 2112 11/25/23 0631 11/25/23 1720  BP: (!) 92/56 (!) 92/56 107/65 97/62  Pulse: 90  86 88  Resp:   14   Temp:   (!) 97.3 F (36.3 C) (!) 97.5 F (36.4 C)  TempSrc:      SpO2:   98% 100%  Weight:       Height:       Physical Exam Vitals and nursing note reviewed.  Constitutional:      General: She is not in acute distress.    Appearance: She is normal weight.  HENT:     Head: Normocephalic.     Comments: Bruising on the left periorbital region that appears to be healing well.  Bruise on the chin that appears to be healing well. Pulmonary:     Effort: Pulmonary effort is normal.  Skin:    General: Skin is warm and dry.     Comments: Approximately 1 cm healing laceration near midline on the left forehead.  No surrounding erythema.  2 staples in place.  1 cm laceration to the left temporal region with near complete healing.  No staples or sutures noted.  Neurological:     Mental Status: She is alert.    Data Reviewed:  N/A  Family Communication: Primary team note filed via epic chat Primary team communication: No family at bedside Thank you very much for involving [place] in the care of your patient.  Author: Verdene Lennert, MD 11/25/2023 9:00 PM  For on call review [email or web address].
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
## 2023-11-25 NOTE — Progress Notes (Signed)
   11/25/23 1000  Psych Admission Type (Psych Patients Only)  Admission Status Voluntary  Psychosocial Assessment  Patient Complaints Anxiety  Eye Contact Fair  Facial Expression Anxious  Affect Appropriate to circumstance  Speech Logical/coherent  Interaction Cautious  Motor Activity Fidgety  Appearance/Hygiene Unremarkable  Behavior Characteristics Cooperative  Mood Anxious;Pleasant  Thought Process  Coherency WDL  Content WDL  Delusions None reported or observed  Perception WDL  Hallucination None reported or observed  Judgment Impaired  Confusion None  Danger to Self  Current suicidal ideation? Denies  Danger to Others  Danger to Others None reported or observed   Patient stayed in bed and refused to go to groups. Patient states " I hate to be around people and answer their questions." Patient appears little irritable states " I am here to get up and my anxiety is still high." Patient ambulates with walker and ADLs maintained. Support and encouragement given.

## 2023-11-25 NOTE — Group Note (Signed)
 Date:  11/25/2023 Time:  9:04 PM  Group Topic/Focus:  Emotional Education:   The focus of this group is to discuss what feelings/emotions are, and how they are experienced.    Participation Level:  Active  Participation Quality:  Appropriate and Attentive  Affect:  Appropriate  Cognitive:  Alert and Appropriate  Insight: Appropriate and Good  Engagement in Group:  Developing/Improving and Engaged  Modes of Intervention:  Clarification, Discussion, Limit-setting, Rapport Building, Socialization, and Support  Additional Comments:     Madeline Shaw 11/25/2023, 9:04 PM

## 2023-11-26 DIAGNOSIS — F319 Bipolar disorder, unspecified: Secondary | ICD-10-CM | POA: Diagnosis not present

## 2023-11-26 NOTE — Group Note (Signed)
 Date:  11/26/2023 Time:  10:01 AM  Group Topic/Focus:  Dimensions of Wellness:   The focus of this group is to introduce the topic of wellness and discuss the role each dimension of wellness plays in total health.    Participation Level:  Did Not Attend   Mary Sella Destanee Bedonie 11/26/2023, 10:01 AM

## 2023-11-26 NOTE — Progress Notes (Signed)
 Pt calm and pleasant during assessment denying SI/HI/AVH. Pt isolative to her room but did come and get her night time medications. Pt given education, support, and encouragement to be active in her treatment plan. Pt being monitored Q 15 minutes for safety per unit protocol, remains safe on the unit

## 2023-11-26 NOTE — [facility] Counselor (Signed)
 CSW met with the patient to discuss SNF referral.   Pt is declining SNF referral at this time.  Patient was educated on purpose of SNF and still declined.   CSW followed up on if patient would like a walker at discharge as she has used one on the unit.  Patient reports that she WOULD.  She reports that she does not have one.   CSW followed up PT and is awaiting response.    CSW followed up on aftercare plans and pt reports that she would not have a plan until after 5:30 "when everyone is off work".   Penni Homans, MSW, LCSW 11/26/2023 1:23 PM

## 2023-11-26 NOTE — Progress Notes (Signed)
 Physical Therapy Treatment Patient Details Name: Madeline Shaw MRN: [id number] DOB: 1976/03/22 Today's Date: 11/26/2023   History of Present Illness 48 y/o female with a complex medical and psychiatric history, including bipolar disorder, major depressive disorder, generalized anxiety disorder, PTSD, epilepsy, stroke, vertigo, and benzodiazepine use disorder, who presented voluntarily via [employer] ([facility]). She arrived in a wheelchair, appearing frail and visibly distressed, with a black eye, staples to her frontal scalp and chin, and multiple bruises,    PT Comments  Pt recognizes this PT from eval and reports that she has "been doing what you told me" with her walking and was able to display much improve consistency, symmetry and safety with ambulation with the walker.  Pt able to take equal length steps, control walker positioning well (some further cuing today for turns and side stepping) and generally looked much safer, confident and appropriate today.  Trial of ~20 ft with single HHA, no overt LOBs but general unsteadiness with devolution of consistency of gait, continue to recommend rolling walker at discharge.  Continue with POC.     If plan is discharge home, recommend the following: A little help with walking and/or transfers;A little help with bathing/dressing/bathroom;Assistance with cooking/housework;Assist for transportation;Help with stairs or ramp for entrance   Can travel by private vehicle        Equipment Recommendations  Rolling walker (2 wheels)    Recommendations for Other Services       Precautions / Restrictions Precautions Precautions: Fall Restrictions Weight Bearing Restrictions Per Provider Order: No     Mobility  Bed Mobility Overal bed mobility: Independent Bed Mobility: Supine to Sit, Sit to Supine     Supine to sit: Modified independent (Device/Increase time) Sit to supine: Modified independent (Device/Increase time)   General  bed mobility comments: much better quality of movement and ability to self initiate and stay on task.  Easily gets to/from sitting EOB    Transfers Overall transfer level: Modified independent Equipment used: Rolling walker (2 wheels) Transfers: Sit to/from Stand Sit to Stand: Modified independent (Device/Increase time)           General transfer comment: minimal cuing to insure she's inside the walker but able to rise from low bed without assist or unsteadiness    Ambulation/Gait Ambulation/Gait assistance: Supervision, Contact guard assist Gait Distance (Feet): 100 Feet Assistive device: 1 person hand held assist, Rolling walker (2 wheels)         General Gait Details: Pt did not wish to leave room but we did numerous loops in the room with walker and single HHA.  Pt showed greatly improved consistency of cadence (symmetical and appropraite step length, consistent walker momentum, good walker positioning, etc) with none of the Parkinsonian-esque shuffling.  Trial of ~20 ft with single HHA support, no overt LOB but some stagger stepping and inconsistency of cadence - continue to recommend RW.  Pt with many questions all fielded to her satisfaction.  Further cuing for RW use during turns, side stepping and general walker awareness.   Stairs             Wheelchair Mobility     Tilt Bed    Modified Rankin (Stroke Patients Only)       Balance Overall balance assessment: Modified Independent  Communication Communication Communication: No apparent difficulties  Cognition Arousal: Alert Behavior During Therapy: Impulsive                             Following commands: Intact (though at times impulsive)      Cueing Cueing Techniques: Verbal cues, Gestural cues  Exercises      General Comments General comments (skin integrity, edema, etc.): Pt with much improved fluidity and consistency of  gait as compared to PT eval.      Pertinent Vitals/Pain Pain Assessment Pain Assessment: No/denies pain    Home Living                          Prior Function            PT Goals (current goals can now be found in the care plan section) Progress towards PT goals: Progressing toward goals    Frequency    Min 1X/week      PT Plan      Co-evaluation              AM-PAC PT "6 Clicks" Mobility   Outcome Measure  Help needed turning from your back to your side while in a flat bed without using bedrails?: None Help needed moving from lying on your back to sitting on the side of a flat bed without using bedrails?: None Help needed moving to and from a bed to a chair (including a wheelchair)?: None Help needed standing up from a chair using your arms (e.g., wheelchair or bedside chair)?: None Help needed to walk in hospital room?: A Little Help needed climbing 3-5 steps with a railing? : A Little 6 Click Score: 22    End of Session Equipment Utilized During Treatment: Gait belt Activity Tolerance: Patient limited by fatigue Patient left: in bed Nurse Communication: Mobility status PT Visit Diagnosis: Muscle weakness (generalized) (M62.81);Difficulty in walking, not elsewhere classified (R26.2);Unsteadiness on feet (R26.81);History of falling (Z91.81)     Time: 4098-1191 PT Time Calculation (min) (ACUTE ONLY): 10 min  Charges:    $Gait Training: 8-22 mins PT General Charges $$ ACUTE PT VISIT: 1 Visit                     Malachi Pro, DPT 11/26/2023, 5:09 PM

## 2023-11-26 NOTE — Progress Notes (Signed)
   11/26/23 0000  Psych Admission Type (Psych Patients Only)  Admission Status Voluntary  Psychosocial Assessment  Patient Complaints Anxiety  Eye Contact Fair  Facial Expression Anxious  Affect Appropriate to circumstance  Speech Logical/coherent  Interaction Cautious  Motor Activity Fidgety  Appearance/Hygiene Unremarkable  Behavior Characteristics Cooperative  Mood Anxious  Aggressive Behavior  Effect No apparent injury  Thought Process  Coherency WDL  Content WDL  Delusions None reported or observed  Perception WDL  Hallucination None reported or observed  Judgment Impaired  Confusion None  Danger to Self  Current suicidal ideation? Denies  Danger to Others  Danger to Others None reported or observed

## 2023-11-26 NOTE — Group Note (Signed)
 Date:  11/26/2023 Time:  11:19 PM  Group Topic/Focus:  Wrap-Up Group:   The focus of this group is to help patients review their daily goal of treatment and discuss progress on daily workbooks.    Participation Level:  Active  Participation Quality:  Appropriate and Attentive  Affect:  Appropriate  Cognitive:  Alert and Appropriate  Insight: Appropriate and Good  Engagement in Group:  Developing/Improving  Modes of Intervention:  Activity, Rapport Building, and Socialization  Additional Comments:     Jaslynne Dahan 11/26/2023, 11:19 PM

## 2023-11-26 NOTE — Group Note (Signed)
 [facility] LCSW Group Therapy Note   Group Date: 11/26/2023 Start Time: 1310 End Time: 1350   Type of Therapy/Topic:  Group Therapy:  Emotion Regulation  Participation Level:  Did Not Attend    Description of Group:    The purpose of this group is to assist patients in learning to regulate negative emotions and experience positive emotions. Patients will be guided to discuss ways in which they have been vulnerable to their negative emotions. These vulnerabilities will be juxtaposed with experiences of positive emotions or situations, and patients challenged to use positive emotions to combat negative ones. Special emphasis will be placed on coping with negative emotions in conflict situations, and patients will process healthy conflict resolution skills.  Therapeutic Goals: Patient will identify two positive emotions or experiences to reflect on in order to balance out negative emotions:  Patient will label two or more emotions that they find the most difficult to experience:  Patient will be able to demonstrate positive conflict resolution skills through discussion or role plays:   Summary of Patient Progress: Patient did not attend.    Therapeutic Modalities:   Cognitive Behavioral Therapy Feelings Identification Dialectical Behavioral Therapy   Glenis Smoker, LCSW

## 2023-11-26 NOTE — Plan of Care (Signed)
   Problem: Education: Goal: Knowledge of [facility] Education information/materials will improve Outcome: Progressing Goal: Emotional status will improve Outcome: Progressing Goal: Mental status will improve Outcome: Progressing Goal: Verbalization of understanding the information provided will improve Outcome: Progressing
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
## 2023-11-27 DIAGNOSIS — F319 Bipolar disorder, unspecified: Secondary | ICD-10-CM | POA: Diagnosis not present

## 2023-11-27 MED ORDER — GABAPENTIN 100 MG PO CAPS
200.0000 mg | ORAL_CAPSULE | Freq: Three times a day (TID) | ORAL | Status: DC
  Administered 2023-11-27 – 2023-11-28 (×4): 200 mg via ORAL
  Filled 2023-11-27 (×4): qty 2
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
## 2023-11-27 NOTE — Progress Notes (Signed)
 Pt calm and pleasant during assessment denying SI/HI/AVH. Pt more active on the unit tonight going to group. Pt compliant with medication administration per MD orders. Pt given education, support, and encouragement to be active in her treatment plan. Pt being monitored Q 15 minutes for safety per unit protocol, remains safe on the unit

## 2023-11-27 NOTE — Plan of Care (Signed)
   Problem: Education: Goal: Emotional status will improve Outcome: Progressing Goal: Mental status will improve Outcome: Progressing   Problem: Activity: Goal: Sleeping patterns will improve Outcome: Progressing

## 2023-11-27 NOTE — Group Note (Signed)
 LCSW Group Therapy Note   Group Date: 11/27/2023 Start Time: 1300 End Time: 1400   Type of Therapy and Topic:  Group Therapy: Challenging Core Beliefs  Participation Level:  Did Not Attend  Description of Group:  Patients were educated about core beliefs and asked to identify one harmful core belief that they have. Patients were asked to explore from where those beliefs originate. Patients were asked to discuss how those beliefs make them feel and the resulting behaviors of those beliefs. They were then be asked if those beliefs are true and, if so, what evidence they have to support them. Lastly, group members were challenged to replace those negative core beliefs with helpful beliefs.   Therapeutic Goals:   1. Patient will identify harmful core beliefs and explore the origins of such beliefs. 2. Patient will identify feelings and behaviors that result from those core beliefs. 3. Patient will discuss whether such beliefs are true. 4.  Patient will replace harmful core beliefs with helpful ones.  Summary of Patient Progress:  Patient did not attend.   Therapeutic Modalities: Cognitive Behavioral Therapy; Solution-Focused Therapy   Lowry Ram, Theresia Majors 11/27/2023  1:44 PM

## 2023-11-27 NOTE — Group Note (Signed)
 Recreation Therapy Group Note   Group Topic:Goal Setting  Group Date: 11/27/2023 Start Time: 1000 End Time: 1100 Facilitators: Rosina Lowenstein, LRT, CTRS Location:  Craft Room  Group Description: Product/process development scientist. Patients were given many different magazines, a glue stick, markers, and a piece of cardstock paper. LRT and pts discussed the importance of having goals in life. LRT and pts discussed the difference between short-term and long-term goals, as well as what a SMART goal is. LRT encouraged pts to create a vision board, with images they picked and then cut out with safety scissors from the magazine, for themselves, that capture their short and long-term goals. LRT encouraged pts to show and explain their vision board to the group.   Goal Area(s) Addressed:  Patient will gain knowledge of short vs. long term goals.  Patient will identify goals for themselves. Patient will practice setting SMART goals. Patient will verbalize their goals to LRT and peers.   Affect/Mood: N/A   Participation Level: Did not attend    Clinical Observations/Individualized Feedback: Patient did not attend group.   Plan: Continue to engage patient in RT group sessions 2-3x/week.   Rosina Lowenstein, LRT, CTRS 11/27/2023 11:39 AM

## 2023-11-27 NOTE — Progress Notes (Signed)
 Patient is a voluntary admission to [facility] for Bipolar - anxiety.  Patient is cooperative and friendly with staff and peers.  Has been participating and out in the mileiu. Denies SI, HI, AVH, and depression and endorses anxiety.  Given PRN for anxiety.  Will continue to monitor.

## 2023-11-27 NOTE — Group Note (Signed)
 Recreation Therapy Group Note   Group Topic:Health and Wellness  Group Date: 11/27/2023 Start Time: 1530 End Time: 1645 Facilitators: Rosina Lowenstein, LRT, CTRS Location: Courtyard  Group Description: [employer]. LRT and patients played games of basketball, drew with chalk, and played corn hole while outside in the courtyard while getting fresh air and sunlight. Music was being played in the background. LRT and peers conversed about different games they have played before, what they do in their free time and anything else that is on their minds. LRT encouraged pts to drink water after being outside, sweating and getting their heart rate up.  Goal Area(s) Addressed: Patient will build on frustration tolerance skills. Patients will partake in a competitive play game with peers. Patients will gain knowledge of new leisure interest/hobby.    Affect/Mood: N/A   Participation Level: Did not attend    Clinical Observations/Individualized Feedback: Patient did not attend group.   Plan: Continue to engage patient in RT group sessions 2-3x/week.   [address] LRT, CTRS 11/27/2023 5:25 PM

## 2023-11-28 ENCOUNTER — Other Ambulatory Visit: Payer: Self-pay

## 2023-11-28 DIAGNOSIS — F319 Bipolar disorder, unspecified: Secondary | ICD-10-CM | POA: Diagnosis not present

## 2023-11-28 MED ORDER — NICOTINE POLACRILEX 2 MG MT GUM
2.0000 mg | CHEWING_GUM | OROMUCOSAL | 0 refills | Status: DC | PRN
  Filled 2023-11-28: qty 100, 30d supply, fill #0

## 2023-11-28 MED ORDER — NICOTINE 7 MG/24HR TD PT24
7.0000 mg | MEDICATED_PATCH | Freq: Every day | TRANSDERMAL | 0 refills | Status: DC
  Filled 2023-11-28: qty 14, 14d supply, fill #0

## 2023-11-28 MED ORDER — LAMOTRIGINE ER 250 MG PO TB24
250.0000 mg | ORAL_TABLET | Freq: Every day | ORAL | 0 refills | Status: DC
  Filled 2023-11-28: qty 30, 30d supply, fill #0

## 2023-11-28 MED ORDER — HYDROXYZINE HCL 50 MG PO TABS
100.0000 mg | ORAL_TABLET | Freq: Three times a day (TID) | ORAL | 0 refills | Status: DC | PRN
  Filled 2023-11-28: qty 30, 5d supply, fill #0

## 2023-11-28 MED ORDER — FOLIC ACID 1 MG PO TABS
1.0000 mg | ORAL_TABLET | Freq: Every day | ORAL | 0 refills | Status: AC
  Filled 2023-11-28: qty 30, 30d supply, fill #0

## 2023-11-28 MED ORDER — GABAPENTIN 100 MG PO CAPS
200.0000 mg | ORAL_CAPSULE | Freq: Three times a day (TID) | ORAL | 0 refills | Status: DC
  Filled 2023-11-28: qty 180, 30d supply, fill #0

## 2023-11-28 MED ORDER — PRAZOSIN HCL 2 MG PO CAPS
2.0000 mg | ORAL_CAPSULE | Freq: Every day | ORAL | 0 refills | Status: DC
  Filled 2023-11-28: qty 30, 30d supply, fill #0

## 2023-11-28 MED ORDER — FLUVOXAMINE MALEATE 50 MG PO TABS
150.0000 mg | ORAL_TABLET | Freq: Every day | ORAL | 0 refills | Status: DC
  Filled 2023-11-28: qty 90, 30d supply, fill #0

## 2023-11-28 MED ORDER — CYANOCOBALAMIN 1000 MCG PO TABS
1000.0000 ug | ORAL_TABLET | Freq: Every day | ORAL | 0 refills | Status: AC
  Filled 2023-11-28: qty 30, 30d supply, fill #0

## 2023-11-28 MED ORDER — LEVETIRACETAM 1000 MG PO TABS
1000.0000 mg | ORAL_TABLET | Freq: Two times a day (BID) | ORAL | 0 refills | Status: DC
  Filled 2023-11-28: qty 60, 30d supply, fill #0

## 2023-11-28 MED ORDER — MIRTAZAPINE 15 MG PO TABS
15.0000 mg | ORAL_TABLET | Freq: Every evening | ORAL | 0 refills | Status: DC | PRN
  Filled 2023-11-28: qty 30, 30d supply, fill #0

## 2023-11-28 NOTE — [facility] Suicide Risk Assessment (Signed)
 [facility] INPATIENT:  Family/Significant Other Suicide Prevention Education  Suicide Prevention Education:  Family/Significant Other Refusal to Support Patient after Discharge:  Suicide Prevention Education Not Provided:  Patient has identified home of family/significant other as the place the patient will be residing after discharge.  With written consent of the patient, two attempts were made to provide Suicide Prevention Education to [facility] [phone]).  This person indicates he/she will not be responsible for the patient after discharge.  Glenis Smoker 11/28/2023,1:56 PM
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
## 2023-11-28 NOTE — Plan of Care (Signed)

## 2023-11-28 NOTE — Progress Notes (Signed)
  [facility] Adult Case Management Discharge Plan :  Will you be returning to the same living situation after discharge:  No. At discharge, do you have transportation home?: Yes,  pt received taxi voucher for trip.  Do you have the ability to pay for your medications: Yes,  [place] MEDICAID PREPAID HEALTH PLAN / [place] MEDICAID [facility]  Release of information consent forms completed and in the chart;  Patient's signature needed at discharge.  Patient to Follow up at:  Follow-up Information      [facility]   Why: Appointment is scheduled for 12/03/2023 at 3:00PM. Contact information: [address] [place]  [place], [place] [address] Phone: [phone] Fax: [phone]        [facility] Follow up.   Why: Appointment is scheduled for 12/01/23 at 2:15pm and it will be a virtual appointment Contact information: [address]  [address] [place] [address] [phone]                 Next level of care provider has access to [facility] Link:no  Safety Planning and Suicide Prevention discussed: Yes,  SPE completed with pt.      Has patient been referred to the Quitline?: Yes, faxed/e-referral on 11/28/23.  Patient has been referred for addiction treatment: No known substance use disorder.  Glenis Smoker, LCSW 11/28/2023, 1:50 PM
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
## 2023-11-28 NOTE — Progress Notes (Signed)
 Patient denies SI/I/AVH at this time. Discharge instructions, AVS, prescriptions, and transition record reviewed with patient. Patient agrees to comply with medication management, follow-up visit and outpatient therapy. Patient belongings returned to patient. Patient questions and concerns addressed and answered.  Patient ambulatory off unit. Patient discharged via Taxi to shelter.

## 2023-11-28 NOTE — Plan of Care (Signed)
   Problem: Education: Goal: Emotional status will improve Outcome: Progressing Goal: Mental status will improve Outcome: Progressing   Problem: Activity: Goal: Sleeping patterns will improve Outcome: Progressing

## 2023-11-28 NOTE — BHH Counselor (Signed)
 CSW contacted home health agencies regarding home health PT  [facility]- does not accept [facility] Medicaid  [facility]- do not accept pt insurance  [facility]- not accepting managed medicaid at this time.   [facility]- does not accept [facility]- does accept pt medicaid but due to pt housing status unable to follow.   [facility]- unable to make contact and HIPAA compliant voicemail left with contact information for follow through.   Vilma Meckel. Algis Greenhouse, MSW, LCSW, LCAS 11/28/2023 1:37 PM

## 2023-11-28 NOTE — Group Note (Signed)
 Recreation Therapy Group Note   Group Topic:Other  Group Date: 11/28/2023 Start Time: 1500 End Time: 1600 Facilitators: Rosina Lowenstein, LRT, CTRS Location: Courtyard  Group Description: [employer]. LRT and patients played games of basketball, drew with chalk, and played corn hole while outside in the courtyard while getting fresh air and sunlight. Music was being played in the background. LRT and peers conversed about different games they have played before, what they do in their free time and anything else that is on their minds. LRT encouraged pts to drink water after being outside, sweating and getting their heart rate up.  Goal Area(s) Addressed: Patient will build on frustration tolerance skills. Patients will partake in a competitive play game with peers. Patients will gain knowledge of new leisure interest/hobby.    Affect/Mood: N/A   Participation Level: Did not attend    Clinical Observations/Individualized Feedback: Patient did not attend group.   Plan: Continue to engage patient in RT group sessions 2-3x/week.   Rosina Lowenstein, LRT, CTRS 11/28/2023 4:32 PM

## 2023-11-28 NOTE — Progress Notes (Signed)
   11/28/23 1000  Psych Admission Type (Psych Patients Only)  Admission Status Voluntary  Psychosocial Assessment  Patient Complaints Anxiety;Worrying (Patient reports increased anxiety overnight due to the behavior of another patient. She also reports worry over not being able to afford her medications at discharge.)  Eye Contact Fair  Facial Expression Animated  Affect Anxious  Speech Logical/coherent  Interaction Assertive  Motor Activity Slow  Appearance/Hygiene Unremarkable;In scrubs  Behavior Characteristics Cooperative  Mood Anxious  Thought Process  Coherency WDL  Content WDL  Delusions None reported or observed  Perception WDL  Hallucination None reported or observed  Judgment WDL  Confusion None  Danger to Self  Current suicidal ideation? Denies  Agreement Not to Harm Self No  Danger to Others  Danger to Others None reported or observed

## 2023-11-28 NOTE — Group Note (Signed)
 Recreation Therapy Group Note   Group Topic:Leisure Education  Group Date: 11/28/2023 Start Time: 1015 End Time: 1115 Facilitators: Clinton Gallant, CTRS Location:  Craft Room  Group Description: Leisure. Patients were given the option to choose from singing karaoke, coloring mandalas, using oil pastels, journaling, or playing with play-doh. LRT and pts discussed the meaning of leisure, the importance of participating in leisure during their free time/when they're outside of the hospital, as well as how our leisure interests can also serve as coping skills.   Goal Area(s) Addressed:  Patient will identify a current leisure interest.  Patient will learn the definition of "leisure". Patient will practice making a positive decision. Patient will have the opportunity to try a new leisure activity. Patient will communicate with peers and LRT.    Affect/Mood: Appropriate   Participation Level: Active and Engaged   Participation Quality: Independent   Behavior: Appropriate, Calm, and Cooperative   Speech/Thought Process: Coherent   Insight: Good   Judgement: Good   Modes of Intervention: Activity, Exploration, and Music   Patient Response to Interventions:  Attentive, Engaged, Interested , and Receptive   Education Outcome:  Acknowledges education   Clinical Observations/Individualized Feedback: Hasna was active in their participation of session activities and group discussion. Pt identified "play with my dog, Pricilla Holm and sleep" as things she does in her free time. Pt chose to color while in group. Pt interacted well with LRT and peers duration of session.    Plan: Continue to engage patient in RT group sessions 2-3x/week.   Rosina Lowenstein, LRT, CTRS 11/28/2023 11:41 AM

## 2023-11-29 LAB — 25-HYDROXY VITAMIN D LCMS D2+D3
25-Hydroxy, Vitamin D-2: <1 ng/mL
25-Hydroxy, Vitamin D-3: 14 ng/mL
25-Hydroxy, Vitamin D: 14 ng/mL — ABNORMAL LOW

## 2023-11-30 ENCOUNTER — Emergency Department (HOSPITAL_COMMUNITY)
Admission: EM | Admit: 2023-11-30 | Discharge: 2023-11-30 | Disposition: A | Discharge status: Home / Self Care | Attending: Emergency Medicine | Admitting: Emergency Medicine

## 2023-11-30 ENCOUNTER — Encounter (HOSPITAL_COMMUNITY): Payer: Self-pay

## 2023-11-30 ENCOUNTER — Other Ambulatory Visit: Payer: Self-pay

## 2023-11-30 DIAGNOSIS — F4310 Post-traumatic stress disorder, unspecified: Secondary | ICD-10-CM | POA: Diagnosis not present

## 2023-11-30 DIAGNOSIS — F3181 Bipolar II disorder: Secondary | ICD-10-CM

## 2023-11-30 DIAGNOSIS — R569 Unspecified convulsions: Secondary | ICD-10-CM | POA: Insufficient documentation

## 2023-11-30 DIAGNOSIS — T50905A Adverse effect of unspecified drugs, medicaments and biological substances, initial encounter: Secondary | ICD-10-CM

## 2023-11-30 DIAGNOSIS — G2571 Drug induced akathisia: Secondary | ICD-10-CM | POA: Insufficient documentation

## 2023-11-30 DIAGNOSIS — T43295A Adverse effect of other antidepressants, initial encounter: Secondary | ICD-10-CM | POA: Diagnosis not present

## 2023-11-30 DIAGNOSIS — F319 Bipolar disorder, unspecified: Secondary | ICD-10-CM | POA: Insufficient documentation

## 2023-11-30 LAB — URINALYSIS, ROUTINE W REFLEX MICROSCOPIC
Bilirubin Urine: NEGATIVE
Glucose, UA: NEGATIVE mg/dL
Hgb urine dipstick: NEGATIVE
Ketones, ur: NEGATIVE mg/dL
Nitrite: NEGATIVE
Protein, ur: NEGATIVE mg/dL
Specific Gravity, Urine: 1.013 (ref 1.005–1.030)
pH: 6 (ref 5.0–8.0)

## 2023-11-30 LAB — COMPREHENSIVE METABOLIC PANEL WITH GFR
ALT: 24 U/L (ref 0–44)
AST: 26 U/L (ref 15–41)
Albumin: 4.2 g/dL (ref 3.5–5.0)
Alkaline Phosphatase: 71 U/L (ref 38–126)
Anion gap: 10 (ref 5–15)
BUN: 13 mg/dL (ref 6–20)
CO2: 27 mmol/L (ref 22–32)
Calcium: 9.5 mg/dL (ref 8.9–10.3)
Chloride: 103 mmol/L (ref 98–111)
Creatinine, Ser: 0.99 mg/dL (ref 0.44–1.00)
GFR, Estimated: >60 mL/min
Glucose, Bld: 117 mg/dL — ABNORMAL HIGH (ref 70–99)
Potassium: 3.8 mmol/L (ref 3.5–5.1)
Sodium: 140 mmol/L (ref 135–145)
Total Bilirubin: 0.4 mg/dL (ref 0.0–1.2)
Total Protein: 7.3 g/dL (ref 6.5–8.1)

## 2023-11-30 LAB — CBC WITH DIFFERENTIAL/PLATELET
Abs Immature Granulocytes: 0.03 K/uL (ref 0.00–0.07)
Basophils Absolute: 0 K/uL (ref 0.0–0.1)
Basophils Relative: 0 %
Eosinophils Absolute: 0 K/uL (ref 0.0–0.5)
Eosinophils Relative: 0 %
HCT: 36.6 % (ref 36.0–46.0)
Hemoglobin: 11.8 g/dL — ABNORMAL LOW (ref 12.0–15.0)
Immature Granulocytes: 1 %
Lymphocytes Relative: 22 %
Lymphs Abs: 1 K/uL (ref 0.7–4.0)
MCH: 29.7 pg (ref 26.0–34.0)
MCHC: 32.2 g/dL (ref 30.0–36.0)
MCV: 92.2 fL (ref 80.0–100.0)
Monocytes Absolute: 0.4 K/uL (ref 0.1–1.0)
Monocytes Relative: 9 %
Neutro Abs: 3.1 K/uL (ref 1.7–7.7)
Neutrophils Relative %: 68 %
Platelets: 233 K/uL (ref 150–400)
RBC: 3.97 MIL/uL (ref 3.87–5.11)
RDW: 12.4 % (ref 11.5–15.5)
WBC: 4.6 K/uL (ref 4.0–10.5)
nRBC: 0 % (ref 0.0–0.2)

## 2023-11-30 LAB — ETHANOL: Alcohol, Ethyl (B): <10 mg/dL

## 2023-11-30 LAB — RAPID URINE DRUG SCREEN, HOSP PERFORMED
Amphetamines: NOT DETECTED
Barbiturates: NOT DETECTED
Benzodiazepines: NOT DETECTED
Cocaine: NOT DETECTED
Opiates: NOT DETECTED
Tetrahydrocannabinol: NOT DETECTED

## 2023-11-30 LAB — CBG MONITORING, ED: Glucose-Capillary: 115 mg/dL — ABNORMAL HIGH (ref 70–99)

## 2023-11-30 MED ORDER — TRIHEXYPHENIDYL HCL 2 MG PO TABS
1.0000 mg | ORAL_TABLET | Freq: Once | ORAL | Status: DC
  Filled 2023-11-30: qty 1

## 2023-11-30 MED ORDER — OLANZAPINE 5 MG PO TABS: 5.0000 mg | ORAL_TABLET | Freq: Every day | ORAL | 1 refills | Status: DC

## 2023-11-30 MED ORDER — TRIHEXYPHENIDYL HCL 2 MG PO TABS: 1.0000 mg | ORAL_TABLET | Freq: Every day | ORAL | 0 refills | Status: DC

## 2023-11-30 MED ORDER — LEVETIRACETAM 250 MG PO TABS: 250.0000 mg | ORAL_TABLET | Freq: Once | ORAL | Status: DC

## 2023-11-30 MED ORDER — OLANZAPINE 5 MG PO TABS
5.0000 mg | ORAL_TABLET | Freq: Once | ORAL | Status: DC
  Filled 2023-11-30: qty 1

## 2023-11-30 NOTE — ED Triage Notes (Signed)
 Pt BIB EMS and presents from a motel. EMS was called out initially for a seizure. No signs for being post-ictal. Patient was shaking around like a seizure but EMS was able to re-direct patient during the shaking.   Pt a/o x 4.

## 2023-11-30 NOTE — ED Provider Notes (Signed)
 [place] EMERGENCY DEPARTMENT AT [facility] Provider Note   CSN: [id number] Arrival date & time: 11/30/23  1414     History {Add pertinent medical, surgical, social history, OB history to HPI:1} No chief complaint on file.   Madeline Shaw is a 48 y.o. female with a past medical history of psychiatric disorders including bipolar, PTSD, polysubstance abuse, possible seizure disorder.  Patient arrives via EMS reports that they were called out to a motel where they picked up the patient.  Upon their arrival patient was having flailing arm movements but her physical activity did not appear to be consistent with seizures.  During transport EMS reports that she had an episode of rapid blood pressure dropped to about 66 this was checked twice.  She states that she was not complaining of chest pain and was awake and alert throughout still having bizarre movements.  Patient denied having any chest pain.  This lasted very briefly and then rebounded.  HPI     Home Medications Prior to Admission medications   Medication Sig Start Date End Date Taking? Authorizing Provider  cyanocobalamin 1000 MCG tablet Take 1 tablet (1,000 mcg total) by mouth daily. 11/29/23   Tingling, Judeth Cornfield, PA-C  fluvoxaMINE (LUVOX) 50 MG tablet Take 3 tablets (150 mg total) by mouth at bedtime. 11/28/23   Tingling, Judeth Cornfield, PA-C  folic acid (FOLVITE) 1 MG tablet Take 1 tablet (1 mg total) by mouth daily. 11/29/23   Tingling, Judeth Cornfield, PA-C  gabapentin (NEURONTIN) 100 MG capsule Take 2 capsules (200 mg total) by mouth 3 (three) times daily. 11/28/23   Tingling, Judeth Cornfield, PA-C  hydrOXYzine (ATARAX) 50 MG tablet Take 2 tablets (100 mg total) by mouth 3 (three) times daily as needed for anxiety. 11/28/23   Tingling, Judeth Cornfield, PA-C  LamoTRIgine 250 MG TB24 24 hour tablet Take 1 tablet (250 mg total) by mouth daily. 11/29/23   Tingling, Judeth Cornfield, PA-C  levETIRAcetam (KEPPRA) 1000 MG tablet Take 1 tablet (1,000 mg  total) by mouth 2 (two) times daily. 11/28/23   Tingling, Judeth Cornfield, PA-C  mirtazapine (REMERON) 15 MG tablet Take 1 tablet (15 mg total) by mouth at bedtime as needed (sleep). 11/28/23   Tingling, Judeth Cornfield, PA-C  nicotine (NICODERM CQ - DOSED IN MG/24 HR) 7 mg/24hr patch Place 1 patch (7 mg total) onto the skin daily. 11/29/23   Tingling, Judeth Cornfield, PA-C  nicotine polacrilex (NICORETTE) 2 MG gum Take 1 each (2 mg total) by mouth as needed for smoking cessation. 11/28/23   Tingling, Stephanie, PA-C  prazosin (MINIPRESS) 2 MG capsule Take 1 capsule (2 mg total) by mouth at bedtime. 11/28/23   Tingling, Judeth Cornfield, PA-C  VENTOLIN HFA 108 (90 Base) MCG/ACT inhaler Inhale 1 puff into the lungs every 6 (six) hours as needed for wheezing or shortness of breath. 01/25/22   [provider]      Allergies    Sumatriptan, Acetaminophen, Chlorpheniramine, Topiramate, Dextromethorphan hbr, and Diphenhydramine hcl    Review of Systems   Review of Systems  Physical Exam Updated Vital Signs There were no vitals taken for this visit. Physical Exam Constitutional:      Comments: Unkempt and disheveled appearance.  Close are very dirty.  Hair is uncombed  HENT:     Head: Normocephalic and atraumatic.  Eyes:     Extraocular Movements: Extraocular movements intact.     Pupils: Pupils are equal, round, and reactive to light.  Cardiovascular:     Rate and Rhythm: Normal rate.  Pulmonary:  Effort: Pulmonary effort is normal.     Breath sounds: Normal breath sounds.  Abdominal:     General: Abdomen is flat.  Musculoskeletal:     Cervical back: Normal range of motion.  Skin:    General: Skin is warm and dry.     Findings: No rash.  Neurological:     Mental Status: She is alert.     Comments: Patient flailing arms and legs in different direction.  She is redirectable.  Movements appear to be more in line with some form of drug-induced akathisia.     ED Results / Procedures / Treatments    Labs (all labs ordered are listed, but only abnormal results are displayed) Labs Reviewed - No data to display  EKG None  Radiology No results found.  Procedures Procedures  {Document cardiac monitor, telemetry assessment procedure when appropriate:1}  Medications Ordered in ED Medications - No data to display  ED Course/ Medical Decision Making/ A&P   {   Click here for ABCD2, HEART and other calculatorsREFRESH Note before signing :1}                              Medical Decision Making Amount and/or Complexity of Data Reviewed Labs: ordered.   Patient exhibiting evidence of psychomotor agitation.  Per review of previous notes the patient has been previously very forthright and able to communicate effectively.  She certainly has a different mentation tonight and seems very confused, actively hallucinating.  She apparently has difficulty ambulating at baseline due to vertigo and imbalance and uses a walker all the time however still has pretty terrible balance.  Patient is currently is still actively hallucinating about a man named Jimmy at an elevator and try to use the bathroom by sitting on a bedside commode that was outside of the room.  She had to be redirected and is redirectable. I initially was going to send the patient back home with a walker however I have reconsidered given her confusion and her safety.  We are awaiting a urinalysis.  This may help [place] see if this is all drug-induced.  Secondly patient may just improve with some time however may need a repeat TTS consult.  {Document critical care time when appropriate:1} {Document review of labs and clinical decision tools ie heart score, Chads2Vasc2 etc:1}  {Document your independent review of radiology images, and any outside records:1} {Document your discussion with family members, caretakers, and with consultants:1} {Document social determinants of health affecting pt's care:1} {Document your decision making why or  why not admission, treatments were needed:1} Final Clinical Impression(s) / ED Diagnoses Final diagnoses:  None    Rx / DC Orders ED Discharge Orders     None

## 2023-11-30 NOTE — Discharge Instructions (Addendum)
 Appears that your abnormal movements are more likely drug-induced than actual seizures.  Your lab values are all normal.  You have not had a repeat seizure and your vital signs look great. Continue taking your regular medications and follow-up with your neurologist.  Get help right away if: You have or someone has seen you have: A seizure that lasts longer than 5 minutes. Many seizures in a row and you don't feel better between seizures. A seizure that makes it harder to breathe. A seizure that leaves you unable to speak or use a part of your body. You didn't wake up right away after a seizure. You injure yourself during a seizure. You have confusion or pain right after a seizure.   Psychiatry is recommending that you stop your prazosin.  We are starting you on trihexyphenidyl and olanzapine.  Please follow-up with your care team

## 2023-11-30 NOTE — Consult Note (Signed)
 [facility] Consult Note  Patient Name: Madeline Shaw MRN: [id number] DOB: [birth year] DATE OF Consult: 11/30/2023  PRIMARY PSYCHIATRIC DIAGNOSES  1.  Bipolar 2.  Medication reaction between prazosin and remeron 3.  PTSD  RECOMMENDATIONS  Recommendations: Medication recommendations: Stop Prazosin. Prazosin in combination with Remeron causes hypotension. Patient took these two night time medications prior to her seizure activity.  Start trihexyphenidyl 2 mg 1/2 tablet at bedtime for PTSD nightmares, Start Zyprexa Zydis 5 mg 1 po qday for Bipolar Continue other medications as prescribed Non-Medication/therapeutic recommendations: Patient to make appointment for follow up Is inpatient psychiatric hospitalization recommended for this patient? No (Explain why): pt is not having any psychiatric acuity From a psychiatric perspective, is this patient appropriate for discharge to an outpatient setting/resource or other less restrictive environment for continued care?  Yes (Explain why): pt was discharged from the psych unit 2 days ago. She denies any psychiatric acuity. She is here tonight for a medication reaction Follow-Up Telepsychiatry C/L services: We will sign off for now. Please re-consult our service if needed for any concerning changes in the patient's condition, discharge planning, or questions. Communication: Treatment team members (and family members if applicable) who were involved in treatment/care discussions and planning, and with whom we spoke or engaged with via secure text/chat, include the following: Dr. Charm Barges  Thank you for involving [place] in the care of this patient. If you have any additional questions or concerns, please call [phone] and ask for me or the provider on-call.  TELEPSYCHIATRY ATTESTATION & CONSENT  As the provider for this telehealth consult, I attest that I verified the patient's identity using two separate identifiers, introduced myself to the patient,  provided my credentials, disclosed my location, and performed this encounter via a HIPAA-compliant, real-time, face-to-face, two-way, interactive audio and video platform and with the full consent and agreement of the patient (or guardian as applicable.)  Patient physical location: [facility]. Telehealth provider physical location: home office in state of [place].  Video start time: 2010 (Central Time) Video end time: 2030 (Central Time)  IDENTIFYING DATA  Madeline Shaw is a 48 y.o. year-old female for whom a psychiatric consultation has been ordered by the primary provider. The patient was identified using two separate identifiers.  CHIEF COMPLAINT/REASON FOR CONSULT  History of psychiatric disorder  HISTORY OF PRESENT ILLNESS (HPI)  The patient was biba due possible seizure. Chart notes reveal that she was flailing around but did not look like a seizure. She was redirectable. EMS had noted that her BP was in the 60s en route but it did come back up.  The patient was noted by the ED MD to be confused and hallucinating so wanted to keep her for observation and have a psych evaluation. Her UDS came back clean so did not explain her behavior. The ED note has a documented BP for 100 systolic but the VS flowsheet showed all normal range Bps.  The patient was discharged from the hospital on the  28th. Review of the Bps during that stay reveal systolics ranging from 79-115.  The patient told me that she took her sleep medications prior to her episode and EMS being called. She was started on Prazosin in the psych hospital with Remeron at bedtime. The literature states to be careful with this combination as it will cause hypotension. I suspect that she took her medications, dropped her blood pressure and fainted and looked like she was having a seizure.  The combination of the  two medications are making her very tired and this is likely contributing to some of her confusion. She is A/O x 3  currently but is having some struggles with her timeline and the events that occurred. She is irritable, in general, and this is likely her baseline. She stated that she was on the Prazosin for her nightmares. I told her that there is a better medication and she is willing to try it. She also would Zyprexa Zydis for her mood disorder.  Of note her UDS was negative.   PAST PSYCHIATRIC HISTORY   Otherwise as per HPI above.  PAST MEDICAL HISTORY  Past Medical History:  Diagnosis Date   Anxiety    Cancer (HCC)    Depression    Gall stones    Seizures (HCC)    Stroke (HCC)    Vertigo      HOME MEDICATIONS  PTA Medications  Medication Sig   VENTOLIN HFA 108 (90 Base) MCG/ACT inhaler Inhale 1 puff into the lungs every 6 (six) hours as needed for wheezing or shortness of breath.   prazosin (MINIPRESS) 2 MG capsule Take 1 capsule (2 mg total) by mouth at bedtime.   fluvoxaMINE (LUVOX) 50 MG tablet Take 3 tablets (150 mg total) by mouth at bedtime.   hydrOXYzine (ATARAX) 50 MG tablet Take 2 tablets (100 mg total) by mouth 3 (three) times daily as needed for anxiety.   mirtazapine (REMERON) 15 MG tablet Take 1 tablet (15 mg total) by mouth at bedtime as needed (sleep).   nicotine (NICODERM CQ - DOSED IN MG/24 HR) 7 mg/24hr patch Place 1 patch (7 mg total) onto the skin daily.   nicotine polacrilex (NICORETTE) 2 MG gum Take 1 each (2 mg total) by mouth as needed for smoking cessation.   cyanocobalamin 1000 MCG tablet Take 1 tablet (1,000 mcg total) by mouth daily.   folic acid (FOLVITE) 1 MG tablet Take 1 tablet (1 mg total) by mouth daily.   gabapentin (NEURONTIN) 100 MG capsule Take 2 capsules (200 mg total) by mouth 3 (three) times daily.   LamoTRIgine 250 MG TB24 24 hour tablet Take 1 tablet (250 mg total) by mouth daily.   levETIRAcetam (KEPPRA) 1000 MG tablet Take 1 tablet (1,000 mg total) by mouth 2 (two) times daily.     ALLERGIES  Allergies  Allergen Reactions   Sumatriptan  Anaphylaxis, Rash and Swelling   Acetaminophen Other (See Comments)    Bladder spasms/pressure on bladder. Tolerates Tylenol.   Chlorpheniramine Other (See Comments)    Bladder Spasms (intolerance)  Other Reaction(s): Bladder Spasms (intolerance)    Reaction: Bladder Spasms (intolerance); Bladder spasms     Reaction: Bladder Spasms (intolerance); Bladder spasms   Bladder spasms    Bladder spasms   Topiramate     Unable to take - due to already taking Lamictal /beta-blockers contraindicated due to history of asthma.   Dextromethorphan Hbr     Bladder Spasms (intolerance)   Diphenhydramine Hcl     Bladder Spasms (intolerance)    SOCIAL & SUBSTANCE USE HISTORY  Social History   Socioeconomic History   Marital status: Legally Separated    Spouse name: Not on file   Number of children: Not on file   Years of education: Not on file   Highest education level: Not on file  Occupational History   Not on file  Tobacco Use   Smoking status: Former    Current packs/day: 0.00    Types: Cigarettes  Quit date: 72    Years since quitting: 27.2   Smokeless tobacco: Never   Tobacco comments:    Pt declined cessation teaching  Vaping Use   Vaping status: Every Day   Substances: Nicotine  Substance and Sexual Activity   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   Drug use: Not Currently    Types: Benzodiazepines    Comment: benzo's   Sexual activity: Yes    Birth control/protection: Surgical  Other Topics Concern   Not on file  Social History Narrative   Not on file   Social Drivers of Health   Financial Resource Strain: High Risk (06/28/2023)   Received from [facility]   Overall Financial Resource Strain (CARDIA)    Difficulty of Paying Living Expenses: Hard  Food Insecurity: Food Insecurity Present (11/20/2023)   Hunger Vital Sign    Worried About Running Out of Food in the Last Year: Sometimes true    Ran Out of Food in the Last Year:  Sometimes true  Transportation Needs: Unmet Transportation Needs (11/20/2023)   PRAPARE - Transportation    Lack of Transportation (Medical): Yes    Lack of Transportation (Non-Medical): Yes  Physical Activity: Sufficiently Active (06/28/2023)   Received from [facility]   Exercise Vital Sign    Days of Exercise per Week: 7 days    Minutes of Exercise per Session: 60 min  Stress: No Stress Concern Present (06/28/2023)   Received from [facility] of Occupational Health - Occupational Stress Questionnaire    Feeling of Stress : Only a little  Recent Concern: Stress - Stress Concern Present (04/11/2023)   Received from [facility] of Occupational Health - Occupational Stress Questionnaire    Feeling of Stress : Rather much  Social Connections: Socially Isolated (11/20/2023)   Social Connection and Isolation Panel [NHANES]    Frequency of Communication with Friends and Family: Never    Frequency of Social Gatherings with Friends and Family: Never    Attends Religious Services: Never    Database administrator or Organizations: No    Attends Engineer, structural: Never    Marital Status: Never married   Social History   Tobacco Use  Smoking Status Former   Current packs/day: 0.00   Types: Cigarettes   Quit date: 1998   Years since quitting: 27.2  Smokeless Tobacco Never  Tobacco Comments   Pt declined cessation teaching   Social History   Substance and Sexual Activity  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  FAMILY HISTORY  History reviewed. No pertinent family history. Family Psychiatric History (if known):  unknown  MENTAL STATUS EXAM (MSE)  Mental Status Exam: General Appearance: Casual  Orientation:  Full (Time, Place, and Person)   Memory:   good overall, somewhat distorted about the events in the ED. But this is post taking her sleep medications  Concentration:  Concentration: Good and Attention Span: Fair  Recall:  Fair  Attention  Good  Eye Contact:  Good  Speech:  Clear and Coherent  Language:  Good  Volume:  Increased  Mood: "I am fine" but complains of anxiety  Affect:  Full Range  Thought Process:  Goal Directed  Thought Content:  Logical and Illogical  Suicidal Thoughts:  No  Homicidal Thoughts:  No  Judgement:  Fair  Insight:  Fair  Psychomotor Activity:  Normal  Akathisia:  No  Fund of Knowledge:  Fair    Assets:  Communication Skills  Cognition:  WNL  ADL's:  Intact  AIMS (if indicated):       VITALS  Blood pressure (!) 141/81, pulse 86, temperature 98.9 F (37.2 C), temperature source Oral, resp. rate 18, SpO2 98%.  LABS  Admission on 11/30/2023  Component Date Value Ref Range Status   Sodium 11/30/2023 140  135 - 145 mmol/L Final   Potassium 11/30/2023 3.8  3.5 - 5.1 mmol/L Final   Chloride 11/30/2023 103  98 - 111 mmol/L Final   CO2 11/30/2023 27  22 - 32 mmol/L Final   Glucose, Bld 11/30/2023 117 (H)  70 - 99 mg/dL Final   Glucose reference range applies only to samples taken after fasting for at least 8 hours.   BUN 11/30/2023 13  6 - 20 mg/dL Final   Creatinine, Ser 11/30/2023 0.99  0.44 - 1.00 mg/dL Final   Calcium 16/06/9603 9.5  8.9 - 10.3 mg/dL Final   Total Protein 54/05/8118 7.3  6.5 - 8.1 g/dL Final   Albumin 14/78/2956 4.2  3.5 - 5.0 g/dL Final   AST 21/30/8657 26  15 - 41 U/L Final   ALT 11/30/2023 24  0 - 44 U/L Final   Alkaline Phosphatase 11/30/2023 71  38 - 126 U/L Final   Total Bilirubin 11/30/2023 0.4  0.0 - 1.2 mg/dL Final   GFR, Estimated 11/30/2023 >60  >60 mL/min Final   Comment: (NOTE) Calculated using the CKD-EPI Creatinine Equation (2021)    Anion gap 11/30/2023 10  5 - 15 Final   Performed at [facility], [address],  [place], [place] [address]   Glucose-Capillary 11/30/2023 115 (H)  70 - 99 mg/dL Final   Glucose reference range applies only to samples taken after fasting for at least 8 hours.   WBC 11/30/2023 4.6  4.0 - 10.5 K/uL Final   RBC 11/30/2023 3.97  3.87 - 5.11 MIL/uL Final   Hemoglobin 11/30/2023 11.8 (L)  12.0 - 15.0 g/dL Final   HCT 29/52/8413 36.6  36.0 - 46.0 % Final   MCV 11/30/2023 92.2  80.0 - 100.0 fL Final   MCH 11/30/2023 29.7  26.0 - 34.0 pg Final   MCHC 11/30/2023 32.2  30.0 - 36.0 g/dL Final   RDW 24/40/1027 12.4  11.5 - 15.5 % Final   Platelets 11/30/2023 233  150 - 400 K/uL Final   nRBC 11/30/2023 0.0  0.0 - 0.2 % Final   Neutrophils Relative % 11/30/2023 68  % Final   Neutro Abs 11/30/2023 3.1  1.7 - 7.7 K/uL Final   Lymphocytes Relative 11/30/2023 22  % Final   Lymphs Abs 11/30/2023 1.0  0.7 - 4.0 K/uL Final   Monocytes Relative 11/30/2023 9  % Final   Monocytes Absolute 11/30/2023 0.4  0.1 - 1.0 K/uL Final   Eosinophils Relative 11/30/2023 0  % Final   Eosinophils Absolute 11/30/2023 0.0  0.0 - 0.5 K/uL Final   Basophils Relative 11/30/2023 0  % Final   Basophils Absolute 11/30/2023 0.0  0.0 - 0.1 K/uL Final   Immature Granulocytes 11/30/2023 1  % Final   Abs Immature Granulocytes 11/30/2023 0.03  0.00 - 0.07 K/uL Final   Performed at [facility],  [address], [place], [place] [address]   Opiates 11/30/2023 NONE DETECTED  NONE DETECTED Final   Cocaine 11/30/2023 NONE DETECTED  NONE DETECTED Final   Benzodiazepines 11/30/2023 NONE DETECTED  NONE DETECTED Final   Amphetamines 11/30/2023 NONE DETECTED  NONE DETECTED Final   Tetrahydrocannabinol 11/30/2023 NONE DETECTED  NONE DETECTED Final   Barbiturates 11/30/2023 NONE DETECTED  NONE DETECTED Final   Comment: (NOTE) DRUG SCREEN FOR MEDICAL PURPOSES ONLY.  IF CONFIRMATION IS NEEDED FOR ANY PURPOSE, NOTIFY LAB WITHIN 5 DAYS.  LOWEST DETECTABLE LIMITS FOR URINE DRUG SCREEN Drug Class                      Cutoff (ng/mL) Amphetamine and metabolites    1000 Barbiturate and metabolites    200 Benzodiazepine                 200 Opiates and metabolites        300 Cocaine and metabolites        300 THC                            50 Performed at [facility], [address], [place], [place] [address]    Alcohol, Ethyl (B) 11/30/2023 <10  <10 mg/dL Final   Comment: (NOTE) Lowest detectable limit for serum alcohol is 10 mg/dL.  For medical purposes only. Performed at [facility], [address], [place], [place] [address]    Color, Urine 11/30/2023 YELLOW  YELLOW Final   APPearance 11/30/2023 CLEAR  CLEAR Final   Specific Gravity, Urine 11/30/2023 1.013  1.005 - 1.030 Final   pH 11/30/2023 6.0  5.0 - 8.0 Final   Glucose, UA 11/30/2023 NEGATIVE  NEGATIVE mg/dL Final   Hgb urine dipstick 11/30/2023 NEGATIVE  NEGATIVE Final   Bilirubin Urine 11/30/2023 NEGATIVE  NEGATIVE Final   Ketones, ur 11/30/2023 NEGATIVE  NEGATIVE mg/dL Final   Protein, ur 91/47/8295 NEGATIVE  NEGATIVE mg/dL Final   Nitrite 62/13/0865 NEGATIVE  NEGATIVE Final   Leukocytes,Ua 11/30/2023 SMALL (A)  NEGATIVE Final   RBC / HPF 11/30/2023 0-5  0 - 5 RBC/hpf Final   WBC, UA 11/30/2023 0-5  0 - 5 WBC/hpf Final   Bacteria, UA 11/30/2023 RARE (A)  NONE SEEN Final   Squamous Epithelial / HPF 11/30/2023 0-5  0 - 5 /HPF Final   Mucus 11/30/2023 PRESENT   Final   Performed at [facility], [address], [place], [place] [address]    PSYCHIATRIC REVIEW OF SYSTEMS (ROS)  ROS: Notable for the following relevant positive findings: ROS  Additional findings:      Musculoskeletal: No abnormal movements observed      Gait & Station: Wheelchair/Walker      Pain Screening: Denies      Nutrition & Dental Concerns:   RISK FORMULATION/ASSESSMENT  Is the patient experiencing any suicidal or homicidal ideations: No  Protective factors considered for safety management: not  si, no psychiatric acuity  Risk factors/concerns considered for safety management:  Impulsivity  Is there a safety management plan with the patient and treatment team to minimize risk factors and promote protective factors: Yes           Explain: was monitored in the ED Is crisis care placement or psychiatric hospitalization recommended: No     Based on my current evaluation and risk assessment, patient is determined at this time  to be at:  Low risk  *RISK ASSESSMENT Risk assessment is a dynamic process; it is possible that this patient's condition, and risk level, may change. This should be re-evaluated and managed over time as appropriate. Please re-consult psychiatric consult services if additional assistance is needed in terms of risk assessment and management. If your team decides to discharge this patient, please advise the patient how to best access emergency psychiatric services, or to call 911, if their condition worsens or they feel unsafe in any way.   Koren Shiver, NP Telepsychiatry Consult Services

## 2023-11-30 NOTE — ED Provider Notes (Signed)
 Patient was seen by psychiatry.  They did not feel the patient needed inpatient treatment.  They were making some recommendations for med adjustments as are concerned that the patient was having some hypotension and possible syncope related to her medications.   Terrilee Files, MD 12/01/23 [phone]

## 2023-11-30 NOTE — BH Assessment (Addendum)
 This patient has been referred to [facility] for her teleassesment.  Provider and other staff have been informed of referral via secure messaging.  [facility] coordinator Shari Prows said she would inform of time and provider via that secure messaging.

## 2023-12-04 ENCOUNTER — Encounter (HOSPITAL_COMMUNITY): Payer: Self-pay

## 2023-12-04 ENCOUNTER — Emergency Department (HOSPITAL_COMMUNITY)

## 2023-12-04 ENCOUNTER — Inpatient Hospital Stay (HOSPITAL_COMMUNITY)
Admission: EM | Admit: 2023-12-04 | Discharge: 2023-12-06 | DRG: 101 | Disposition: A | Discharge status: Other Acute Inpatient Hospital | Attending: Family Medicine | Admitting: Family Medicine

## 2023-12-04 ENCOUNTER — Other Ambulatory Visit: Payer: Self-pay

## 2023-12-04 DIAGNOSIS — G40909 Epilepsy, unspecified, not intractable, without status epilepticus: Principal | ICD-10-CM | POA: Diagnosis present

## 2023-12-04 DIAGNOSIS — Z91148 Patient's other noncompliance with medication regimen for other reason: Secondary | ICD-10-CM

## 2023-12-04 DIAGNOSIS — F314 Bipolar disorder, current episode depressed, severe, without psychotic features: Secondary | ICD-10-CM | POA: Diagnosis present

## 2023-12-04 DIAGNOSIS — Z5900 Homelessness unspecified: Secondary | ICD-10-CM

## 2023-12-04 DIAGNOSIS — Z90710 Acquired absence of both cervix and uterus: Secondary | ICD-10-CM

## 2023-12-04 DIAGNOSIS — Z9151 Personal history of suicidal behavior: Secondary | ICD-10-CM

## 2023-12-04 DIAGNOSIS — Z8673 Personal history of transient ischemic attack (TIA), and cerebral infarction without residual deficits: Secondary | ICD-10-CM

## 2023-12-04 DIAGNOSIS — R569 Unspecified convulsions: Secondary | ICD-10-CM | POA: Diagnosis not present

## 2023-12-04 DIAGNOSIS — F17290 Nicotine dependence, other tobacco product, uncomplicated: Secondary | ICD-10-CM | POA: Diagnosis present

## 2023-12-04 DIAGNOSIS — D649 Anemia, unspecified: Secondary | ICD-10-CM | POA: Diagnosis present

## 2023-12-04 DIAGNOSIS — Z79899 Other long term (current) drug therapy: Secondary | ICD-10-CM

## 2023-12-04 DIAGNOSIS — Z888 Allergy status to other drugs, medicaments and biological substances status: Secondary | ICD-10-CM

## 2023-12-04 DIAGNOSIS — R45850 Homicidal ideations: Secondary | ICD-10-CM | POA: Diagnosis present

## 2023-12-04 DIAGNOSIS — E876 Hypokalemia: Secondary | ICD-10-CM | POA: Diagnosis present

## 2023-12-04 DIAGNOSIS — J45909 Unspecified asthma, uncomplicated: Secondary | ICD-10-CM | POA: Diagnosis present

## 2023-12-04 DIAGNOSIS — F4310 Post-traumatic stress disorder, unspecified: Secondary | ICD-10-CM | POA: Diagnosis present

## 2023-12-04 DIAGNOSIS — F410 Panic disorder [episodic paroxysmal anxiety] without agoraphobia: Secondary | ICD-10-CM | POA: Diagnosis present

## 2023-12-04 DIAGNOSIS — Z886 Allergy status to analgesic agent status: Secondary | ICD-10-CM

## 2023-12-04 DIAGNOSIS — R45851 Suicidal ideations: Secondary | ICD-10-CM | POA: Diagnosis present

## 2023-12-04 LAB — URINALYSIS, ROUTINE W REFLEX MICROSCOPIC
Bacteria, UA: NONE SEEN
Bilirubin Urine: NEGATIVE
Glucose, UA: NEGATIVE mg/dL
Hgb urine dipstick: NEGATIVE
Ketones, ur: NEGATIVE mg/dL
Leukocytes,Ua: NEGATIVE
Nitrite: NEGATIVE
Protein, ur: 30 mg/dL — AB
Specific Gravity, Urine: 1.025 (ref 1.005–1.030)
pH: 5 (ref 5.0–8.0)

## 2023-12-04 LAB — CBC WITH DIFFERENTIAL/PLATELET
Abs Immature Granulocytes: 0.02 10*3/uL (ref 0.00–0.07)
Basophils Absolute: 0 10*3/uL (ref 0.0–0.1)
Basophils Relative: 1 %
Eosinophils Absolute: 0 10*3/uL (ref 0.0–0.5)
Eosinophils Relative: 0 %
HCT: 42.1 % (ref 36.0–46.0)
Hemoglobin: 13.8 g/dL (ref 12.0–15.0)
Immature Granulocytes: 0 %
Lymphocytes Relative: 24 %
Lymphs Abs: 1.1 10*3/uL (ref 0.7–4.0)
MCH: 29.5 pg (ref 26.0–34.0)
MCHC: 32.8 g/dL (ref 30.0–36.0)
MCV: 90 fL (ref 80.0–100.0)
Monocytes Absolute: 0.4 10*3/uL (ref 0.1–1.0)
Monocytes Relative: 8 %
Neutro Abs: 3.2 10*3/uL (ref 1.7–7.7)
Neutrophils Relative %: 67 %
Platelets: 254 10*3/uL (ref 150–400)
RBC: 4.68 MIL/uL (ref 3.87–5.11)
RDW: 12.1 % (ref 11.5–15.5)
WBC: 4.8 10*3/uL (ref 4.0–10.5)
nRBC: 0 % (ref 0.0–0.2)

## 2023-12-04 LAB — COMPREHENSIVE METABOLIC PANEL WITH GFR
ALT: 23 U/L (ref 0–44)
AST: 26 U/L (ref 15–41)
Albumin: 4.9 g/dL (ref 3.5–5.0)
Alkaline Phosphatase: 84 U/L (ref 38–126)
Anion gap: 12 (ref 5–15)
BUN: 13 mg/dL (ref 6–20)
CO2: 27 mmol/L (ref 22–32)
Calcium: 10.2 mg/dL (ref 8.9–10.3)
Chloride: 101 mmol/L (ref 98–111)
Creatinine, Ser: 0.98 mg/dL (ref 0.44–1.00)
GFR, Estimated: >60 mL/min (ref >60)
Glucose, Bld: 105 mg/dL — ABNORMAL HIGH (ref 70–99)
Potassium: 3.1 mmol/L — ABNORMAL LOW (ref 3.5–5.1)
Sodium: 140 mmol/L (ref 135–145)
Total Bilirubin: 0.6 mg/dL (ref 0.0–1.2)
Total Protein: 8.2 g/dL — ABNORMAL HIGH (ref 6.5–8.1)

## 2023-12-04 LAB — RAPID URINE DRUG SCREEN, HOSP PERFORMED
Amphetamines: NOT DETECTED
Barbiturates: NOT DETECTED
Benzodiazepines: NOT DETECTED
Cocaine: NOT DETECTED
Opiates: NOT DETECTED
Tetrahydrocannabinol: NOT DETECTED

## 2023-12-04 MED ORDER — FLUVOXAMINE MALEATE 50 MG PO TABS
150.0000 mg | ORAL_TABLET | Freq: Every day | ORAL | Status: DC
  Administered 2023-12-04 – 2023-12-06 (×3): 150 mg via ORAL
  Filled 2023-12-04 (×3): qty 3

## 2023-12-04 MED ORDER — OLANZAPINE 5 MG PO TABS: 5.0000 mg | ORAL_TABLET | Freq: Every day | ORAL | Status: DC

## 2023-12-04 MED ORDER — ALBUTEROL SULFATE (2.5 MG/3ML) 0.083% IN NEBU: 2.5000 mg | INHALATION_SOLUTION | RESPIRATORY_TRACT | Status: DC | PRN

## 2023-12-04 MED ORDER — ONDANSETRON HCL 4 MG PO TABS: 4.0000 mg | ORAL_TABLET | Freq: Four times a day (QID) | ORAL | Status: DC | PRN

## 2023-12-04 MED ORDER — ENOXAPARIN SODIUM 40 MG/0.4ML IJ SOSY
40.0000 mg | PREFILLED_SYRINGE | INTRAMUSCULAR | Status: DC
  Administered 2023-12-04: 40 mg via SUBCUTANEOUS
  Filled 2023-12-04: qty 0.4

## 2023-12-04 MED ORDER — LEVETIRACETAM IN NACL 1000 MG/100ML IV SOLN
1000.0000 mg | Freq: Once | INTRAVENOUS | Status: AC
  Administered 2023-12-04: 1000 mg via INTRAVENOUS
  Filled 2023-12-04: qty 100

## 2023-12-04 MED ORDER — LAMOTRIGINE ER 100 MG PO TB24
500.0000 mg | ORAL_TABLET | Freq: Every day | ORAL | Status: DC
  Administered 2023-12-04 – 2023-12-05 (×2): 500 mg via ORAL
  Filled 2023-12-04 (×2): qty 5

## 2023-12-04 MED ORDER — LAMOTRIGINE ER 250 MG PO TB24
250.0000 mg | ORAL_TABLET | Freq: Every day | ORAL | Status: DC
Start: 2023-12-04 — End: 2023-12-04

## 2023-12-04 MED ORDER — MIRTAZAPINE 7.5 MG PO TABS: 15.0000 mg | ORAL_TABLET | Freq: Every evening | ORAL | Status: DC | PRN

## 2023-12-04 MED ORDER — POTASSIUM CHLORIDE CRYS ER 20 MEQ PO TBCR
40.0000 meq | EXTENDED_RELEASE_TABLET | Freq: Once | ORAL | Status: AC
  Administered 2023-12-04: 40 meq via ORAL
  Filled 2023-12-04: qty 2

## 2023-12-04 MED ORDER — LEVETIRACETAM 500 MG PO TABS
1500.0000 mg | ORAL_TABLET | Freq: Two times a day (BID) | ORAL | Status: DC
  Administered 2023-12-04 – 2023-12-06 (×5): 1500 mg via ORAL
  Filled 2023-12-04 (×5): qty 3

## 2023-12-04 MED ORDER — GABAPENTIN 100 MG PO CAPS
200.0000 mg | ORAL_CAPSULE | Freq: Three times a day (TID) | ORAL | Status: DC
  Administered 2023-12-04 – 2023-12-06 (×6): 200 mg via ORAL
  Filled 2023-12-04 (×7): qty 2

## 2023-12-04 MED ORDER — IBUPROFEN 200 MG PO TABS: 400.0000 mg | ORAL_TABLET | Freq: Four times a day (QID) | ORAL | Status: DC | PRN

## 2023-12-04 MED ORDER — ONDANSETRON HCL 4 MG/2ML IJ SOLN: 4.0000 mg | Freq: Four times a day (QID) | INTRAMUSCULAR | Status: DC | PRN

## 2023-12-04 MED ORDER — TRIHEXYPHENIDYL HCL 2 MG PO TABS: 1.0000 mg | ORAL_TABLET | Freq: Every day | ORAL | Status: DC

## 2023-12-04 MED ORDER — HYDROXYZINE HCL 25 MG PO TABS
100.0000 mg | ORAL_TABLET | Freq: Three times a day (TID) | ORAL | Status: DC | PRN
  Administered 2023-12-04 – 2023-12-05 (×2): 100 mg via ORAL
  Filled 2023-12-04 (×2): qty 4

## 2023-12-04 MED ORDER — LEVETIRACETAM 500 MG PO TABS: 1000.0000 mg | ORAL_TABLET | Freq: Two times a day (BID) | ORAL | Status: DC

## 2023-12-04 NOTE — ED Provider Notes (Signed)
 [place] EMERGENCY DEPARTMENT AT [facility] Provider Note   CSN: [id number] Arrival date & time: 12/04/23  4098     History  Chief Complaint  Patient presents with   Seizures    Madeline Shaw is a 48 y.o. female.  Patient has a history of seizures.  Patient states that she had 13 seizures over the last week and 5 since last night.  She is taking Lamictal and Keppra.  The history is provided by the patient and medical records. No language interpreter was used.  Seizures Seizure activity on arrival: no   Seizure type:  Grand mal Preceding symptoms: headache   Initial focality:  None Episode characteristics: abnormal movements   Postictal symptoms: confusion        Home Medications Prior to Admission medications   Medication Sig Start Date End Date Taking? Authorizing Provider  cyanocobalamin 1000 MCG tablet Take 1 tablet (1,000 mcg total) by mouth daily. 11/29/23   Tingling, Judeth Cornfield, PA-C  fluvoxaMINE (LUVOX) 50 MG tablet Take 3 tablets (150 mg total) by mouth at bedtime. 11/28/23   Tingling, Judeth Cornfield, PA-C  folic acid (FOLVITE) 1 MG tablet Take 1 tablet (1 mg total) by mouth daily. 11/29/23   Tingling, Judeth Cornfield, PA-C  gabapentin (NEURONTIN) 100 MG capsule Take 2 capsules (200 mg total) by mouth 3 (three) times daily. 11/28/23   Tingling, Judeth Cornfield, PA-C  hydrOXYzine (ATARAX) 50 MG tablet Take 2 tablets (100 mg total) by mouth 3 (three) times daily as needed for anxiety. 11/28/23   Tingling, Judeth Cornfield, PA-C  LamoTRIgine 250 MG TB24 24 hour tablet Take 1 tablet (250 mg total) by mouth daily. 11/29/23   Tingling, Judeth Cornfield, PA-C  levETIRAcetam (KEPPRA) 1000 MG tablet Take 1 tablet (1,000 mg total) by mouth 2 (two) times daily. 11/28/23   Tingling, Judeth Cornfield, PA-C  mirtazapine (REMERON) 15 MG tablet Take 1 tablet (15 mg total) by mouth at bedtime as needed (sleep). 11/28/23   Tingling, Judeth Cornfield, PA-C  nicotine (NICODERM CQ - DOSED IN MG/24 HR) 7 mg/24hr patch Place  1 patch (7 mg total) onto the skin daily. 11/29/23   Tingling, Judeth Cornfield, PA-C  nicotine polacrilex (NICORETTE) 2 MG gum Take 1 each (2 mg total) by mouth as needed for smoking cessation. 11/28/23   Tingling, Stephanie, PA-C  OLANZapine (ZYPREXA) 5 MG tablet Take 1 tablet (5 mg total) by mouth daily. 11/30/23   Terrilee Files, MD  trihexyphenidyl (ARTANE) 2 MG tablet Take 0.5 tablets (1 mg total) by mouth at bedtime. 11/30/23   Terrilee Files, MD  VENTOLIN HFA 108 [phone] Base) MCG/ACT inhaler Inhale 1 puff into the lungs every 6 (six) hours as needed for wheezing or shortness of breath. 01/25/22   [provider]      Allergies    Sumatriptan, Acetaminophen, Chlorpheniramine, Topiramate, Dextromethorphan hbr, and Diphenhydramine hcl    Review of Systems   Review of Systems  Constitutional:  Negative for appetite change and fatigue.  HENT:  Negative for congestion, ear discharge and sinus pressure.   Eyes:  Negative for discharge.  Respiratory:  Negative for cough.   Cardiovascular:  Negative for chest pain.  Gastrointestinal:  Negative for abdominal pain and diarrhea.  Genitourinary:  Negative for frequency and hematuria.  Musculoskeletal:  Negative for back pain.  Skin:  Negative for rash.  Neurological:  Positive for seizures. Negative for headaches.  Psychiatric/Behavioral:  Negative for hallucinations.     Physical Exam Updated Vital Signs BP 114/70 (BP Location: Left Arm)  Pulse 84   Temp 98.3 F (36.8 C)   Resp 18   Ht 5\' 2"  (1.575 m)   Wt 50.8 kg   SpO2 100%   BMI 20.49 kg/m  Physical Exam Vitals and nursing note reviewed.  Constitutional:      Appearance: She is well-developed.  HENT:     Head: Normocephalic.     Comments: Bruising to the forehead    Right Ear: Tympanic membrane normal.     Left Ear: Tympanic membrane normal.     Nose: Nose normal.  Eyes:     General: No scleral icterus.    Conjunctiva/sclera: Conjunctivae normal.  Neck:     Thyroid:  No thyromegaly.  Cardiovascular:     Rate and Rhythm: Normal rate and regular rhythm.     Heart sounds: No murmur heard.    No friction rub. No gallop.  Pulmonary:     Breath sounds: No stridor. No wheezing or rales.  Chest:     Chest wall: No tenderness.  Abdominal:     General: There is no distension.     Tenderness: There is no abdominal tenderness. There is no rebound.  Musculoskeletal:        General: Normal range of motion.     Cervical back: Neck supple.  Lymphadenopathy:     Cervical: No cervical adenopathy.  Skin:    Findings: No erythema or rash.  Neurological:     Mental Status: She is alert and oriented to person, place, and time.     Motor: No abnormal muscle tone.     Coordination: Coordination normal.     Comments: Mild confusion  Psychiatric:        Behavior: Behavior normal.     ED Results / Procedures / Treatments   Labs (all labs ordered are listed, but only abnormal results are displayed) Labs Reviewed  COMPREHENSIVE METABOLIC PANEL WITH GFR - Abnormal; Notable for the following components:      Result Value   Potassium 3.1 (*)    Glucose, Bld 105 (*)    Total Protein 8.2 (*)    All other components within normal limits  CBC WITH DIFFERENTIAL/PLATELET  RAPID URINE DRUG SCREEN, HOSP PERFORMED  URINALYSIS, ROUTINE W REFLEX MICROSCOPIC  LAMOTRIGINE LEVEL  LEVETIRACETAM LEVEL    EKG None  Radiology CT Head Wo Contrast Result Date: 12/04/2023 CLINICAL DATA:  Head trauma. EXAM: CT HEAD WITHOUT CONTRAST TECHNIQUE: Contiguous axial images were obtained from the base of the skull through the vertex without intravenous contrast. RADIATION DOSE REDUCTION: This exam was performed according to the departmental dose-optimization program which includes automated exposure control, adjustment of the mA and/or kV according to patient size and/or use of iterative reconstruction technique. COMPARISON:  Head CT dated 11/20/2023. FINDINGS: Brain: The ventricles and  sulci are appropriate size for the patient's age. The gray-white matter discrimination is preserved. There is no acute intracranial hemorrhage. No mass effect or midline shift. No extra-axial fluid collection. Vascular: No hyperdense vessel or unexpected calcification. Skull: Normal. Negative for fracture or focal lesion. Sinuses/Orbits: No acute finding. Other: None IMPRESSION: No acute intracranial pathology. Electronically Signed   By: Elgie Collard M.D.   On: 12/04/2023 15:54    Procedures Procedures    Medications Ordered in ED Medications  levETIRAcetam (KEPPRA) IVPB 1000 mg/100 mL premix (0 mg Intravenous Stopped 12/04/23 1216)    ED Course/ Medical Decision Making/ A&P  I spoke to neurology and they recommended admission and observation overnight.  Keppra  and Lamictal level   CRITICAL CARE Performed by: Bethann Berkshire Total critical care time: 45 minutes Critical care time was exclusive of separately billable procedures and treating other patients. Critical care was necessary to treat or prevent imminent or life-threatening deterioration. Critical care was time spent personally by me on the following activities: development of treatment plan with patient and/or surrogate as well as nursing, discussions with consultants, evaluation of patient's response to treatment, examination of patient, obtaining history from patient or surrogate, ordering and performing treatments and interventions, ordering and review of laboratory studies, ordering and review of radiographic studies, pulse oximetry and re-evaluation of patient's condition.   Click here for ABCD2, HEART and other calculatorsREFRESH Note before signing :1}                              Medical Decision Making Amount and/or Complexity of Data Reviewed Labs: ordered. Radiology: ordered.  Risk Prescription drug management. Decision regarding hospitalization.   Persistent seizures.  Patient will be admitted to  medicine        Final Clinical Impression(s) / ED Diagnoses Final diagnoses:  None    Rx / DC Orders ED Discharge Orders     None         Bethann Berkshire, MD 12/07/23 1143

## 2023-12-04 NOTE — Progress Notes (Signed)
 Patient called charge nurse in the room to state "Hello, I need you to do something about the two ladies calling my son and talking shit about my dead mother". This RN reassured patient no one was talking about her family or calling her son. Patient states "I can hear them. Tell them its August [birth year], 1988 and I am not crazy and if they keep calling my son I am going to go handle them myself". This RN was able to deescalate patient. Patient reoriented to room and this RN educated patient on surroundings.

## 2023-12-04 NOTE — ED Provider Notes (Signed)
 48 yo female presenting with concern for persistent seizures Given IV keppra here, levels pending K 3.1  Planning observation admission - per neurology no consult emergently unless needed by hospitalist  Physical Exam  BP 114/70 (BP Location: Left Arm)   Pulse 84   Temp 98.3 F (36.8 C)   Resp 18   Ht 5\' 2"  (1.575 m)   Wt 50.8 kg   SpO2 100%   BMI 20.49 kg/m   Physical Exam  Procedures  Procedures  ED Course / MDM    Medical Decision Making Amount and/or Complexity of Data Reviewed Labs: ordered. Radiology: ordered.  Risk Prescription drug management. Decision regarding hospitalization.   5 pm - admitted to Dr Erenest Blank hospitalist       Renaye Rakers, Kermit Balo, MD 12/04/23 [phone]

## 2023-12-04 NOTE — ED Notes (Signed)
 Called Lab to add urinalysis.

## 2023-12-04 NOTE — ED Triage Notes (Signed)
 Patient presented to ER for seizure like activity. Patient reports she has had 4 seizure this morning, takes Keppra and Lamictal. Per EMS reports seizure was witnessed by friend-EMS did not witness any seizure activity en route.

## 2023-12-04 NOTE — ED Notes (Signed)
 Patient transported to CT

## 2023-12-04 NOTE — H&P (Signed)
 History and Physical  Madeline Shaw:[id number] DOB: 1976/06/06 DOA: 12/04/2023  PCP: [facility]   Chief Complaint: Seizures  HPI: Madeline Shaw is a 48 y.o. female with medical history significant for seizure disorder, bipolar disorder, PTSD being admitted to the hospital with concern for breakthrough seizures.  Patient tells me that her seizures have been well-controlled since about September 2024, starting in December her primary care doctor added Keppra to her Lamictal.  States that she has been compliant with her medications, has not been missing any doses.  She denies any recent illness, fevers, chills, abdominal pain, though says that she does have intermittent nausea and has vomited a few times in the last few days.  However, she states that she has kept her medication down.  Patient tells me that she has had 13 seizures over the last week or so, probably about 5 overnight.  She denies having any prodrome says that she feels fine, and will wake up to her dog licking her face.  Workup in the emergency department including urine drug screen, CT of the head as well as lab work relatively unremarkable.  She was given some oral potassium for mild hypokalemia.  ER provider discussed with neurologist on-call, who recommends checking Lamictal and Keppra levels, and continuing her home medications for the time being.  State that they do not need to see the patient in formal consultation unless she is having recurrent documented seizures.  Review of Systems: Please see HPI for pertinent positives and negatives. A complete 10 system review of systems are otherwise negative.  Past Medical History:  Diagnosis Date   Anxiety    Cancer (HCC)    Depression    Gall stones    Seizures (HCC)    Stroke (HCC)    Vertigo    Past Surgical History:  Procedure Laterality Date   ABDOMINAL HYSTERECTOMY     Social History:  reports that she quit smoking about 27 years ago. Her  smoking use included cigarettes. She has never used smokeless tobacco. She reports that she does not currently use alcohol. She reports that she does not currently use drugs after having used the following drugs: Benzodiazepines.  Allergies  Allergen Reactions   Sumatriptan Anaphylaxis, Rash and Swelling   Acetaminophen Other (See Comments)    Bladder spasms/pressure on bladder. Tolerates Tylenol.   Chlorpheniramine Other (See Comments)    Bladder Spasms (intolerance)  Other Reaction(s): Bladder Spasms (intolerance)    Reaction: Bladder Spasms (intolerance); Bladder spasms     Reaction: Bladder Spasms (intolerance); Bladder spasms   Bladder spasms    Bladder spasms   Topiramate     Unable to take - due to already taking Lamictal /beta-blockers contraindicated due to history of asthma.   Dextromethorphan Hbr     Bladder Spasms (intolerance)   Diphenhydramine Hcl     Bladder Spasms (intolerance)    History reviewed. No pertinent family history.   Prior to Admission medications   Medication Sig Start Date End Date Taking? Authorizing Provider  cyanocobalamin 1000 MCG tablet Take 1 tablet (1,000 mcg total) by mouth daily. 11/29/23   Tingling, Judeth Cornfield, PA-C  fluvoxaMINE (LUVOX) 50 MG tablet Take 3 tablets (150 mg total) by mouth at bedtime. 11/28/23   Tingling, Judeth Cornfield, PA-C  folic acid (FOLVITE) 1 MG tablet Take 1 tablet (1 mg total) by mouth daily. 11/29/23   Tingling, Stephanie, PA-C  gabapentin (NEURONTIN) 100 MG capsule Take 2 capsules (200 mg total) by  mouth 3 (three) times daily. 11/28/23   Tingling, Judeth Cornfield, PA-C  hydrOXYzine (ATARAX) 50 MG tablet Take 2 tablets (100 mg total) by mouth 3 (three) times daily as needed for anxiety. 11/28/23   Tingling, Judeth Cornfield, PA-C  LamoTRIgine 250 MG TB24 24 hour tablet Take 1 tablet (250 mg total) by mouth daily. 11/29/23   Tingling, Judeth Cornfield, PA-C  levETIRAcetam (KEPPRA) 1000 MG tablet Take 1 tablet (1,000 mg total) by mouth 2 (two) times  daily. 11/28/23   Tingling, Judeth Cornfield, PA-C  mirtazapine (REMERON) 15 MG tablet Take 1 tablet (15 mg total) by mouth at bedtime as needed (sleep). 11/28/23   Tingling, Judeth Cornfield, PA-C  nicotine (NICODERM CQ - DOSED IN MG/24 HR) 7 mg/24hr patch Place 1 patch (7 mg total) onto the skin daily. 11/29/23   Tingling, Judeth Cornfield, PA-C  nicotine polacrilex (NICORETTE) 2 MG gum Take 1 each (2 mg total) by mouth as needed for smoking cessation. 11/28/23   Tingling, Stephanie, PA-C  OLANZapine (ZYPREXA) 5 MG tablet Take 1 tablet (5 mg total) by mouth daily. 11/30/23   Terrilee Files, MD  trihexyphenidyl (ARTANE) 2 MG tablet Take 0.5 tablets (1 mg total) by mouth at bedtime. 11/30/23   Terrilee Files, MD  VENTOLIN HFA 108 [phone] Base) MCG/ACT inhaler Inhale 1 puff into the lungs every 6 (six) hours as needed for wheezing or shortness of breath. 01/25/22   [provider]    Physical Exam: BP 114/70 (BP Location: Left Arm)   Pulse 84   Temp 98.3 F (36.8 C)   Resp 18   Ht 5\' 2"  (1.575 m)   Wt 50.8 kg   SpO2 100%   BMI 20.49 kg/m  General:  Alert, oriented, calm, in no acute distress, overall disheveled but comfortable Cardiovascular: RRR, no murmurs or rubs, no peripheral edema  Respiratory: clear to auscultation bilaterally, no wheezes, no crackles  Abdomen: soft, nontender, nondistended, normal bowel tones heard  Skin: dry, no rashes  Musculoskeletal: no joint effusions, normal range of motion  Psychiatric: appropriate affect, normal speech  Neurologic: extraocular muscles intact, clear speech, moving all extremities with intact sensorium         Labs on Admission:  Basic Metabolic Panel: Recent Labs  Lab 11/30/23 1446 12/04/23 1037  NA 140 140  K 3.8 3.1*  CL 103 101  CO2 27 27  GLUCOSE 117* 105*  BUN 13 13  CREATININE 0.99 0.98  CALCIUM 9.5 10.2   Liver Function Tests: Recent Labs  Lab 11/30/23 1446 12/04/23 1037  AST 26 26  ALT 24 23  ALKPHOS 71 84  BILITOT 0.4 0.6   PROT 7.3 8.2*  ALBUMIN 4.2 4.9   No results for input(s): "LIPASE", "AMYLASE" in the last 168 hours. No results for input(s): "AMMONIA" in the last 168 hours. CBC: Recent Labs  Lab 11/30/23 1446 12/04/23 1037  WBC 4.6 4.8  NEUTROABS 3.1 3.2  HGB 11.8* 13.8  HCT 36.6 42.1  MCV 92.2 90.0  PLT 233 254   Cardiac Enzymes: No results for input(s): "CKTOTAL", "CKMB", "CKMBINDEX", "TROPONINI" in the last 168 hours. BNP (last 3 results) No results for input(s): "BNP" in the last 8760 hours.  ProBNP (last 3 results) No results for input(s): "PROBNP" in the last 8760 hours.  CBG: Recent Labs  Lab 11/30/23 1443  GLUCAP 115*    Radiological Exams on Admission: CT Head Wo Contrast Result Date: 12/04/2023 CLINICAL DATA:  Head trauma. EXAM: CT HEAD WITHOUT CONTRAST TECHNIQUE: Contiguous axial images were  obtained from the base of the skull through the vertex without intravenous contrast. RADIATION DOSE REDUCTION: This exam was performed according to the departmental dose-optimization program which includes automated exposure control, adjustment of the mA and/or kV according to patient size and/or use of iterative reconstruction technique. COMPARISON:  Head CT dated 11/20/2023. FINDINGS: Brain: The ventricles and sulci are appropriate size for the patient's age. The gray-white matter discrimination is preserved. There is no acute intracranial hemorrhage. No mass effect or midline shift. No extra-axial fluid collection. Vascular: No hyperdense vessel or unexpected calcification. Skull: Normal. Negative for fracture or focal lesion. Sinuses/Orbits: No acute finding. Other: None IMPRESSION: No acute intracranial pathology. Electronically Signed   By: Elgie Collard M.D.   On: 12/04/2023 15:54   Assessment/Plan Madeline Shaw is a 48 y.o. female with medical history significant for seizure disorder, bipolar disorder, PTSD being admitted to the hospital with concern for breakthrough seizures.     Seizure disorder-with complaints of breakthrough seizures, though patient does have a history of medication noncompliance.  ER provider discussed with neurologist on-call, who recommends continuing home medications and close observation. -Observation admission -Seizure precautions -Keppra 1000 mg p.o. twice daily -Lamictal 250 mg 24-hour tablet -Follow-up Keppra and Lamictal levels -Formal neuro consultation in case of recurrent seizure  Anxiety-Atarax as needed  Hypokalemia-likely due to recent vomiting, repleted orally, will recheck in the morning  Bipolar disorder/PTSD-continue home Luvox, olanzapine, Artane  DVT prophylaxis: Lovenox     Code Status: Full Code  Consults called: ER provider discussed with neurology, who do not plan to formally consult unless further questions arise.  Admission status: Observation  Time spent: 48 minutes  Dandrae Kustra Sharlette Dense MD [facility] Pager [phone]  If 7PM-7AM, please contact night-coverage [email or web address] Password [facility]  12/04/2023, 5:49 PM

## 2023-12-05 DIAGNOSIS — E876 Hypokalemia: Secondary | ICD-10-CM | POA: Diagnosis present

## 2023-12-05 DIAGNOSIS — F314 Bipolar disorder, current episode depressed, severe, without psychotic features: Secondary | ICD-10-CM | POA: Diagnosis present

## 2023-12-05 DIAGNOSIS — Z79899 Other long term (current) drug therapy: Secondary | ICD-10-CM | POA: Diagnosis not present

## 2023-12-05 DIAGNOSIS — Z8673 Personal history of transient ischemic attack (TIA), and cerebral infarction without residual deficits: Secondary | ICD-10-CM | POA: Diagnosis not present

## 2023-12-05 DIAGNOSIS — F17290 Nicotine dependence, other tobacco product, uncomplicated: Secondary | ICD-10-CM | POA: Diagnosis present

## 2023-12-05 DIAGNOSIS — G40909 Epilepsy, unspecified, not intractable, without status epilepticus: Secondary | ICD-10-CM | POA: Diagnosis present

## 2023-12-05 DIAGNOSIS — Z90710 Acquired absence of both cervix and uterus: Secondary | ICD-10-CM | POA: Diagnosis not present

## 2023-12-05 DIAGNOSIS — Z5900 Homelessness unspecified: Secondary | ICD-10-CM | POA: Diagnosis not present

## 2023-12-05 DIAGNOSIS — F410 Panic disorder [episodic paroxysmal anxiety] without agoraphobia: Secondary | ICD-10-CM | POA: Diagnosis present

## 2023-12-05 DIAGNOSIS — R569 Unspecified convulsions: Secondary | ICD-10-CM | POA: Diagnosis not present

## 2023-12-05 DIAGNOSIS — F4310 Post-traumatic stress disorder, unspecified: Secondary | ICD-10-CM | POA: Diagnosis present

## 2023-12-05 DIAGNOSIS — D649 Anemia, unspecified: Secondary | ICD-10-CM | POA: Diagnosis present

## 2023-12-05 DIAGNOSIS — Z886 Allergy status to analgesic agent status: Secondary | ICD-10-CM | POA: Diagnosis not present

## 2023-12-05 DIAGNOSIS — Z91148 Patient's other noncompliance with medication regimen for other reason: Secondary | ICD-10-CM | POA: Diagnosis not present

## 2023-12-05 DIAGNOSIS — R45850 Homicidal ideations: Secondary | ICD-10-CM | POA: Diagnosis present

## 2023-12-05 DIAGNOSIS — Z888 Allergy status to other drugs, medicaments and biological substances status: Secondary | ICD-10-CM | POA: Diagnosis not present

## 2023-12-05 DIAGNOSIS — Z9151 Personal history of suicidal behavior: Secondary | ICD-10-CM | POA: Diagnosis not present

## 2023-12-05 DIAGNOSIS — R45851 Suicidal ideations: Secondary | ICD-10-CM | POA: Diagnosis present

## 2023-12-05 DIAGNOSIS — J45909 Unspecified asthma, uncomplicated: Secondary | ICD-10-CM | POA: Diagnosis present

## 2023-12-05 LAB — BASIC METABOLIC PANEL WITH GFR
Anion gap: 12 (ref 5–15)
BUN: 14 mg/dL (ref 6–20)
CO2: 23 mmol/L (ref 22–32)
Calcium: 9.3 mg/dL (ref 8.9–10.3)
Chloride: 105 mmol/L (ref 98–111)
Creatinine, Ser: 0.91 mg/dL (ref 0.44–1.00)
GFR, Estimated: >60 mL/min (ref >60)
Glucose, Bld: 96 mg/dL (ref 70–99)
Potassium: 3.6 mmol/L (ref 3.5–5.1)
Sodium: 140 mmol/L (ref 135–145)

## 2023-12-05 LAB — CBC
HCT: 35.5 % — ABNORMAL LOW (ref 36.0–46.0)
Hemoglobin: 11.8 g/dL — ABNORMAL LOW (ref 12.0–15.0)
MCH: 30 pg (ref 26.0–34.0)
MCHC: 33.2 g/dL (ref 30.0–36.0)
MCV: 90.3 fL (ref 80.0–100.0)
Platelets: 217 10*3/uL (ref 150–400)
RBC: 3.93 MIL/uL (ref 3.87–5.11)
RDW: 11.9 % (ref 11.5–15.5)
WBC: 4.4 10*3/uL (ref 4.0–10.5)
nRBC: 0 % (ref 0.0–0.2)

## 2023-12-05 LAB — LEVETIRACETAM LEVEL: Levetiracetam Lvl: 48.2 ug/mL — ABNORMAL HIGH (ref 10.0–40.0)

## 2023-12-05 LAB — LAMOTRIGINE LEVEL: Lamotrigine Lvl: 30.5 ug/mL — ABNORMAL HIGH (ref 2.0–20.0)

## 2023-12-05 MED ORDER — LAMOTRIGINE ER 50 MG PO TB24: 250.0000 mg | ORAL_TABLET | Freq: Every day | ORAL | Status: DC

## 2023-12-05 MED ORDER — HALOPERIDOL LACTATE 5 MG/ML IJ SOLN
5.0000 mg | Freq: Three times a day (TID) | INTRAMUSCULAR | Status: DC
  Administered 2023-12-05 – 2023-12-06 (×3): 5 mg via INTRAVENOUS
  Filled 2023-12-05 (×3): qty 1

## 2023-12-05 NOTE — Plan of Care (Signed)

## 2023-12-05 NOTE — Progress Notes (Signed)
 Patient is verbalizing that she needs to leave the hospital. Dr Rennis Chris made aware and advised pt is alert and oriented and is able to leave AMA.

## 2023-12-05 NOTE — Progress Notes (Signed)
 PROGRESS NOTE    Madeline Shaw  EPP:[id number] DOB: 07-Aug-1976 DOA: 12/04/2023 PCP: [facility]  Chief Complaint  Patient presents with   Seizures    Hospital Course:  Madeline Shaw is 48 y.o. female with seizure disorder, bipolar disorder, PTSD, polysubstance abuse, who was admitted to the hospital for concern of breakthrough seizures.  Patient reported seizures have been well-controlled.  She reports her primary care doctor recently added Keppra to Lamictal.  She denies any missed doses of her medications.  She does report that she has recently had nausea and has vomited a few times in the last few days but believes that she is kept her medicine down.  She reports that she has had 13 seizures over the last week.  She reports she regularly wakes up with her dog licking her face.  Workup in the ED including UDS and head CT was unremarkable.  She received some potassium for hypokalemia.  EDP discussed directly with neurology who recommended Lamictal and Keppra levels and to resume home meds. Patient was in the ED on 3/30 for possible seizure activity.  What was witnessed at that time was determined to be consistent with psychomotor agitation.  She was found to be having active hallucinations.  Psychiatry was consulted, determined she did not require inpatient admission.  Subjective: Prior to my arrival patient was having hallucinations and exhibiting fantastical thinking per bedside RN.  At the time of my arrival patient is calm, redirectable.  She is alert and oriented x 4.  She is able to accurately report why she is in the hospital.  She reports she was distressed earlier about her dog but this has been handled.  She believes she needs to stay in the hospital as she is currently unable to ambulate independently.  She reports she has not had a seizure since arrival. She also believes that the nurses have tampered with her medication which is the reason that she had a  seizure.  We discussed that the seizure she had was at home and not in the hospital.  Objective: Vitals:   12/04/23 1624 12/04/23 1827 12/04/23 2226 12/05/23 0309  BP: 114/70 118/83 111/68 123/78  Pulse: 84 79 81 83  Resp: 18 18 16 18   Temp: 98.3 F (36.8 C) 98.2 F (36.8 C) 97.9 F (36.6 C) 98.3 F (36.8 C)  TempSrc:  Oral    SpO2: 100% 99% 99% 98%  Weight:      Height:       No intake or output data in the 24 hours ending 12/05/23 0919 Filed Weights   12/04/23 1046  Weight: 50.8 kg    Examination: General exam: Appears calm and comfortable, NAD  Respiratory system: No work of breathing, symmetric chest wall expansion Cardiovascular system: S1 & S2 heard, RRR.  Gastrointestinal system: Abdomen is nondistended, soft and nontender.  Neuro: Alert and oriented. No focal neurological deficits. Extremities: Symmetric, expected ROM Skin: No rashes, lesions Psychiatry: Mood and affect congruent.  Calm.  Does exhibit some tangential thought process and fantastical thinking.  Not actively hallucinating.  Able to sustain conversation.  Assessment & Plan:  Principal Problem:   Seizure (HCC)    Seizure disorder with complaint of breakthrough seizure - History of medication nonadherence - EDP discussed with neurology on-call who recommends continuing home meds and observation for now Head CT unremarkable - Continue seizure precautions - Continue to monitor on telemetry - Continue Keppra and Lamictal - Keppra and Lamictal doses  both appear to be supratherapeutic.  Will consult pharmacology for assistance with dosing - If seizure recurs will consult neurology  Bipolar disorder PTSD - Will resume home meds - Patient is acutely agitated this morning, overnight RN reports hallucinations.  Patient does demonstrate some fantastical thinking. - Patient is intermittently requesting to be discharged.  At this time she is alert and oriented and does have the capacity to leave AGAINST  MEDICAL ADVICE.  But it does appear her mental status waxes and wanes.  Will consult psychiatry for formal capacity evaluation. - Patient recently evaluated by psychiatry and was determined not to require inpatient admission during most recent ED visit 3/30 - As needed Haldol.  Normocytic anemia - Hemoglobin above baseline.  Continue to monitor.  History of polysubstance abuse - UDS negative on arrival  High risk of readmission - Experiencing homelessness - Patient has had 10 ED visits in March alone. - TOC consult for resource assistance.  Debility Ambulatory dysfunction - Appears this is chronic.  Patient previously attending outpatient rehab.  Has previously been recommended for SNF but denied placement - Repeat PT evals pending  DVT prophylaxis: SCDs avoid Lovenox for now given patient's recent seizures and propensity to hit her head.   Code Status: Full Code Family Communication:  Discussed directly with patient Disposition: Remains inpatient, monitoring for seizure.  Pending psychiatry evaluation.  PT eval pending.  TOC consult for resource assistance  Consultants:    Procedures:    Antimicrobials:  Anti-infectives (From admission, onward)    None       Data Reviewed: I have personally reviewed following labs and imaging studies CBC: Recent Labs  Lab 11/30/23 1446 12/04/23 1037 12/05/23 0428  WBC 4.6 4.8 4.4  NEUTROABS 3.1 3.2  --   HGB 11.8* 13.8 11.8*  HCT 36.6 42.1 35.5*  MCV 92.2 90.0 90.3  PLT 233 254 217   Basic Metabolic Panel: Recent Labs  Lab 11/30/23 1446 12/04/23 1037 12/05/23 0428  NA 140 140 140  K 3.8 3.1* 3.6  CL 103 101 105  CO2 27 27 23   GLUCOSE 117* 105* 96  BUN 13 13 14   CREATININE 0.99 0.98 0.91  CALCIUM 9.5 10.2 9.3   GFR: Estimated Creatinine Clearance: 58.5 mL/min (by C-G formula based on SCr of 0.91 mg/dL). Liver Function Tests: Recent Labs  Lab 11/30/23 1446 12/04/23 1037  AST 26 26  ALT 24 23  ALKPHOS 71 84   BILITOT 0.4 0.6  PROT 7.3 8.2*  ALBUMIN 4.2 4.9   CBG: Recent Labs  Lab 11/30/23 1443  GLUCAP 115*    No results found for this or any previous visit (from the past 240 hours).   Radiology Studies: CT Head Wo Contrast Result Date: 12/04/2023 CLINICAL DATA:  Head trauma. EXAM: CT HEAD WITHOUT CONTRAST TECHNIQUE: Contiguous axial images were obtained from the base of the skull through the vertex without intravenous contrast. RADIATION DOSE REDUCTION: This exam was performed according to the departmental dose-optimization program which includes automated exposure control, adjustment of the mA and/or kV according to patient size and/or use of iterative reconstruction technique. COMPARISON:  Head CT dated 11/20/2023. FINDINGS: Brain: The ventricles and sulci are appropriate size for the patient's age. The gray-white matter discrimination is preserved. There is no acute intracranial hemorrhage. No mass effect or midline shift. No extra-axial fluid collection. Vascular: No hyperdense vessel or unexpected calcification. Skull: Normal. Negative for fracture or focal lesion. Sinuses/Orbits: No acute finding. Other: None IMPRESSION: No acute intracranial pathology.  Electronically Signed   By: Elgie Collard M.D.   On: 12/04/2023 15:54    Scheduled Meds:  enoxaparin (LOVENOX) injection  40 mg Subcutaneous Q24H   fluvoxaMINE  150 mg Oral QHS   gabapentin  200 mg Oral TID   lamoTRIgine  500 mg Oral Daily   levETIRAcetam  1,500 mg Oral BID   Continuous Infusions:   LOS: 0 days  MDM: Patient is high risk for one or more organ failure.  They necessitate ongoing hospitalization for continued IV therapies and subsequent lab monitoring. Total time spent interpreting labs and vitals, coordinating care amongst consultants and care team members, directly assessing and discussing care with the patient and/or family: 55 min    Debarah Crape, DO [facility]  To contact the attending physician  between 7A-7P please use Epic Chat. To contact the covering physician during after hours 7P-7A, please review Amion.   12/05/2023, 9:19 AM   *This document has been created with the assistance of dictation software. Please excuse typographical errors. *

## 2023-12-05 NOTE — Progress Notes (Signed)
 patient is verbalizing that she does not want to leave anymore but wants to be transferred to another unit. Dr Rennis Chris made aware and placed order for psych to eval capacity. Awaiting for psych.

## 2023-12-05 NOTE — Progress Notes (Signed)
 Patient is stating she is having a mental breakdown because of her dog. Pt attended to and redirected multiple times. Continues to be AAOX4. she verbalized that she called the police and animal control in regards to her dog and needs to be escorted to her right now. Dr Rennis Chris made aware. Obtained order for haldol. Pt is refusing haldol medication.
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
## 2023-12-06 ENCOUNTER — Inpatient Hospital Stay (HOSPITAL_COMMUNITY)
Admission: AD | Admit: 2023-12-06 | Discharge: 2023-12-11 | DRG: 885 | Disposition: A | Discharge status: Home / Self Care | Source: Intra-hospital | Attending: Psychiatry | Admitting: Psychiatry

## 2023-12-06 ENCOUNTER — Encounter (HOSPITAL_COMMUNITY): Payer: Self-pay | Admitting: Psychiatry

## 2023-12-06 ENCOUNTER — Other Ambulatory Visit: Payer: Self-pay

## 2023-12-06 DIAGNOSIS — F314 Bipolar disorder, current episode depressed, severe, without psychotic features: Secondary | ICD-10-CM | POA: Diagnosis present

## 2023-12-06 DIAGNOSIS — Z90710 Acquired absence of both cervix and uterus: Secondary | ICD-10-CM | POA: Diagnosis not present

## 2023-12-06 DIAGNOSIS — R45851 Suicidal ideations: Secondary | ICD-10-CM | POA: Diagnosis present

## 2023-12-06 DIAGNOSIS — F17290 Nicotine dependence, other tobacco product, uncomplicated: Secondary | ICD-10-CM | POA: Diagnosis present

## 2023-12-06 DIAGNOSIS — F1311 Sedative, hypnotic or anxiolytic abuse, in remission: Secondary | ICD-10-CM | POA: Diagnosis present

## 2023-12-06 DIAGNOSIS — G40909 Epilepsy, unspecified, not intractable, without status epilepticus: Secondary | ICD-10-CM | POA: Diagnosis present

## 2023-12-06 DIAGNOSIS — F419 Anxiety disorder, unspecified: Secondary | ICD-10-CM | POA: Diagnosis present

## 2023-12-06 DIAGNOSIS — F4310 Post-traumatic stress disorder, unspecified: Secondary | ICD-10-CM | POA: Diagnosis present

## 2023-12-06 DIAGNOSIS — Z888 Allergy status to other drugs, medicaments and biological substances status: Secondary | ICD-10-CM | POA: Diagnosis not present

## 2023-12-06 DIAGNOSIS — Z8673 Personal history of transient ischemic attack (TIA), and cerebral infarction without residual deficits: Secondary | ICD-10-CM | POA: Diagnosis not present

## 2023-12-06 DIAGNOSIS — Z9151 Personal history of suicidal behavior: Secondary | ICD-10-CM | POA: Diagnosis not present

## 2023-12-06 DIAGNOSIS — Z635 Disruption of family by separation and divorce: Secondary | ICD-10-CM

## 2023-12-06 DIAGNOSIS — R569 Unspecified convulsions: Secondary | ICD-10-CM | POA: Diagnosis not present

## 2023-12-06 DIAGNOSIS — G4700 Insomnia, unspecified: Secondary | ICD-10-CM | POA: Diagnosis present

## 2023-12-06 DIAGNOSIS — Z79899 Other long term (current) drug therapy: Secondary | ICD-10-CM | POA: Diagnosis not present

## 2023-12-06 DIAGNOSIS — Z5986 Financial insecurity: Secondary | ICD-10-CM | POA: Diagnosis not present

## 2023-12-06 DIAGNOSIS — F603 Borderline personality disorder: Secondary | ICD-10-CM | POA: Diagnosis present

## 2023-12-06 DIAGNOSIS — Z5982 Transportation insecurity: Secondary | ICD-10-CM

## 2023-12-06 DIAGNOSIS — Z87820 Personal history of traumatic brain injury: Secondary | ICD-10-CM | POA: Diagnosis not present

## 2023-12-06 DIAGNOSIS — F319 Bipolar disorder, unspecified: Principal | ICD-10-CM | POA: Diagnosis present

## 2023-12-06 MED ORDER — IBUPROFEN 400 MG PO TABS
400.0000 mg | ORAL_TABLET | Freq: Four times a day (QID) | ORAL | Status: DC | PRN
  Administered 2023-12-09 – 2023-12-11 (×3): 400 mg via ORAL
  Filled 2023-12-06 (×3): qty 1

## 2023-12-06 MED ORDER — HYDROXYZINE HCL 50 MG PO TABS
50.0000 mg | ORAL_TABLET | Freq: Three times a day (TID) | ORAL | Status: DC | PRN
  Administered 2023-12-08 – 2023-12-10 (×7): 50 mg via ORAL
  Filled 2023-12-06 (×6): qty 1

## 2023-12-06 MED ORDER — LEVETIRACETAM 500 MG PO TABS
1500.0000 mg | ORAL_TABLET | Freq: Two times a day (BID) | ORAL | Status: DC
  Administered 2023-12-07 – 2023-12-11 (×9): 1500 mg via ORAL
  Filled 2023-12-06 (×5): qty 3
  Filled 2023-12-06: qty 2
  Filled 2023-12-06 (×5): qty 3
  Filled 2023-12-06: qty 2
  Filled 2023-12-06: qty 3

## 2023-12-06 MED ORDER — ALBUTEROL SULFATE (2.5 MG/3ML) 0.083% IN NEBU: 2.5000 mg | INHALATION_SOLUTION | RESPIRATORY_TRACT | Status: DC | PRN

## 2023-12-06 MED ORDER — MAGNESIUM HYDROXIDE 400 MG/5ML PO SUSP: 30.0000 mL | Freq: Every day | ORAL | Status: DC | PRN

## 2023-12-06 MED ORDER — HYDROXYZINE HCL 25 MG PO TABS: 50.0000 mg | ORAL_TABLET | Freq: Three times a day (TID) | ORAL | Status: DC | PRN

## 2023-12-06 MED ORDER — LAMOTRIGINE ER 250 MG PO TB24: 250.0000 mg | ORAL_TABLET | Freq: Every day | ORAL | Status: DC

## 2023-12-06 MED ORDER — LEVETIRACETAM 750 MG PO TABS: 1500.0000 mg | ORAL_TABLET | Freq: Two times a day (BID) | ORAL | Status: DC

## 2023-12-06 MED ORDER — MIRTAZAPINE 15 MG PO TABS: 15.0000 mg | ORAL_TABLET | Freq: Every evening | ORAL | Status: DC | PRN

## 2023-12-06 MED ORDER — FLUVOXAMINE MALEATE 50 MG PO TABS
150.0000 mg | ORAL_TABLET | Freq: Every day | ORAL | Status: DC
  Administered 2023-12-07: 150 mg via ORAL
  Filled 2023-12-06 (×4): qty 1

## 2023-12-06 MED ORDER — ALUM & MAG HYDROXIDE-SIMETH 200-200-20 MG/5ML PO SUSP: 30.0000 mL | ORAL | Status: DC | PRN

## 2023-12-06 MED ORDER — HALOPERIDOL 5 MG PO TABS
5.0000 mg | ORAL_TABLET | Freq: Three times a day (TID) | ORAL | Status: DC | PRN
  Administered 2023-12-09: 5 mg via ORAL
  Filled 2023-12-06: qty 1

## 2023-12-06 MED ORDER — GABAPENTIN 100 MG PO CAPS
200.0000 mg | ORAL_CAPSULE | Freq: Three times a day (TID) | ORAL | Status: DC
  Administered 2023-12-07 (×3): 200 mg via ORAL
  Filled 2023-12-06 (×7): qty 2

## 2023-12-06 MED ORDER — HALOPERIDOL 5 MG PO TABS: 5.0000 mg | ORAL_TABLET | Freq: Three times a day (TID) | ORAL | Status: DC | PRN

## 2023-12-06 MED ORDER — NICOTINE 14 MG/24HR TD PT24
14.0000 mg | MEDICATED_PATCH | Freq: Every day | TRANSDERMAL | Status: DC
  Administered 2023-12-07 – 2023-12-11 (×5): 14 mg via TRANSDERMAL
  Filled 2023-12-06 (×6): qty 1

## 2023-12-06 NOTE — Plan of Care (Addendum)
 Pt states she wanted to talk to psychiatry again, RN asked her about what.  Pt states "I feel homicidal, like I want to hurt people."  RN asked if pt had plan or intent, pt states "I sort of have a plan."  RN asked if she would elaborate.  Pt states " I don't want to talk to anyone about it except psychiatry.  Psychiatry Shaune Pollack, Herminio Heads, NP notified and aware  ------  Clarified with psychiatry and attending if pt needs a sitter or not.  Psychiatry NP does not think pt needs a sitter.

## 2023-12-06 NOTE — Plan of Care (Signed)
   Problem: Education: Goal: Knowledge of [place] [facility] Education information/materials will improve Outcome: Progressing Goal: Emotional status will improve Outcome: Not Progressing Goal: Mental status will improve Outcome: Not Progressing

## 2023-12-06 NOTE — Tx Team (Signed)
 Initial Treatment Plan 12/06/2023 11:37 PM DAPHINE LOCH ZOX:[id number]    PATIENT STRESSORS: Financial difficulties   Health problems     PATIENT STRENGTHS: Motivation for treatment/growth  Supportive family/friends    PATIENT IDENTIFIED PROBLEMS: Suicidal  Depression  Anxiety      "Help getting back to Me"  "Help getting rid of crazy thoughts"         DISCHARGE CRITERIA:  Improved stabilization in mood, thinking, and/or behavior Reduction of life-threatening or endangering symptoms to within safe limits  PRELIMINARY DISCHARGE PLAN: Outpatient therapy  PATIENT/FAMILY INVOLVEMENT: This treatment plan has been presented to and reviewed with the patient, LINSY EHRESMAN.  The patient and family have been given the opportunity to ask questions and make suggestions.  Marcie Bal, RN 12/06/2023, 11:37 PM

## 2023-12-06 NOTE — Discharge Summary (Signed)
 Physician Discharge Summary   Patient: Madeline Shaw MRN: [id number] DOB: [birth year]  Admit date:     12/04/2023  Discharge date: 12/06/23  Discharge Physician: Debarah Crape   PCP: [facility]   Recommendations at discharge:    Discharge directly to Inpatient Psychiatric unit Follow-up closely with neurology for seizure monitoring outpatient  Discharge Diagnoses: Principal Problem:   Seizure [facility]) Active Problems:   Bipolar affective disorder, depressed, severe (HCC)  Resolved Problems:   * No resolved hospital problems. *  Hospital Course: Madeline Shaw is 48 y.o. female with seizure disorder, bipolar disorder, PTSD, polysubstance abuse, who was admitted to the hospital for concern of breakthrough seizures.  Patient reported seizures have been well-controlled previously.  She reports her primary care doctor recently added Keppra to Lamictal.  She denies any missed doses of her medications.  She does report that she has recently had nausea and has vomited a few times in the last few days but believes that she is kept her medicine down.  She reports that she has had 13 seizures over the last week.  Workup in the ED including UDS and head CT was unremarkable.  She received some potassium for hypokalemia.  EDP discussed directly with neurology who recommended Lamictal and Keppra levels and to resume home meds. Lamictal and Keppra levels were found to be supratherapeutic.  Discussed directly with pharmacology who recommends holding Lamictal for 2 days to allow some clearance and then resuming at 250 mg twice daily instead of the 500 mg that patient endorses taking (refill history supports 250mg  BID).  Had no witnessed seizures during this admission or during ED stay. Patient was intermittently hallucinating though she did remain calm and redirectable.  She also endorsed vague homicidal ideation with no intent or plan.  She was evaluated by psychiatry who  recommended inpatient hospital stay.  Patient was amenable to this and agreed to voluntary admission.  Psychiatry did recommend IVC if patient attempts to leave the hospital. Patient will be discharging today directly to inpatient psych.   Seizure disorder with complaint of breakthrough seizure -- Patient endorses multiple seizures prior to arrival, has had none since arrival - History of medication nonadherence and history of non epileptiform seizure - EDP discussed with neurology on-call who requests formal consultation only if patient has new seizure while admitted - Head CT unremarkable - Continue seizure precautions - Keppra and Lamictal doses both appear to be supratherapeutic.  Have discussed with pharmacology.  Holding Lamictal for 2 days to allow some clearance. Then resume Lamictal at 250 twice daily on 4/7.  Appears patient has been taking 500 twice daily but prescription history indicates 250mg  prescribed   Bipolar disorder PTSD - Resume home meds for now, will defer titration to psychiatry team. - Patient is intermittently hallucinating though remains calm and redirectable.  She is also endorsing homicidal ideation to her RN. - Behavioral health team consulted, and currently recommending inpatient psychiatric hospitalization.  She is medically cleared to do this.  Patient is amendable to this plan and will proceed with voluntary admission.  Psychiatry has recommended IVC if patient attempts to leave the hospital. - Continue with as needed Haldol PO   Normocytic anemia - Hemoglobin stable   History of polysubstance abuse - UDS negative on arrival   High risk of readmission - Experiencing homelessness - Patient has had 10 ED visits in March alone. - TOC consulted for resource assistance.   Debility Ambulatory dysfunction - Appears this  is chronic.  Patient previously attending outpatient rehab.  Has previously been recommended for SNF but denied placement   Disposition:   Psychiatric unit Diet recommendation:  Discharge Diet Orders (From admission, onward)     Start     Ordered   12/06/23 0000  Diet general        12/06/23 1748           Regular diet DISCHARGE MEDICATION: Allergies as of 12/06/2023       Reactions   Sumatriptan Anaphylaxis, Rash, Swelling   Chlorpheniramine Other (See Comments)   Bladder Spasms (intolerance)   Topiramate    Unable to take - due to already taking Lamictal /beta-blockers contraindicated due to history of asthma.   Triaminic Fever Reducer [acetaminophen] Other (See Comments)   Bladder spasms/pressure on bladder. Tolerates "Tylenol".   Dextromethorphan Hbr    Bladder Spasms (intolerance)   Diphenhydramine Hcl    Bladder Spasms (intolerance)        Medication List     STOP taking these medications    cephALEXin 500 MG capsule Commonly known as: KEFLEX   hydrOXYzine 50 MG tablet Commonly known as: ATARAX   Latuda 20 MG Tabs tablet Generic drug: lurasidone   meclizine 25 MG tablet Commonly known as: ANTIVERT   mirtazapine 15 MG tablet Commonly known as: REMERON   NICODERM CQ TD   nicotine 7 mg/24hr patch Commonly known as: NICODERM CQ - dosed in mg/24 hr   nicotine polacrilex 2 MG gum Commonly known as: NICORETTE   OLANZapine 5 MG tablet Commonly known as: ZyPREXA   ondansetron 4 MG tablet Commonly known as: ZOFRAN   promethazine 12.5 MG tablet Commonly known as: PHENERGAN   rizatriptan 10 MG tablet Commonly known as: MAXALT   traMADol 50 MG tablet Commonly known as: ULTRAM   trihexyphenidyl 2 MG tablet Commonly known as: ARTANE       TAKE these medications    cyanocobalamin 1000 MCG tablet Commonly known as: VITAMIN B12 Take 1 tablet (1,000 mcg total) by mouth daily.   ferrous sulfate 325 (65 FE) MG tablet Take 325 mg by mouth daily with breakfast.   fluvoxaMINE 50 MG tablet Commonly known as: LUVOX Take 3 tablets (150 mg total) by mouth at bedtime.   folic acid 1  MG tablet Commonly known as: FOLVITE Take 1 tablet (1 mg total) by mouth daily.   gabapentin 100 MG capsule Commonly known as: NEURONTIN Take 2 capsules (200 mg total) by mouth 3 (three) times daily. What changed: how much to take   hydrOXYzine 100 MG capsule Commonly known as: VISTARIL Take 200 mg by mouth 4 (four) times daily.   LamoTRIgine 250 MG Tb24 24 hour tablet Take 1 tablet (250 mg total) by mouth daily. Start taking on: December 08, 2023 What changed: These instructions start on December 08, 2023. If you are unsure what to do until then, ask your doctor or other care provider.   levETIRAcetam 750 MG tablet Commonly known as: KEPPRA Take 2 tablets (1,500 mg total) by mouth 2 (two) times daily. What changed:  medication strength how much to take   multivitamin tablet Take 1 tablet by mouth daily with breakfast.   Ventolin HFA 108 (90 Base) MCG/ACT inhaler Generic drug: albuterol Inhale 2 puffs into the lungs every 6 (six) hours as needed for wheezing or shortness of breath.        Discharge Exam: Filed Weights   12/04/23 1046  Weight: 50.8 kg   General  exam: Appears calm and comfortable, NAD  Respiratory system: No work of breathing, symmetric chest wall expansion Cardiovascular system: S1 & S2 heard, RRR.  Gastrointestinal system: Abdomen is nondistended, soft and nontender.  Neuro: Alert and oriented. Extremities: Symmetric, expected ROM Skin: No rashes, lesions Psychiatry: Mood and affect congruent.  Calm.  Does exhibit some tangential thought process and requires prompting.  Not actively hallucinating.  Able to sustain conversation.  Condition at discharge: stable  The results of significant diagnostics from this hospitalization (including imaging, microbiology, ancillary and laboratory) are listed below for reference.   Imaging Studies: CT Head Wo Contrast Result Date: 12/04/2023 CLINICAL DATA:  Head trauma. EXAM: CT HEAD WITHOUT CONTRAST TECHNIQUE:  Contiguous axial images were obtained from the base of the skull through the vertex without intravenous contrast. RADIATION DOSE REDUCTION: This exam was performed according to the departmental dose-optimization program which includes automated exposure control, adjustment of the mA and/or kV according to patient size and/or use of iterative reconstruction technique. COMPARISON:  Head CT dated 11/20/2023. FINDINGS: Brain: The ventricles and sulci are appropriate size for the patient's age. The gray-white matter discrimination is preserved. There is no acute intracranial hemorrhage. No mass effect or midline shift. No extra-axial fluid collection. Vascular: No hyperdense vessel or unexpected calcification. Skull: Normal. Negative for fracture or focal lesion. Sinuses/Orbits: No acute finding. Other: None IMPRESSION: No acute intracranial pathology. Electronically Signed   By: Elgie Collard M.D.   On: 12/04/2023 15:54   CT Cervical Spine Wo Contrast Result Date: 11/20/2023 CLINICAL DATA:  48 year old female with possible seizure activity, "Uncontrollable movements". Recent outside hospitalization reportedly with lumbar puncture and CNS MRI evaluation. EXAM: CT CERVICAL SPINE WITHOUT CONTRAST TECHNIQUE: Multidetector CT imaging of the cervical spine was performed without intravenous contrast. Multiplanar CT image reconstructions were also generated. RADIATION DOSE REDUCTION: This exam was performed according to the departmental dose-optimization program which includes automated exposure control, adjustment of the mA and/or kV according to patient size and/or use of iterative reconstruction technique. COMPARISON:  CT head and face today.  Cervical spine CT 03/26/2023. FINDINGS: Alignment: Stable straightening of cervical lordosis. Mild chronic anterolisthesis of C4 on C5 is stable. Cervicothoracic junction alignment is within normal limits. Bilateral posterior element alignment is within normal limits. Skull base  and vertebrae: Bone mineralization is within normal limits. Visualized skull base is intact. No atlanto-occipital dissociation. C1 and C2 appear intact and aligned. No acute osseous abnormality identified. Soft tissues and spinal canal: No prevertebral fluid or swelling. No visible canal hematoma. Negative noncontrast neck soft tissues aside from punctate right parotid sialolithiasis. Mild motion artifact at the larynx. Disc levels:  Stable and mild for age cervical spine degeneration. Upper chest: Visible upper thoracic levels appear intact. Negative noncontrast thoracic inlet. There is evidence of centrilobular emphysema in the lung apices. IMPRESSION: 1. No acute traumatic injury identified in the cervical spine. 2. Mild for age cervical spine degeneration. 3.  Emphysema (ICD10-J43.9). Electronically Signed   By: Odessa Fleming M.D.   On: 11/20/2023 06:11   CT Maxillofacial Wo Contrast Result Date: 11/20/2023 CLINICAL DATA:  48 year old female with possible seizure activity, "Uncontrollable movements". Recent outside hospitalization reportedly with lumbar puncture and CNS MRI evaluation. EXAM: CT MAXILLOFACIAL WITHOUT CONTRAST TECHNIQUE: Multidetector CT imaging of the maxillofacial structures was performed. Multiplanar CT image reconstructions were also generated. RADIATION DOSE REDUCTION: This exam was performed according to the departmental dose-optimization program which includes automated exposure control, adjustment of the mA and/or kV according to patient size and/or  use of iterative reconstruction technique. COMPARISON:  Head CT today. Face CT 11/15/2023. FINDINGS: Osseous: Mandible intact and normally located. Carious right mandible residual molar, stable. Bilateral maxilla, zygoma, pterygoid, nasal bones appear stable and intact. Visible skull base intact. Orbits: No orbital wall fracture. Globes and bilateral orbital soft tissues appear normal. Sinuses: Visualized paranasal sinuses and mastoids are stable  and well aerated. Soft tissues: Left nasal piercing. Negative visible noncontrast larynx, pharynx, parapharyngeal spaces, retropharyngeal space, sublingual space, submandibular spaces, and masticator spaces. Punctate parotid sialolithiasis on the right. Visible parotid glands otherwise negative. Left forehead scalp soft tissue injury with skin staples detailed on head CT separately. Limited intracranial: Stable to that reported separately today. IMPRESSION: Negative noncontrast CT appearance of the Face aside from - forehead scalp soft tissue injury detailed on Head CT today. - carious right mandible molar. Electronically Signed   By: Odessa Fleming M.D.   On: 11/20/2023 06:08   CT Head Wo Contrast Result Date: 11/20/2023 CLINICAL DATA:  48 year old female with possible seizure activity, "Uncontrollable movements". Recent outside hospitalization reportedly with with lumbar puncture and CNS MRI evaluation. EXAM: CT HEAD WITHOUT CONTRAST TECHNIQUE: Contiguous axial images were obtained from the base of the skull through the vertex without intravenous contrast. RADIATION DOSE REDUCTION: This exam was performed according to the departmental dose-optimization program which includes automated exposure control, adjustment of the mA and/or kV according to patient size and/or use of iterative reconstruction technique. COMPARISON:  Head CT 11/15/2023 and earlier. Face and cervical spine CT today reported separately. FINDINGS: Brain: Cerebral volume is stable and within normal limits. Mild motion artifact at the vertex today. No midline shift, ventriculomegaly, mass effect, evidence of mass lesion, intracranial hemorrhage or evidence of cortically based acute infarction. Gray-white matter differentiation is within normal limits throughout the brain. Vascular: No suspicious intracranial vascular hyperdensity. Skull: Appears stable and intact. Sinuses/Orbits: Visualized paranasal sinuses and mastoids are stable and well aerated.  Other: No gaze deviation. Left forehead skin staples in place, mild regional hematoma or contusion which has regressed compared to 11/15/2023. IMPRESSION: 1. Stable and normal noncontrast CT appearance of the brain. 2. Left forehead scalp soft tissue injury with skin staples in place. Hematoma/contusion there regressed since 11/15/2023. Electronically Signed   By: Odessa Fleming M.D.   On: 11/20/2023 06:05    Microbiology: Results for orders placed or performed during the hospital encounter of 11/20/23  Urine Culture (for pregnant, neutropenic or urologic patients or patients with an indwelling urinary catheter)     Status: None   Collection Time: 11/20/23  6:11 AM   Specimen: Urine, Clean Catch  Result Value Ref Range Status   Specimen Description   Final    URINE, CLEAN CATCH Performed at [facility], [address], [place], [place] [address]    Special Requests   Final    NONE Performed at [facility], [address] [place], [place] [address]    Culture   Final    NO GROWTH Performed at [facility], [address] [place], [place] [address]    Report Status 11/21/2023 FINAL  Final    Labs: CBC: Recent Labs  Lab 11/30/23 1446 12/04/23 1037 12/05/23 0428  WBC 4.6 4.8 4.4  NEUTROABS 3.1 3.2  --   HGB 11.8* 13.8 11.8*  HCT 36.6 42.1 35.5*  MCV 92.2 90.0 90.3  PLT 233 254 217   Basic Metabolic Panel: Recent Labs  Lab 11/30/23 1446 12/04/23 1037 12/05/23 0428  NA 140 140 140  K 3.8 3.1* 3.6  CL 103 101 105  CO2 27 27 23   GLUCOSE 117* 105* 96  BUN 13 13 14   CREATININE 0.99 0.98 0.91  CALCIUM 9.5 10.2 9.3   Liver Function Tests: Recent Labs  Lab 11/30/23 1446 12/04/23 1037  AST 26 26  ALT 24 23  ALKPHOS 71 84  BILITOT 0.4 0.6  PROT 7.3 8.2*  ALBUMIN 4.2 4.9   CBG: Recent Labs  Lab 11/30/23 1443  GLUCAP 115*    Discharge time spent: 31 minutes.  Signed: Debarah Crape, DO [facility] 12/06/2023

## 2023-12-06 NOTE — Hospital Course (Signed)
 Madeline Shaw is 48 y.o. female with seizure disorder, bipolar disorder, PTSD, polysubstance abuse, who was admitted to the hospital for concern of breakthrough seizures.  Patient reported seizures have been well-controlled previously.  She reports her primary care doctor recently added Keppra to Lamictal.  She denies any missed doses of her medications.  She does report that she has recently had nausea and has vomited a few times in the last few days but believes that she is kept her medicine down.  She reports that she has had 13 seizures over the last week.  Workup in the ED including UDS and head CT was unremarkable.  She received some potassium for hypokalemia.  EDP discussed directly with neurology who recommended Lamictal and Keppra levels and to resume home meds. Lamictal and Keppra levels were found to be supratherapeutic.  Discussed directly with pharmacology who recommends holding Lamictal for 2 days to allow some clearance and then resuming at 250 mg twice daily instead of the 500 mg that patient endorses taking (refill history supports 250mg  BID).  Had no witnessed seizures during this admission or during ED stay. Patient was intermittently hallucinating though she did remain calm and redirectable.  She also endorsed vague homicidal ideation with no intent or plan.  She was evaluated by psychiatry who recommended inpatient hospital stay.  Patient was amenable to this and agreed to voluntary admission.  Psychiatry did recommend IVC if patient attempts to leave the hospital. Patient will be discharging today directly to inpatient psych.   Seizure disorder with complaint of breakthrough seizure -- Patient endorses multiple seizures prior to arrival, has had none since arrival - History of medication nonadherence and history of non epileptiform seizure - EDP discussed with neurology on-call who requests formal consultation only if patient has new seizure while admitted - Head CT  unremarkable - Continue seizure precautions - Keppra and Lamictal doses both appear to be supratherapeutic.  Have discussed with pharmacology.  Holding Lamictal for 2 days to allow some clearance. Then resume Lamictal at 250 twice daily on 4/7.  Appears patient has been taking 500 twice daily but prescription history indicates 250mg  prescribed   Bipolar disorder PTSD - Resume home meds for now, will defer titration to psychiatry team. - Patient is intermittently hallucinating though remains calm and redirectable.  She is also endorsing homicidal ideation to her RN. - Behavioral health team consulted, and currently recommending inpatient psychiatric hospitalization.  She is medically cleared to do this.  Patient is amendable to this plan and will proceed with voluntary admission.  Psychiatry has recommended IVC if patient attempts to leave the hospital. - Continue with as needed Haldol PO   Normocytic anemia - Hemoglobin stable   History of polysubstance abuse - UDS negative on arrival   High risk of readmission - Experiencing homelessness - Patient has had 10 ED visits in March alone. - TOC consulted for resource assistance.   Debility Ambulatory dysfunction - Appears this is chronic.  Patient previously attending outpatient rehab.  Has previously been recommended for SNF but denied placement

## 2023-12-06 NOTE — Progress Notes (Signed)
 PROGRESS NOTE    Madeline Shaw  UEA:[id number] DOB: Apr 28, 1976 DOA: 12/04/2023 PCP: [facility]  Chief Complaint  Patient presents with   Seizures    Hospital Course:  Madeline Shaw is 48 y.o. female with seizure disorder, bipolar disorder, PTSD, polysubstance abuse, who was admitted to the hospital for concern of breakthrough seizures.  Patient reported seizures have been well-controlled.  She reports her primary care doctor recently added Keppra to Lamictal.  She denies any missed doses of her medications.  She does report that she has recently had nausea and has vomited a few times in the last few days but believes that she is kept her medicine down.  She reports that she has had 13 seizures over the last week.  She reports she regularly wakes up with her dog licking her face.  Workup in the ED including UDS and head CT was unremarkable.  She received some potassium for hypokalemia.  EDP discussed directly with neurology who recommended Lamictal and Keppra levels and to resume home meds. Patient was in the ED on 3/30 for possible seizure activity.  What was witnessed at that time was determined to be consistent with psychomotor agitation.  She was found to be having active hallucinations.  Psychiatry was consulted, determined she did not require inpatient admission.  Subjective: No acute events overnight.  On evaluation today patient is calm.  She answers questions inconsistently and provides vague responses. Later endorsed vague homicidal ideations to her RN without any intent or plan  Objective: Vitals:   12/05/23 0309 12/05/23 1232 12/05/23 2106 12/06/23 0436  BP: 123/78 138/64 130/76 108/68  Pulse: 83 81 83 83  Resp: 18 17 17 17   Temp: 98.3 F (36.8 C) 98.6 F (37 C) 98.1 F (36.7 C) 97.7 F (36.5 C)  TempSrc:  Oral    SpO2: 98% 94% 98% 98%  Weight:      Height:       No intake or output data in the 24 hours ending 12/06/23 1449 Filed Weights    12/04/23 1046  Weight: 50.8 kg    Examination: General exam: Appears calm and comfortable, NAD  Respiratory system: No work of breathing, symmetric chest wall expansion Cardiovascular system: S1 & S2 heard, RRR.  Gastrointestinal system: Abdomen is nondistended, soft and nontender.  Neuro: Alert and oriented. Extremities: Symmetric, expected ROM Skin: No rashes, lesions Psychiatry: Mood and affect congruent.  Calm.  Does exhibit some tangential thought process and requires prompting.  Not actively hallucinating.  Able to sustain conversation.  Assessment & Plan:  Principal Problem:   Seizure (HCC) Active Problems:   Bipolar affective disorder, depressed, severe (HCC)    Seizure disorder with complaint of breakthrough seizure - History of medication nonadherence - EDP discussed with neurology on-call who requests formal consultation only if patient has new seizure while admitted - Head CT unremarkable - Continue seizure precautions - Continue to monitor on telemetry - Continue Keppra and Lamictal - Keppra and Lamictal doses both appear to be supratherapeutic.  Have discussed with pharmacology.  Holding Lamictal for 2 days to allow some clearance. Then resume Lamictal at 250 twice daily.  Appears patient has been taking 500 twice daily but prescription history indicates 250mg  prescribed --If seizure recurs will consult neurology  Bipolar disorder PTSD - Will resume home meds - Patient is intermittently hallucinating though mains calm and redirectable.  She is also endorsing homicidal ideation to her RN. - Behavioral health team consulted, and currently  recommending inpatient psychiatric hospitalization.  She is medically cleared to do this.  Patient is amendable to this plan and will proceed with voluntary admission.  Psychiatry has recommended IVC if patient attempts to leave the hospital. - Continue with as needed Haldol.  Normocytic anemia - Hemoglobin above baseline. -  Continue to monitor  History of polysubstance abuse - UDS negative on arrival  High risk of readmission - Experiencing homelessness - Patient has had 10 ED visits in March alone. - TOC consult for resource assistance.  Debility Ambulatory dysfunction - Appears this is chronic.  Patient previously attending outpatient rehab.  Has previously been recommended for SNF but denied placement - Repeat PT evals pending  DVT prophylaxis: SCDs avoid Lovenox for now given patient's recent seizures and propensity to hit her head.   Code Status: Full Code Family Communication:  Discussed directly with patient Disposition: Medically cleared for discharge to inpatient psych when bed has been arranged.  At this time she is voluntary admission but psychiatry has requested IVC if she attempts to leave the hospital  Consultants:    Procedures:    Antimicrobials:  Anti-infectives (From admission, onward)    None       Data Reviewed: I have personally reviewed following labs and imaging studies CBC: Recent Labs  Lab 11/30/23 1446 12/04/23 1037 12/05/23 0428  WBC 4.6 4.8 4.4  NEUTROABS 3.1 3.2  --   HGB 11.8* 13.8 11.8*  HCT 36.6 42.1 35.5*  MCV 92.2 90.0 90.3  PLT 233 254 217   Basic Metabolic Panel: Recent Labs  Lab 11/30/23 1446 12/04/23 1037 12/05/23 0428  NA 140 140 140  K 3.8 3.1* 3.6  CL 103 101 105  CO2 27 27 23   GLUCOSE 117* 105* 96  BUN 13 13 14   CREATININE 0.99 0.98 0.91  CALCIUM 9.5 10.2 9.3   GFR: Estimated Creatinine Clearance: 58.5 mL/min (by C-G formula based on SCr of 0.91 mg/dL). Liver Function Tests: Recent Labs  Lab 11/30/23 1446 12/04/23 1037  AST 26 26  ALT 24 23  ALKPHOS 71 84  BILITOT 0.4 0.6  PROT 7.3 8.2*  ALBUMIN 4.2 4.9   CBG: Recent Labs  Lab 11/30/23 1443  GLUCAP 115*    No results found for this or any previous visit (from the past 240 hours).   Radiology Studies: CT Head Wo Contrast Result Date: 12/04/2023 CLINICAL DATA:   Head trauma. EXAM: CT HEAD WITHOUT CONTRAST TECHNIQUE: Contiguous axial images were obtained from the base of the skull through the vertex without intravenous contrast. RADIATION DOSE REDUCTION: This exam was performed according to the departmental dose-optimization program which includes automated exposure control, adjustment of the mA and/or kV according to patient size and/or use of iterative reconstruction technique. COMPARISON:  Head CT dated 11/20/2023. FINDINGS: Brain: The ventricles and sulci are appropriate size for the patient's age. The gray-white matter discrimination is preserved. There is no acute intracranial hemorrhage. No mass effect or midline shift. No extra-axial fluid collection. Vascular: No hyperdense vessel or unexpected calcification. Skull: Normal. Negative for fracture or focal lesion. Sinuses/Orbits: No acute finding. Other: None IMPRESSION: No acute intracranial pathology. Electronically Signed   By: Elgie Collard M.D.   On: 12/04/2023 15:54    Scheduled Meds:  fluvoxaMINE  150 mg Oral QHS   gabapentin  200 mg Oral TID   haloperidol lactate  5 mg Intravenous Q8H   [START ON 12/08/2023] lamoTRIgine  250 mg Oral Daily   levETIRAcetam  1,500 mg Oral  BID   Continuous Infusions:   LOS: 1 day  MDM: Patient is high risk for one or more organ failure.  They necessitate ongoing hospitalization for continued IV therapies and subsequent lab monitoring. Total time spent interpreting labs and vitals, coordinating care amongst consultants and care team members, directly assessing and discussing care with the patient and/or family: 55 min    Debarah Crape, DO [facility]  To contact the attending physician between 7A-7P please use Epic Chat. To contact the covering physician during after hours 7P-7A, please review Amion.   12/06/2023, 2:49 PM   *This document has been created with the assistance of dictation software. Please excuse typographical errors. *

## 2023-12-06 NOTE — Progress Notes (Signed)
   12/06/23 2133  Psych Admission Type (Psych Patients Only)  Admission Status Voluntary  Psychosocial Assessment  Patient Complaints Self-harm thoughts;Anxiety;Depression  Eye Contact Fair  Facial Expression Sad;Anxious  Affect Depressed  Speech Logical/coherent  Interaction Cautious;Forwards little  Motor Activity Unsteady  Appearance/Hygiene Disheveled;Poor Media planner Cooperative;Appropriate to situation  Mood Depressed;Anxious  Thought Process  Coherency WDL  Content WDL  Delusions None reported or observed  Perception WDL  Hallucination None reported or observed  Judgment Poor  Confusion None  Danger to Self  Current suicidal ideation? Passive  Agreement Not to Harm Self Yes  Description of Agreement verbal  Danger to Others  Danger to Others Reported or observed  Danger to Others Abnormal  Harmful Behavior to others Threats of violence towards other people observed or expressed   Description of Harmful Behavior patient reports HI towards "[name]"  Destructive Behavior No threats or harm toward property

## 2023-12-06 NOTE — Plan of Care (Signed)
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
## 2023-12-06 NOTE — Progress Notes (Signed)
 PT Cancellation Note  Patient Details Name: Madeline Shaw MRN: [id number] DOB: Dec 07, 1975   Cancelled Treatment:    Reason Eval/Treat Not Completed: Medical issues which prohibited therapy (RN requested PT hold for now, Pt just had haldol and is lethargic. Will follow.)  Tamala Ser PT 12/06/2023  Acute Rehabilitation Services  Office [phone]

## 2023-12-07 DIAGNOSIS — F314 Bipolar disorder, current episode depressed, severe, without psychotic features: Secondary | ICD-10-CM | POA: Diagnosis not present

## 2023-12-07 DIAGNOSIS — F4310 Post-traumatic stress disorder, unspecified: Secondary | ICD-10-CM | POA: Insufficient documentation

## 2023-12-07 MED ORDER — GABAPENTIN 100 MG PO CAPS
200.0000 mg | ORAL_CAPSULE | Freq: Three times a day (TID) | ORAL | Status: DC
  Filled 2023-12-07: qty 2

## 2023-12-07 MED ORDER — GABAPENTIN 300 MG PO CAPS
300.0000 mg | ORAL_CAPSULE | Freq: Three times a day (TID) | ORAL | Status: DC
  Administered 2023-12-08 – 2023-12-11 (×11): 300 mg via ORAL
  Filled 2023-12-07 (×15): qty 1

## 2023-12-07 MED ORDER — MIRTAZAPINE 15 MG PO TABS
15.0000 mg | ORAL_TABLET | Freq: Every day | ORAL | Status: DC
  Administered 2023-12-07 – 2023-12-08 (×2): 15 mg via ORAL
  Filled 2023-12-07 (×5): qty 1

## 2023-12-07 MED ORDER — GABAPENTIN 300 MG PO CAPS
300.0000 mg | ORAL_CAPSULE | Freq: Three times a day (TID) | ORAL | Status: DC
  Filled 2023-12-07: qty 1

## 2023-12-07 MED ORDER — LURASIDONE HCL 20 MG PO TABS
20.0000 mg | ORAL_TABLET | Freq: Every day | ORAL | Status: DC
  Administered 2023-12-08 – 2023-12-09 (×2): 20 mg via ORAL
  Filled 2023-12-07 (×4): qty 1

## 2023-12-07 MED ORDER — LAMOTRIGINE ER 50 MG PO TB24
250.0000 mg | ORAL_TABLET | Freq: Every day | ORAL | Status: DC
  Administered 2023-12-07 – 2023-12-08 (×2): 250 mg via ORAL
  Filled 2023-12-07 (×5): qty 5

## 2023-12-07 MED ORDER — ENSURE ENLIVE PO LIQD
237.0000 mL | Freq: Two times a day (BID) | ORAL | Status: DC
  Administered 2023-12-07 – 2023-12-11 (×7): 237 mL via ORAL
  Filled 2023-12-07 (×11): qty 237

## 2023-12-07 NOTE — Group Note (Signed)
 Date:  12/07/2023 Time:  9:13 PM  Group Topic/Focus:  Wrap-Up Group:   The focus of this group is to help patients review their daily goal of treatment and discuss progress on daily workbooks.    Participation Level:  Did Not Attend  Participation Quality:  n/a  Affect:  n/a  Cognitive:  n/a  Insight: None  Engagement in Group:  n/a  Modes of Intervention:  n/a  Additional Comments:   Pt was encouraged but refused to attend wrap up group  Vevelyn Pat 12/07/2023, 9:13 PM

## 2023-12-07 NOTE — Plan of Care (Signed)
  Problem: Education: Goal: Emotional status will improve Outcome: Progressing Goal: Mental status will improve Outcome: Progressing   Problem: Coping: Goal: Ability to verbalize frustrations and anger appropriately will improve Outcome: Progressing   
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
## 2023-12-07 NOTE — [facility] Group Notes (Signed)
 [facility] Group Notes:  (Nursing/MHT/Case Management/Adjunct)  Date:  12/07/2023  Time:  10:04 AM  Type of Therapy:  Psychoeducational Skills  Participation Level:  Did Not Attend    Audrie Lia Tassie Pollett 12/07/2023, 10:04 AM
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
## 2023-12-07 NOTE — Progress Notes (Signed)
 Dar Note: Patient presents with a flat affect and depressed mood.  Denies suicidal thoughts, auditory and visual hallucinations.  Medications given as prescribed.  Patient in her room for majority of this shift resting.  No acute distress noted.  No seizure activities noted or reported.  Routine safety checks maintained.  Food and fluid intake encouraged.  Patient is safe on the unit.

## 2023-12-07 NOTE — [facility] Group Notes (Signed)
 Adult Psychoeducational Group Note  Date:  12/07/2023 Time:  1:11 PM  Group Topic/Focus:  Goals Group:   The focus of this group is to help patients establish daily goals to achieve during treatment and discuss how the patient can incorporate goal setting into their daily lives to aide in recovery. Orientation:   The focus of this group is to educate the patient on the purpose and policies of crisis stabilization and provide a format to answer questions about their admission.  The group details unit policies and expectations of patients while admitted.  Participation Level:  Did Not Attend  Participation Quality:    Affect:    Cognitive:    Insight:   Engagement in Group:    Modes of Intervention:    Additional Comments:    Sheran Lawless 12/07/2023, 1:11 PM
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
## 2023-12-08 ENCOUNTER — Encounter (HOSPITAL_COMMUNITY): Payer: Self-pay

## 2023-12-08 DIAGNOSIS — F314 Bipolar disorder, current episode depressed, severe, without psychotic features: Secondary | ICD-10-CM | POA: Diagnosis not present

## 2023-12-08 LAB — GLUCOSE, CAPILLARY: Glucose-Capillary: 114 mg/dL — ABNORMAL HIGH (ref 70–99)

## 2023-12-08 MED ORDER — LAMOTRIGINE ER 100 MG PO TB24
200.0000 mg | ORAL_TABLET | Freq: Every day | ORAL | Status: DC
  Administered 2023-12-09 – 2023-12-11 (×3): 200 mg via ORAL
  Filled 2023-12-08 (×5): qty 2

## 2023-12-08 MED ORDER — ESCITALOPRAM OXALATE 20 MG PO TABS
20.0000 mg | ORAL_TABLET | Freq: Every day | ORAL | Status: DC
Start: 2023-12-12 — End: 2023-12-14
  Filled 2023-12-08: qty 1

## 2023-12-08 MED ORDER — ESCITALOPRAM OXALATE 10 MG PO TABS
10.0000 mg | ORAL_TABLET | Freq: Every day | ORAL | Status: AC
  Administered 2023-12-10 – 2023-12-11 (×2): 10 mg via ORAL
  Filled 2023-12-08 (×3): qty 1

## 2023-12-08 MED ORDER — FLUVOXAMINE MALEATE 50 MG PO TABS
50.0000 mg | ORAL_TABLET | Freq: Every day | ORAL | Status: DC
  Administered 2023-12-10: 50 mg via ORAL
  Filled 2023-12-08 (×3): qty 1

## 2023-12-08 MED ORDER — FLUVOXAMINE MALEATE 100 MG PO TABS
100.0000 mg | ORAL_TABLET | Freq: Every day | ORAL | Status: AC
  Administered 2023-12-08: 100 mg via ORAL
  Administered 2023-12-09: 50 mg via ORAL
  Filled 2023-12-08 (×2): qty 1

## 2023-12-08 NOTE — BH IP Treatment Plan (Signed)
 Interdisciplinary Treatment and Diagnostic Plan Update  12/08/2023 Time of Session: 1103 Madeline Shaw MRN: [id number]  Principal Diagnosis: Bipolar affective disorder, depressed, severe (HCC)  Secondary Diagnoses: Principal Problem:   Bipolar affective disorder, depressed, severe (HCC) Active Problems:   Suicidal ideation   Seizure (HCC)   PTSD (post-traumatic stress disorder)   Current Medications:  Current Facility-Administered Medications  Medication Dose Route Frequency Provider Last Rate Last Admin   albuterol (PROVENTIL) (2.5 MG/3ML) 0.083% nebulizer solution 2.5 mg  2.5 mg Nebulization Q2H PRN Howie Ill, NP       alum & mag hydroxide-simeth (MAALOX/MYLANTA) 200-200-20 MG/5ML suspension 30 mL  30 mL Oral Q4H PRN Howie Ill, NP       [START ON 12/10/2023] escitalopram (LEXAPRO) tablet 10 mg  10 mg Oral Daily Abbott Pao, Nadir, MD       Followed by   Melene Muller ON 12/12/2023] escitalopram (LEXAPRO) tablet 20 mg  20 mg Oral Daily Attiah, Nadir, MD       feeding supplement (ENSURE ENLIVE / ENSURE PLUS) liquid 237 mL  237 mL Oral BID BM Attiah, Nadir, MD   237 mL at 12/08/23 1549   fluvoxaMINE (LUVOX) tablet 100 mg  100 mg Oral QHS Attiah, Nadir, MD       Followed by   Melene Muller ON 12/10/2023] fluvoxaMINE (LUVOX) tablet 50 mg  50 mg Oral QHS Attiah, Nadir, MD       gabapentin (NEURONTIN) capsule 300 mg  300 mg Oral TID Park Pope, MD   300 mg at 12/08/23 1627   haloperidol (HALDOL) tablet 5 mg  5 mg Oral Q8H PRN Howie Ill, NP       hydrOXYzine (ATARAX) tablet 50 mg  50 mg Oral TID PRN Howie Ill, NP   50 mg at 12/08/23 1411   ibuprofen (ADVIL) tablet 400 mg  400 mg Oral Q6H PRN Howie Ill, NP       [START ON 12/09/2023] lamoTRIgine (LAMICTAL XR) 24 hour tablet 200 mg  200 mg Oral Daily Attiah, Nadir, MD       levETIRAcetam (KEPPRA) tablet 1,500 mg  1,500 mg Oral BID Abbott Pao, Nadir, MD   1,500 mg at 12/08/23 1627   lurasidone (LATUDA) tablet 20 mg  20 mg Oral Q breakfast  Park Pope, MD   20 mg at 12/08/23 4098   magnesium hydroxide (MILK OF MAGNESIA) suspension 30 mL  30 mL Oral Daily PRN Howie Ill, NP       mirtazapine (REMERON) tablet 15 mg  15 mg Oral Seabron Spates, MD   15 mg at 12/07/23 2039   nicotine (NICODERM CQ - dosed in mg/24 hours) patch 14 mg  14 mg Transdermal Daily Bobbitt, Shalon E, NP   14 mg at 12/08/23 1191   PTA Medications: Medications Prior to Admission  Medication Sig Dispense Refill Last Dose/Taking   cyanocobalamin 1000 MCG tablet Take 1 tablet (1,000 mcg total) by mouth daily. 30 tablet 0    ferrous sulfate 325 (65 FE) MG tablet Take 325 mg by mouth daily with breakfast.      fluvoxaMINE (LUVOX) 50 MG tablet Take 3 tablets (150 mg total) by mouth at bedtime. 90 tablet 0    folic acid (FOLVITE) 1 MG tablet Take 1 tablet (1 mg total) by mouth daily. 30 tablet 0    gabapentin (NEURONTIN) 100 MG capsule Take 2 capsules (200 mg total) by mouth 3 (three) times daily. (Patient taking differently: Take 300 mg  by mouth 3 (three) times daily.) 180 capsule 0    hydrOXYzine (VISTARIL) 100 MG capsule Take 200 mg by mouth 4 (four) times daily.      LamoTRIgine 250 MG TB24 24 hour tablet Take 1 tablet (250 mg total) by mouth daily.      levETIRAcetam (KEPPRA) 750 MG tablet Take 2 tablets (1,500 mg total) by mouth 2 (two) times daily.      Multiple Vitamin (MULTIVITAMIN) tablet Take 1 tablet by mouth daily with breakfast.      VENTOLIN HFA 108 (90 Base) MCG/ACT inhaler Inhale 2 puffs into the lungs every 6 (six) hours as needed for wheezing or shortness of breath.       Patient Stressors: Financial difficulties   Health problems    Patient Strengths: Motivation for treatment/growth  Supportive family/friends   Treatment Modalities: Medication Management, Group therapy, Case management,  1 to 1 session with clinician, Psychoeducation, Recreational therapy.   Physician Treatment Plan for Primary Diagnosis: Bipolar affective disorder,  depressed, severe (HCC) Long Term Goal(s): Improvement in symptoms so as ready for discharge   Short Term Goals: Ability to identify changes in lifestyle to reduce recurrence of condition will improve Ability to verbalize feelings will improve Ability to disclose and discuss suicidal ideas Ability to demonstrate self-control will improve  Medication Management: Evaluate patient's response, side effects, and tolerance of medication regimen.  Therapeutic Interventions: 1 to 1 sessions, Unit Group sessions and Medication administration.  Evaluation of Outcomes: Not Progressing  Physician Treatment Plan for Secondary Diagnosis: Principal Problem:   Bipolar affective disorder, depressed, severe (HCC) Active Problems:   Suicidal ideation   Seizure (HCC)   PTSD (post-traumatic stress disorder)  Long Term Goal(s): Improvement in symptoms so as ready for discharge   Short Term Goals: Ability to identify changes in lifestyle to reduce recurrence of condition will improve Ability to verbalize feelings will improve Ability to disclose and discuss suicidal ideas Ability to demonstrate self-control will improve     Medication Management: Evaluate patient's response, side effects, and tolerance of medication regimen.  Therapeutic Interventions: 1 to 1 sessions, Unit Group sessions and Medication administration.  Evaluation of Outcomes: Not Progressing   RN Treatment Plan for Primary Diagnosis: Bipolar affective disorder, depressed, severe (HCC) Long Term Goal(s): Knowledge of disease and therapeutic regimen to maintain health will improve  Short Term Goals: Ability to remain free from injury will improve, Ability to demonstrate self-control, Ability to verbalize feelings will improve, and Ability to disclose and discuss suicidal ideas  Medication Management: RN will administer medications as ordered by provider, will assess and evaluate patient's response and provide education to patient for  prescribed medication. RN will report any adverse and/or side effects to prescribing provider.  Therapeutic Interventions: 1 on 1 counseling sessions, Psychoeducation, Medication administration, Evaluate responses to treatment, Monitor vital signs and CBGs as ordered, Perform/monitor CIWA, COWS, AIMS and Fall Risk screenings as ordered, Perform wound care treatments as ordered.  Evaluation of Outcomes: Not Progressing   LCSW Treatment Plan for Primary Diagnosis: Bipolar affective disorder, depressed, severe (HCC) Long Term Goal(s): Safe transition to appropriate next level of care at discharge, Engage patient in therapeutic group addressing interpersonal concerns.  Short Term Goals: Engage patient in aftercare planning with referrals and resources, Increase social support, Increase ability to appropriately verbalize feelings, Increase emotional regulation, and Increase skills for wellness and recovery  Therapeutic Interventions: Assess for all discharge needs, 1 to 1 time with Social worker, Explore available resources and support systems,  Assess for adequacy in community support network, Educate family and significant other(s) on suicide prevention, Complete Psychosocial Assessment, Interpersonal group therapy.  Evaluation of Outcomes: Not Progressing   Progress in Treatment: Attending groups: No. Participating in groups: No. Taking medication as prescribed: Yes. Toleration medication: Yes. Family/Significant other contact made: Yes, individual(s) contacted:  son, Madeline Shaw Patient understands diagnosis: Yes. Discussing patient identified problems/goals with staff: Yes. Medical problems stabilized or resolved: Yes. Denies suicidal/homicidal ideation: Yes. Issues/concerns per patient self-inventory: No. Other: n/a  New problem(s) identified: No, Describe:  none  New Short Term/Long Term Goal(s): medication stabilization, elimination of SI thoughts, development of comprehensive  mental wellness plan.    Patient Goals:  "Outside of my head and coping better"  Discharge Plan or Barriers: Patient recently admitted. CSW will continue to follow and assess for appropriate referrals and possible discharge planning.    Reason for Continuation of Hospitalization: Anxiety Medication stabilization Other; describe Mood stabilization, discharge planning  Estimated Length of Stay: 3-5 DAYS  Last 3 [place] Suicide Severity Risk Score: Flowsheet Row [facility] (Current[facility]-Admission (Discharged) from 12/04/2023 in [place] [facility] from 11/30/2023 in [facility]  C-SSRS RISK CATEGORY Low Risk No Risk No Risk       Last PHQ 2/9 Scores:     No data to display          Scribe for Treatment Team: Jacinta Shoe, [place] 12/08/2023 6:56 PM

## 2023-12-08 NOTE — Progress Notes (Signed)
 Recreation Therapy Notes  INPATIENT RECREATION THERAPY ASSESSMENT  Patient Details Name: Madeline Shaw MRN: [id number] DOB: 08-Feb-1976 Today's Date: 12/08/2023       Information Obtained From: Patient  Able to Participate in Assessment/Interview: Yes  Patient Presentation: Alert (Soft spoken)  Reason for Admission (Per Patient): Other (Comments) (Nightmares, feelings not good about ex)  Patient Stressors: Other (Comment) ("life", "get meds right")  Coping Skills:   Isolation, Self-Injury, Journal, TV, Music, Exercise, Impulsivity, Talk, Art, Prayer, Avoidance, Read  Leisure Interests (2+):  Art - Coloring, Individual - Reading, Individual - Journaling, Individual - TV, Exercise - Walking, Individual - Other (Comment) (Play with puppy)  Frequency of Recreation/Participation: Other (Comment) (Daily)  Awareness of Community Resources:  Yes  Community Resources:  Library, Public affairs consultant, [employer]  Current Use: Yes  If no, Barriers?:    Expressed Interest in [employer] Information: No  [employer] of Residence:  Arts administrator  Patient Main Form of Transportation: Other (Comment) ("other people")  Patient Strengths:  "good motivated person, empathetic to others and fun to be around"  Patient Identified Areas of Improvement:  taking care of self  Patient Goal for Hospitalization:  "learn not to be hard on myself, have me time and learn to say no"  Current SI (including self-harm):  No  Current HI:  Yes (thoughts towards ex-husband; Contracts for safety)  Current AVH: No  Staff Intervention Plan:    Consent to Intern Participation: N/A   Saahil Herbster-McCall, LRT,CTRS Rustyn Conery A Milliani Herrada-McCall 12/08/2023, 1:38 PM

## 2023-12-08 NOTE — Group Note (Signed)
 Date:  12/08/2023 Time:  9:56 AM  Group Topic/Focus:  Goals Group:   The focus of this group is to help patients establish daily goals to achieve during treatment and discuss how the patient can incorporate goal setting into their daily lives to aide in recovery.    Participation Level:  Did Not Attend  Madeline Shaw 12/08/2023, 9:56 AM

## 2023-12-08 NOTE — Group Note (Unsigned)
 Date:  12/08/2023 Time:  8:41 PM  Group Topic/Focus:  Wrap-Up Group:   The focus of this group is to help patients review their daily goal of treatment and discuss progress on daily workbooks.     Participation Level:  {BHH PARTICIPATION MWUXL:24401}  Participation Quality:  {B[facility] PARTICIPATION QUALITY:22265}  Affect:  {BHH AFFECT:22266}  Cognitive:  {BHH COGNITIVE:22267}  Insight: {BHH Insight2:20797}  Engagement in Group:  {BHH ENGAGEMENT IN UUVOZ:36644}  Modes of Intervention:  {BHH MODES OF INTERVENTION:22269}  Additional Comments:  ***  Scot Dock 12/08/2023, 8:41 PM

## 2023-12-08 NOTE — Plan of Care (Signed)
  Problem: Education: Goal: Emotional status will improve Outcome: Progressing   Problem: Safety: Goal: Periods of time without injury will increase Outcome: Progressing   

## 2023-12-08 NOTE — Plan of Care (Signed)
   Problem: Education: Goal: Emotional status will improve Outcome: Progressing Goal: Mental status will improve Outcome: Progressing   Problem: Activity: Goal: Interest or engagement in activities will improve Outcome: Progressing Goal: Sleeping patterns will improve Outcome: Progressing

## 2023-12-08 NOTE — Group Note (Signed)
 LCSW Group Therapy Note   Group Date: 12/08/2023 Start Time: 2:00 PM End Time: 1:00 PM   Participation:  did not attend  Type of Therapy:  Group Therapy  Title:  "Shining from Within: Confidence and Self-Love Journey"  Objective:  The focus of today's session is to explore how confidence and self-love can be nurtured over time through self-compassion, recognizing strengths, and taking small, intentional steps.  Goals: Foster self-love and acceptance by embracing strengths and imperfections. Develop confidence through actionable steps and mindset shifts. Practice patience during personal growth, acknowledging setbacks as part of the journey.  Summary:  This session focused on self-love as the foundation for confidence.  Participants practiced helpful self-talk, identified strengths, set small goals, and reflected on achievements and social support.  It emphasized that building confidence is a continuous process requiring patience and self-care.  Therapeutic Modalities: Cognitive Behavioral Therapy (CBT): Used to challenge and replace unhelpful self-talk with more supportive thoughts, enhancing self-esteem. Mindfulness and Self-Compassion Practices: Encourages reflection on strengths, gratitude, and the creation of a positive environment to foster a sense of well-being.   Alla Feeling, LCSWA 12/08/2023  5:48 PM

## 2023-12-08 NOTE — Progress Notes (Signed)
 Collateral contact - Eleasha Cataldo (son) [phone]  Son said [place] and is in and out of hospitals.    Son said that he spoke with patient on Saturday and he believed that she was hallucinating (she told him that one of the nurses had a dog there).  He said that prior to that he hasn't spoken with her for 2 months.  He said that she pushes away family (siblings) and friends because she is argumentative and manipulates people.    Son said that he will not be able to visit her because he works 12-hour shifts, and the hospital is 45 minutes away.  He said that he is off on Wednesday (4/8) and Thursday (4/9) so he will be available to speak on the phone if anyone  (for example, doctor) has any questions for him.     Kamylle Axelson, LCSWA

## 2023-12-08 NOTE — Progress Notes (Signed)
   12/08/23 0814  Psych Admission Type (Psych Patients Only)  Admission Status Voluntary  Psychosocial Assessment  Patient Complaints Anxiety;Depression  Eye Contact Fair  Facial Expression Animated  Affect Appropriate to circumstance  Speech Soft  Interaction Assertive  Motor Activity Slow  Appearance/Hygiene Unremarkable  Behavior Characteristics Cooperative  Mood Pleasant  Thought Process  Coherency WDL  Content WDL  Delusions None reported or observed  Perception WDL  Hallucination None reported or observed  Judgment Poor  Confusion None  Danger to Self  Current suicidal ideation? Denies  Agreement Not to Harm Self Yes  Description of Agreement verbal  Danger to Others  Danger to Others None reported or observed  Danger to Others Abnormal  Harmful Behavior to others No threats or harm toward other people  Destructive Behavior No threats or harm toward property

## 2023-12-08 NOTE — Group Note (Signed)
 Date:  12/08/2023 Time:  8:55 PM  Group Topic/Focus:  Wrap-Up Group:   The focus of this group is to help patients review their daily goal of treatment and discuss progress on daily workbooks.    Participation Level:  Active  Participation Quality:  Appropriate  Affect:  Appropriate  Cognitive:  Appropriate  Insight: Appropriate  Engagement in Group:  Engaged  Modes of Intervention:  Discussion  Additional Comments:  Pt states that she's had a good day and has finally been able to eat more than normal. Pt states she spoke with her CSW and other members of her care team. Pt does not have any issues to report to her doctor. Pt endorsed anxiety from being in the dayroom and spoke about some pain in her face that she was experiencing.   Madeline Shaw 12/08/2023, 8:55 PM

## 2023-12-08 NOTE — Progress Notes (Signed)
   12/08/23 2145  Psych Admission Type (Psych Patients Only)  Admission Status Voluntary  Psychosocial Assessment  Patient Complaints Anxiety;Depression  Eye Contact Fair  Facial Expression Animated  Affect Appropriate to circumstance  Speech Logical/coherent  Interaction Assertive  Motor Activity Slow  Appearance/Hygiene Unremarkable  Behavior Characteristics Cooperative  Mood Pleasant  Aggressive Behavior  Effect No apparent injury  Thought Process  Coherency WDL  Content WDL  Delusions WDL  Perception WDL  Hallucination None reported or observed  Judgment WDL  Confusion WDL  Danger to Self  Current suicidal ideation? Denies  Danger to Others  Danger to Others None reported or observed  Danger to Others Abnormal  Harmful Behavior to others No threats or harm toward other people

## 2023-12-08 NOTE — Progress Notes (Signed)
 [facility] MD Progress Note  12/08/2023 1:03 PM Madeline Shaw  MRN:  [id number]   Reason for Admission:  Madeline Shaw is a 48 y.o., female with history of type I bipolar disorder, PTSD, generalized anxiety disorder, and seizure disorder admitted voluntarily to behavioral health hospital on 12/06/2023 for management of longstanding HI towards ex, worsening depression and poorly controlled bipolar disorder.  Last psychiatric admission within 2019 for depression and suicidal ideation. The patient is currently on Hospital Day 2.   Chart Review from last 24 hours:  The patient's chart was reviewed and nursing notes were reviewed. The patient's case was discussed in multidisciplinary team meeting. Per [facility] patient is compliant with her medications on the unit, no as needed medication needed or given for anxiety or agitation.  Blood pressure yesterday morning 108/78, this morning 87/67 but patient was reported by staff and during evaluation to be asymptomatic.  Information Obtained Today During Patient Interview: The patient was seen and evaluated on the unit. On assessment today the patient reports poor sleep last night being off her prazosin, I discussed with her her low blood pressure and need to push p.o. fluids and will monitor, I discussed with her restarting prazosin at a low dose 1 mg at bedtime to help with nightmares and sleep.  She reports decreased appetite, discussed will follow given on Remeron already.  She continues to report depressed mood and anxiety, she denies passive or active SI intention or plan, she continues to report vague HI toward her ex-husband but no specific plan at this time but she had intention and plan prior to admission.  She denies AVH denies paranoia or other delusions, does not present responding to stimuli or psychotic.  She is using walker steadily with no falls noted or reported, she uses walker at home.  She denies symptoms consistent with mania or hypomania.  Patient  reports her depression in the past responded better to Lexapro, I discussed with her switching Luvox gradually to Lexapro to avoid any withdrawals, she agrees with the plan.  I also recommended her to utilize as needed Vistaril for anxiety, will follow.   Sleep  poor interrupted sleep reported with nightmares  Principal Problem: Bipolar affective disorder, depressed, severe (HCC) Diagnosis: Principal Problem:   Bipolar affective disorder, depressed, severe (HCC) Active Problems:   Suicidal ideation   Seizure (HCC)   PTSD (post-traumatic stress disorder)    Past Psychiatric History: See H&P  Past Medical History:  Past Medical History:  Diagnosis Date   Anxiety    Cancer (HCC)    Depression    Gall stones    Seizures (HCC)    Stroke (HCC)    Vertigo     Past Surgical History:  Procedure Laterality Date   ABDOMINAL HYSTERECTOMY     Family History: History reviewed. No pertinent family history. Family Psychiatric  History: See H&P Social History: See H&P  Current Medications: Current Facility-Administered Medications  Medication Dose Route Frequency Provider Last Rate Last Admin   albuterol (PROVENTIL) (2.5 MG/3ML) 0.083% nebulizer solution 2.5 mg  2.5 mg Nebulization Q2H PRN Howie Ill, NP       alum & mag hydroxide-simeth (MAALOX/MYLANTA) 200-200-20 MG/5ML suspension 30 mL  30 mL Oral Q4H PRN Howie Ill, NP       feeding supplement (ENSURE ENLIVE / ENSURE PLUS) liquid 237 mL  237 mL Oral BID BM Versie Fleener, MD   237 mL at 12/07/23 1123   fluvoxaMINE (LUVOX) tablet 150 mg  150 mg Oral QHS Gaylyn Rong C, NP   150 mg at 12/07/23 2038   gabapentin (NEURONTIN) capsule 300 mg  300 mg Oral TID Park Pope, MD   300 mg at 12/08/23 1152   haloperidol (HALDOL) tablet 5 mg  5 mg Oral Q8H PRN Howie Ill, NP       hydrOXYzine (ATARAX) tablet 50 mg  50 mg Oral TID PRN Howie Ill, NP       ibuprofen (ADVIL) tablet 400 mg  400 mg Oral Q6H PRN Howie Ill, NP        lamoTRIgine (LAMICTAL XR) 24 hour tablet 250 mg  250 mg Oral Daily Park Pope, MD   250 mg at 12/08/23 0814   levETIRAcetam (KEPPRA) tablet 1,500 mg  1,500 mg Oral BID Abbott Pao, Cosette Prindle, MD   1,500 mg at 12/08/23 8657   lurasidone (LATUDA) tablet 20 mg  20 mg Oral Q breakfast Park Pope, MD   20 mg at 12/08/23 8469   magnesium hydroxide (MILK OF MAGNESIA) suspension 30 mL  30 mL Oral Daily PRN Howie Ill, NP       mirtazapine (REMERON) tablet 15 mg  15 mg Oral Seabron Spates, MD   15 mg at 12/07/23 2039   nicotine (NICODERM CQ - dosed in mg/24 hours) patch 14 mg  14 mg Transdermal Daily Bobbitt, Shalon E, NP   14 mg at 12/08/23 6295    Lab Results: No results found for this or any previous visit (from the past 48 hours).  Blood Alcohol level:  Lab Results  Component Value Date   ETH <10 11/30/2023   ETH <10 03/26/2023    Metabolic Disorder Labs: Lab Results  Component Value Date   HGBA1C 4.9 11/22/2023   MPG 93.93 11/22/2023   MPG 93.93 11/21/2023   No results found for: "PROLACTIN" Lab Results  Component Value Date   CHOL 160 11/22/2023   TRIG 151 (H) 11/22/2023   HDL 34 (L) 11/22/2023   CHOLHDL 4.7 11/22/2023   VLDL 30 11/22/2023   LDLCALC 96 11/22/2023   LDLCALC 95 11/21/2023    Physical Findings: AIMS: Facial and Oral Movements Muscles of Facial Expression: None Lips and Perioral Area: None Jaw: None Tongue: None,Extremity Movements Upper (arms, wrists, hands, fingers): None Lower (legs, knees, ankles, toes): None, Trunk Movements Neck, shoulders, hips: None, Global Judgements Severity of abnormal movements overall : None Incapacitation due to abnormal movements: None Patient's awareness of abnormal movements: No Awareness, Dental Status Current problems with teeth and/or dentures?: No Does patient usually wear dentures?: No Edentia?: No  CIWA:    COWS:     Musculoskeletal: Strength & Muscle Tone: within normal limits Gait & Station: normal Patient  leans: N/A  Psychiatric Specialty Exam:  General Appearance: Casually dressed and groomed, using walker steadily, appears older than stated age  Behavior: Guarded mildly but pleasant and calm in general  Psychomotor Activity:No psychomotor agitation but moderate retardation noted  Eye Contact: Limited Speech: Decreased amount, decreased tone and volume   Mood: Moderately dysphoric Affect: Restricted to flat sad affect  Thought Process: Linear and goal-directed Descriptions of Associations: Intact concrete Thought Content: Hallucinations: Denies AH, VH, does not appear responding to stimuli Delusions: No paranoia or other delusions noted Suicidal Thoughts: Denies passive or active SI, intention, plan  Homicidal Thoughts: Reports vague HI, but denies specific intention, plan   Alertness/Orientation: Alert and oriented  Insight: Limited Judgment: Limited  Memory: Limited  Executive Functions  Concentration:  Fair Attention Span: Fair Recall: [employer] of Knowledge: Fair   Assets  Assets: Manufacturing systems engineer; Desire for Improvement; Leisure Time    Physical Exam: Physical Exam ROS Blood pressure (!) 87/67, pulse 91, temperature 98.1 F (36.7 C), temperature source Oral, resp. rate 16, height 5\' 2"  (1.575 m), weight 45.4 kg, SpO2 100%. Body mass index is 18.29 kg/m.   Treatment Plan Summary: Daily contact with patient to assess and evaluate symptoms and progress in treatment and Medication management  ASSESSMENT:  Diagnoses / Active Problems: Principal Problem: Bipolar affective disorder, depressed, severe (HCC) Diagnosis: Principal Problem:   Bipolar affective disorder, depressed, severe (HCC) Active Problems:   Suicidal ideation   Seizure (HCC)   PTSD (post-traumatic stress disorder)   PLAN: Safety and Monitoring:  -- Voluntary admission to inpatient psychiatric unit for safety, stabilization and treatment  -- Daily contact with patient to assess  and evaluate symptoms and progress in treatment  -- Patient's case to be discussed in multi-disciplinary team meeting  -- Observation Level : q15 minute checks  -- Vital signs:  q12 hours  -- Precautions: suicide, elopement, and assault  2. Medications:   Continue gabapentin 300 mg 3 times daily for chronic pain, also will help with mood and anxiety  Decreased Lamictal XR from 250 to 200 mg mg daily for mood and depression, also for seizure disorder, decreasing dose given high Lamictal level  Continue Latuda 20 mg daily but changed to supper time to avoid side effect of drowsiness  Continue Remeron 15 mg at bedtime to help with depression also will help with sleep and appetite  Continue Keppra 1500 mg twice daily for history of seizures  Tapered down Luvox from 150 to 100 mg for 2 days then 50 mg for 2 days then discontinue  In 2 days recommend to start Lexapro 10 mg daily for 2 days to titrate to 20 mg daily to help for depression and long-term treatment of anxiety given patient's reported history of better response to Lexapro than Luvox.  Continue Atarax 25 mg as needed for anxiety  --  The risks/benefits/side-effects/alternatives to this medication were discussed in detail with the patient and time was given for questions. The patient consents to medication trial.    -- Metabolic profile and EKG monitoring obtained while on an atypical antipsychotic (BMI: Lipid Panel: HbgA1c: QTc:)      3. Pertinent labs: CMP no significant abnormalities noted, CBC no significant abnormalities noted, lamotrigine level 4/3 30.5 Keppra level elevated at 48.2 EKG/2 QTc 451, normal sinus rhythm    Lab ordered: TSH, hemoglobin A1c, fasting lipid panel   4. Tobacco Use Disorder  -- Nicotine patch 14mg /24 hours ordered  -- Smoking cessation encouraged  5. Group and Therapy: -- Encouraged patient to participate in unit milieu and in scheduled group therapies       -- Short Term Goals: Ability to identify  changes in lifestyle to reduce recurrence of condition will improve, Ability to verbalize feelings will improve, Ability to disclose and discuss suicidal ideas, and Ability to demonstrate self-control will improve  -- Long Term Goals: Improvement in symptoms so as ready for discharge  6. Discharge Planning:   -- Social work and case management to assist with discharge planning and identification of hospital follow-up needs prior to discharge  -- Estimated LOS: 5-7 days  -- Discharge Concerns: Need to establish a safety plan; Medication compliance and effectiveness  -- Discharge Goals: Return home with outpatient referrals for mental health follow-up including  medication management/psychotherapy      Total Time Spent in Direct Patient Care:  I personally spent 45 minutes on the unit in direct patient care. The direct patient care time included face-to-face time with the patient, reviewing the patient's chart, communicating with other professionals, and coordinating care. Greater than 50% of this time was spent in counseling or coordinating care with the patient regarding goals of hospitalization, psycho-education, and discharge planning needs.   Shiah Berhow Abbott Pao, MD 12/08/2023, 1:03 PM

## 2023-12-09 DIAGNOSIS — F4310 Post-traumatic stress disorder, unspecified: Secondary | ICD-10-CM | POA: Diagnosis not present

## 2023-12-09 DIAGNOSIS — F314 Bipolar disorder, current episode depressed, severe, without psychotic features: Secondary | ICD-10-CM | POA: Diagnosis not present

## 2023-12-09 LAB — LIPID PANEL
Cholesterol: 167 mg/dL (ref 0–200)
HDL: 47 mg/dL (ref >40)
LDL Cholesterol: 87 mg/dL (ref 0–99)
Total CHOL/HDL Ratio: 3.6 ratio
Triglycerides: 167 mg/dL — ABNORMAL HIGH (ref <150)
VLDL: 33 mg/dL (ref 0–40)

## 2023-12-09 LAB — TSH: TSH: 4.768 u[IU]/mL — ABNORMAL HIGH (ref 0.350–4.500)

## 2023-12-09 MED ORDER — QUETIAPINE FUMARATE 50 MG PO TABS
50.0000 mg | ORAL_TABLET | Freq: Every day | ORAL | Status: DC
  Administered 2023-12-09 – 2023-12-10 (×2): 50 mg via ORAL
  Filled 2023-12-09 (×5): qty 1

## 2023-12-09 NOTE — B[facility] Group Notes (Signed)
 Adult Psychoeducational Group Note  Date:  12/09/2023 Time:  3:48 PM  Group Topic/Focus:  Goals Group:   The focus of this group is to help patients establish daily goals to achieve during treatment and discuss how the patient can incorporate goal setting into their daily lives to aide in recovery.  Participation Level:  Active  Participation Quality:  Appropriate  Affect:  Appropriate  Cognitive:  Alert  Insight: Appropriate  Engagement in Group:  Engaged  Modes of Intervention:  Orientation  Additional Comments:  Pt goal for today is to stay awake, go to groups and work on her discharge plan   Dellia Nims 12/09/2023, 3:48 PM

## 2023-12-09 NOTE — BH Assessment (Signed)
 Patient alert and cooperative this evening. Attended group and had snack with other patients. Took medications without problems or complaints. Denies SI/HI and AVH. States she is "over all that". Verbally contracted for saftey

## 2023-12-09 NOTE — Plan of Care (Signed)
   Problem: Education: Goal: Knowledge of [facility] Education information/materials will improve Outcome: Progressing Goal: Emotional status will improve Outcome: Progressing Goal: Mental status will improve Outcome: Progressing

## 2023-12-09 NOTE — Progress Notes (Signed)
 [facility] MD Progress Note  12/09/2023 5:32 PM Madeline Shaw  MRN:  [id number]   Reason for Admission:  Madeline Shaw is a 48 y.o., female with history of type I bipolar disorder, PTSD, generalized anxiety disorder, and seizure disorder admitted voluntarily to behavioral health hospital on 12/06/2023 for management of longstanding HI towards ex, worsening depression and poorly controlled bipolar disorder.  Last psychiatric admission within 2019 for depression and suicidal ideation. The patient is currently on Hospital Day 3.   Chart Review from last 24 hours:  The patient's chart was reviewed and nursing notes were reviewed. The patient's case was discussed in multidisciplinary team meeting. Per [facility] patient is compliant with her medications on the unit, no as needed medication needed or given for anxiety or agitation.  Patient's BP is stable today. Information Obtained Today During Patient Interview: The patient was seen and evaluated on the unit. Patient reports poor sleep with prazosin and mirtazapine and states she has tried Seroquel in the past and it has helped with her anxiety, insomnia however she had weight gain. Discussed r/b/a of replacing Latuda and mirtazapine which she reports she has not had any benefit from with Seroquel and she is agreeable. She is tolerating crosstaper of Luvox to Lexapro and denies side effects. Patient reports high anxiety and states she did not sleep well.  She reports decreased appetite. Reports feeling depresed and anxious. She she denies passive thoughts of death or active SI intention or plan. Patient denies HI today towards ex husband but states "just thoughts of hurting him but I would never do anything."  She denies AVH denies paranoia or other delusions, does not present responding to stimuli or psychotic.   She denies symptoms consistent with mania or hypomania.  Patient took hydroxyzine around 930 AM This morning for anxiety. Sleep  poor interrupted sleep  reported with nightmares  Principal Problem: Bipolar affective disorder, depressed, severe (HCC) Diagnosis: Principal Problem:   Bipolar affective disorder, depressed, severe (HCC) Active Problems:   Suicidal ideation   Seizure (HCC)   PTSD (post-traumatic stress disorder)    Past Psychiatric History: See H&P  Past Medical History:  Past Medical History:  Diagnosis Date   Anxiety    Cancer (HCC)    Depression    Gall stones    Seizures (HCC)    Stroke (HCC)    Vertigo     Past Surgical History:  Procedure Laterality Date   ABDOMINAL HYSTERECTOMY     Family History: History reviewed. No pertinent family history. Family Psychiatric  History: See H&P Social History: See H&P  Current Medications: Current Facility-Administered Medications  Medication Dose Route Frequency Provider Last Rate Last Admin   albuterol (PROVENTIL) (2.5 MG/3ML) 0.083% nebulizer solution 2.5 mg  2.5 mg Nebulization Q2H PRN Howie Ill, NP       alum & mag hydroxide-simeth (MAALOX/MYLANTA) 200-200-20 MG/5ML suspension 30 mL  30 mL Oral Q4H PRN Howie Ill, NP       [START ON 12/10/2023] escitalopram (LEXAPRO) tablet 10 mg  10 mg Oral Daily Attiah, Nadir, MD       Followed by   Melene Muller ON 12/12/2023] escitalopram (LEXAPRO) tablet 20 mg  20 mg Oral Daily Attiah, Nadir, MD       feeding supplement (ENSURE ENLIVE / ENSURE PLUS) liquid 237 mL  237 mL Oral BID BM Attiah, Nadir, MD   237 mL at 12/09/23 1500   fluvoxaMINE (LUVOX) tablet 100 mg  100 mg Oral QHS Abbott Pao,  Nadir, MD   100 mg at 12/08/23 2034   Followed by   Melene Muller ON 12/10/2023] fluvoxaMINE (LUVOX) tablet 50 mg  50 mg Oral QHS Attiah, Nadir, MD       gabapentin (NEURONTIN) capsule 300 mg  300 mg Oral TID Park Pope, MD   300 mg at 12/09/23 1639   haloperidol (HALDOL) tablet 5 mg  5 mg Oral Q8H PRN Gaylyn Rong C, NP   5 mg at 12/09/23 1639   hydrOXYzine (ATARAX) tablet 50 mg  50 mg Oral TID PRN Howie Ill, NP   50 mg at 12/09/23 0945    ibuprofen (ADVIL) tablet 400 mg  400 mg Oral Q6H PRN Gaylyn Rong C, NP   400 mg at 12/09/23 0945   lamoTRIgine (LAMICTAL XR) 24 hour tablet 200 mg  200 mg Oral Daily Attiah, Nadir, MD   200 mg at 12/09/23 1047   levETIRAcetam (KEPPRA) tablet 1,500 mg  1,500 mg Oral BID Abbott Pao, Nadir, MD   1,500 mg at 12/09/23 1639   lurasidone (LATUDA) tablet 20 mg  20 mg Oral Q breakfast Park Pope, MD   20 mg at 12/09/23 0758   magnesium hydroxide (MILK OF MAGNESIA) suspension 30 mL  30 mL Oral Daily PRN Howie Ill, NP       mirtazapine (REMERON) tablet 15 mg  15 mg Oral Seabron Spates, MD   15 mg at 12/08/23 2034   nicotine (NICODERM CQ - dosed in mg/24 hours) patch 14 mg  14 mg Transdermal Daily Bobbitt, Shalon E, NP   14 mg at 12/09/23 0960    Lab Results:  Results for orders placed or performed during the hospital encounter of 12/06/23 (from the past 48 hours)  Glucose, capillary     Status: Abnormal   Collection Time: 12/08/23  1:18 PM  Result Value Ref Range   Glucose-Capillary 114 (H) 70 - 99 mg/dL    Comment: Glucose reference range applies only to samples taken after fasting for at least 8 hours.    Blood Alcohol level:  Lab Results  Component Value Date   ETH <10 11/30/2023   ETH <10 03/26/2023    Metabolic Disorder Labs: Lab Results  Component Value Date   HGBA1C 4.9 11/22/2023   MPG 93.93 11/22/2023   MPG 93.93 11/21/2023   No results found for: "PROLACTIN" Lab Results  Component Value Date   CHOL 160 11/22/2023   TRIG 151 (H) 11/22/2023   HDL 34 (L) 11/22/2023   CHOLHDL 4.7 11/22/2023   VLDL 30 11/22/2023   LDLCALC 96 11/22/2023   LDLCALC 95 11/21/2023    Physical Findings: AIMS: Facial and Oral Movements Muscles of Facial Expression: None Lips and Perioral Area: None Jaw: None Tongue: None,Extremity Movements Upper (arms, wrists, hands, fingers): None Lower (legs, knees, ankles, toes): None, Trunk Movements Neck, shoulders, hips: None, Global  Judgements Severity of abnormal movements overall : None Incapacitation due to abnormal movements: None Patient's awareness of abnormal movements: No Awareness, Dental Status Current problems with teeth and/or dentures?: No Does patient usually wear dentures?: No Edentia?: No  CIWA:    COWS:     Musculoskeletal: Strength & Muscle Tone: within normal limits Gait & Station: normal Patient leans: N/A  Psychiatric Specialty Exam:  General Appearance: Casually dressed and groomed, using walker steadily, appears older than stated age  Behavior: Guarded mildly but pleasant and calm in general  Psychomotor Activity:No psychomotor agitation but moderate retardation noted  Eye Contact: Limited Speech: Decreased  amount, decreased tone and volume   Mood: Moderately dysphoric Affect: Restricted to flat sad affect  Thought Process: Linear and goal-directed Descriptions of Associations: Intact concrete Thought Content: Hallucinations: Denies AH, VH, does not appear responding to stimuli Delusions: No paranoia or other delusions noted Suicidal Thoughts: Denies passive or active SI, intention, plan  Homicidal Thoughts: Reports vague HI, but denies specific intention, plan   Alertness/Orientation: Alert and oriented  Insight: Limited Judgment: Limited  Memory: Limited  Executive Functions  Concentration: Fair Attention Span: Fair Recall: [employer] of Knowledge: Fair   Assets  Assets: Manufacturing systems engineer; Desire for Improvement; Leisure Time    Physical Exam: Physical Exam ROS Blood pressure 112/76, pulse 98, temperature (!) 97.5 F (36.4 C), temperature source Oral, resp. rate 16, height 5\' 2"  (1.575 m), weight 45.4 kg, SpO2 99%. Body mass index is 18.29 kg/m.   Treatment Plan Summary: Daily contact with patient to assess and evaluate symptoms and progress in treatment and Medication management  ASSESSMENT:  Diagnoses / Active Problems: Principal Problem:  Bipolar affective disorder, depressed, severe (HCC) Diagnosis: Principal Problem:   Bipolar affective disorder, depressed, severe (HCC) Active Problems:   Suicidal ideation   Seizure (HCC)   PTSD (post-traumatic stress disorder)   PLAN: Safety and Monitoring:  -- Voluntary admission to inpatient psychiatric unit for safety, stabilization and treatment  -- Daily contact with patient to assess and evaluate symptoms and progress in treatment  -- Patient's case to be discussed in multi-disciplinary team meeting  -- Observation Level : q15 minute checks  -- Vital signs:  q12 hours  -- Precautions: suicide, elopement, and assault  2. Medications:   Continue gabapentin 300 mg 3 times daily for chronic pain, also will help with mood and anxiety  Decreased Lamictal XR from 250 to 200 mg mg daily for mood and depression, also for seizure disorder, decreasing dose given high Lamictal level  Stop  Latuda 20 mg daily but changed to supper time to avoid side effect of drowsiness  Stop  Remeron 15 mg at bedtime to help with depression also will help with sleep and appetite Start Seroquel 50 mg at bedtime for benefit as augmenting agent, for anxiety, insomnia and Benefit with potential PTSD symptoms    Continue Keppra 1500 mg twice daily for history of seizures  Tapered down Luvox from 150 to 100 mg for 2 days then 50 mg for 2 days then discontinue  In 2 days recommend to start Lexapro 10 mg daily for 2 days to titrate to 20 mg daily to help for depression and long-term treatment of anxiety given patient's reported history of better response to Lexapro than Luvox.  Continue Atarax 25 mg as needed for anxiety  --  The risks/benefits/side-effects/alternatives to this medication were discussed in detail with the patient and time was given for questions. The patient consents to medication trial.    -- Metabolic profile and EKG monitoring obtained while on an atypical antipsychotic (BMI: Lipid Panel:  HbgA1c: QTc:)      3. Pertinent labs: CMP no significant abnormalities noted, CBC no significant abnormalities noted, lamotrigine level 4/3 30.5 Keppra level elevated at 48.2 EKG/2 QTc 451, normal sinus rhythm    Lab ordered: TSH, hemoglobin A1c, fasting lipid panel   4. Tobacco Use Disorder  -- Nicotine patch 14mg /24 hours ordered  -- Smoking cessation encouraged  5. Group and Therapy: -- Encouraged patient to participate in unit milieu and in scheduled group therapies       --  Short Term Goals: Ability to identify changes in lifestyle to reduce recurrence of condition will improve, Ability to verbalize feelings will improve, Ability to disclose and discuss suicidal ideas, and Ability to demonstrate self-control will improve  -- Long Term Goals: Improvement in symptoms so as ready for discharge  6. Discharge Planning:   -- Social work and case management to assist with discharge planning and identification of hospital follow-up needs prior to discharge  -- Estimated LOS: 5-7 days  -- Discharge Concerns: Need to establish a safety plan; Medication compliance and effectiveness  -- Discharge Goals: Return home with outpatient referrals for mental health follow-up including medication management/psychotherapy      Total Time Spent in Direct Patient Care:  I personally spent 45 minutes on the unit in direct patient care. The direct patient care time included face-to-face time with the patient, reviewing the patient's chart, communicating with other professionals, and coordinating care. Greater than 50% of this time was spent in counseling or coordinating care with the patient regarding goals of hospitalization, psycho-education, and discharge planning needs.   Miguel Rota, MD 12/09/2023, 5:32 PM

## 2023-12-09 NOTE — Group Note (Signed)
 Date:  12/09/2023 Time:  8:59 PM  Group Topic/Focus:  Wrap-Up Group:   The focus of this group is to help patients review their daily goal of treatment and discuss progress on daily workbooks.    Participation Level:  Active  Participation Quality:  Appropriate  Affect:  Appropriate  Cognitive:  Appropriate  Insight: Appropriate  Engagement in Group:  Engaged  Modes of Intervention:  Education and Exploration  Additional Comments:  Patient attended and participated in group tonight.  She reports going outside with the group.  Learn about other people.  Madeline Shaw [facility] 12/09/2023, 8:59 PM

## 2023-12-09 NOTE — Progress Notes (Signed)
 Collateral contact - Shalayne Leach (son) [phone]  Per patient's request, CSW asked son if he has a phone number for [name] (patient's friend).  He said that he doesn't have her phone number.  He communicates with her via [employer], and will share CSW's phone number with her.  He said that Maggie won't be able to help either; Maggie's husband is unaware of some of the things that Seward Grater has been doing for patient.     Conversation with patient:  CSW provided patient with a list of shelter resources.  Patient said that she will go to a hotel at discharge.   Aahana Elza, LCSWA 12/09/2023

## 2023-12-09 NOTE — Progress Notes (Signed)
 Pt requesting something else to help her sleep, pt informed it was after 2 am , and we do not typically give sleep medication after 2 am

## 2023-12-09 NOTE — Progress Notes (Signed)
   12/09/23 0900  Psych Admission Type (Psych Patients Only)  Admission Status Voluntary  Psychosocial Assessment  Patient Complaints Anxiety;Depression  Eye Contact Fair  Facial Expression Animated  Affect Appropriate to circumstance  Speech Logical/coherent  Interaction Assertive  Motor Activity Slow  Appearance/Hygiene Unremarkable  Behavior Characteristics Cooperative  Mood Pleasant  Thought Process  Coherency WDL  Content WDL  Delusions WDL  Perception WDL  Hallucination None reported or observed  Judgment Impaired  Confusion None  Danger to Self  Current suicidal ideation? Denies  Danger to Others  Danger to Others None reported or observed   Dar Note: Patient presents with anxious affect and mood.  Denies suicidal thoughts, auditory and visual hallucinations.  Medications given as prescribed.  Ambulatory on the unit with steady gait.  Patient attended group and participated.  Patient is safe on and off the unit.

## 2023-12-09 NOTE — Plan of Care (Signed)
   Problem: Education: Goal: Emotional status will improve Outcome: Not Progressing Goal: Mental status will improve Outcome: Not Progressing

## 2023-12-10 DIAGNOSIS — F314 Bipolar disorder, current episode depressed, severe, without psychotic features: Secondary | ICD-10-CM | POA: Diagnosis not present

## 2023-12-10 DIAGNOSIS — F4310 Post-traumatic stress disorder, unspecified: Secondary | ICD-10-CM | POA: Diagnosis not present

## 2023-12-10 LAB — HEMOGLOBIN A1C
Hgb A1c MFr Bld: 5 % (ref 4.8–5.6)
Mean Plasma Glucose: 97 mg/dL

## 2023-12-10 MED ORDER — QUETIAPINE FUMARATE 25 MG PO TABS
25.0000 mg | ORAL_TABLET | ORAL | Status: DC | PRN
  Administered 2023-12-10 – 2023-12-11 (×3): 25 mg via ORAL
  Filled 2023-12-10 (×3): qty 1

## 2023-12-10 NOTE — Progress Notes (Addendum)
 Collateral contact - [facility] - Clinical Care Manager [facility] Medicaid [phone]  Patient signed the ROI.  Clinical Care Manager asked for patient's phone number; they check on patients after discharge.  Patient said that when she gets a new phone, she will call Clinical Care Manager.     Conversation with patient:  Patient said that her friend, who lives in a hotel, is caring for her dog.  Patient said that friend will be at work when she will be discharged, but will go to a hotel and get her dog.  When CSW asked if she could speak with her friend, she said, "I don't know, I'm not sure."  Patient said that she hasn't called any shelters because other patients were using phones.  She said "I will figure it out."  Patient wants to go to a hotel using the bus line.    CSW said that she would be discharged to [facility]) if we can't confirm where she would go.  Patient didn't prefer this plan.    Patient asked to remove her son, Marshell Rieger, from the list; she didn't provide the reason.   Graylon Amory, LCSWA 12/10/2023

## 2023-12-10 NOTE — Progress Notes (Signed)
 [facility] MD Progress Note  12/10/2023 12:32 PM Madeline Shaw  MRN:  [id number]   Reason for Admission:  Madeline Shaw is a 48 y.o., female with history of type I bipolar disorder, PTSD, generalized anxiety disorder, and seizure disorder admitted voluntarily to behavioral health hospital on 12/06/2023 for management of longstanding HI towards ex, worsening depression and poorly controlled bipolar disorder.  Last psychiatric admission within 2019 for depression and suicidal ideation. The patient is currently on Hospital Day 4.   Chart Review from last 24 hours:  The patient's chart was reviewed and nursing notes were reviewed. The patient's case was discussed in multidisciplinary team meeting. Per [facility] patient is compliant with her medications on the unit, no as needed medication needed or given for anxiety or agitation.  Patient's BP is stable today.   Information Obtained Today During Patient Interview: The patient was seen and evaluated on the unit. Reports she slept well last night with Seroquel and would like to try lower doses during the day as she has been requesting PRN Haldol which she states relieves her anxiety and keeps her from picking at her face. Discussed addition of PRN Seroquel doses. She is tolerating crosstaper of Luvox to Lexapro and denies side effects.  Reports feeling depresed and anxious. She she denies passive thoughts of death or active SI intention or plan. Patient denies HI today or thoughts of harming her ex-husband.  Patient participating in groups and overall reports improvement in mood and appetite. She denies AVH denies paranoia or other delusions, does not present responding to stimuli or psychotic.   She denies symptoms consistent with mania or hypomania.   Sleep  Improved sleep and denies nightmares  Principal Problem: Bipolar affective disorder, depressed, severe (HCC) Diagnosis: Principal Problem:   Bipolar affective disorder, depressed, severe (HCC) Active  Problems:   Suicidal ideation   Seizure (HCC)   PTSD (post-traumatic stress disorder)    Past Psychiatric History: See H&P  Past Medical History:  Past Medical History:  Diagnosis Date   Anxiety    Cancer (HCC)    Depression    Gall stones    Seizures (HCC)    Stroke (HCC)    Vertigo     Past Surgical History:  Procedure Laterality Date   ABDOMINAL HYSTERECTOMY     Family History: History reviewed. No pertinent family history. Family Psychiatric  History: See H&P Social History: See H&P  Current Medications: Current Facility-Administered Medications  Medication Dose Route Frequency Provider Last Rate Last Admin   albuterol (PROVENTIL) (2.5 MG/3ML) 0.083% nebulizer solution 2.5 mg  2.5 mg Nebulization Q2H PRN Howie Ill, NP       alum & mag hydroxide-simeth (MAALOX/MYLANTA) 200-200-20 MG/5ML suspension 30 mL  30 mL Oral Q4H PRN Gaylyn Rong C, NP       escitalopram (LEXAPRO) tablet 10 mg  10 mg Oral Daily Attiah, Nadir, MD   10 mg at 12/10/23 0810   Followed by   Melene Muller ON 12/12/2023] escitalopram (LEXAPRO) tablet 20 mg  20 mg Oral Daily Attiah, Nadir, MD       feeding supplement (ENSURE ENLIVE / ENSURE PLUS) liquid 237 mL  237 mL Oral BID BM Attiah, Nadir, MD   237 mL at 12/10/23 0852   fluvoxaMINE (LUVOX) tablet 50 mg  50 mg Oral QHS Attiah, Nadir, MD       gabapentin (NEURONTIN) capsule 300 mg  300 mg Oral TID Park Pope, MD   300 mg at 12/10/23 1128  haloperidol (HALDOL) tablet 5 mg  5 mg Oral Q8H PRN Gaylyn Rong C, NP   5 mg at 12/09/23 1639   hydrOXYzine (ATARAX) tablet 50 mg  50 mg Oral TID PRN Howie Ill, NP   50 mg at 12/10/23 1610   ibuprofen (ADVIL) tablet 400 mg  400 mg Oral Q6H PRN Howie Ill, NP   400 mg at 12/09/23 0945   lamoTRIgine (LAMICTAL XR) 24 hour tablet 200 mg  200 mg Oral Daily Attiah, Nadir, MD   200 mg at 12/10/23 0809   levETIRAcetam (KEPPRA) tablet 1,500 mg  1,500 mg Oral BID Abbott Pao, Nadir, MD   1,500 mg at 12/10/23 0809    magnesium hydroxide (MILK OF MAGNESIA) suspension 30 mL  30 mL Oral Daily PRN Howie Ill, NP       nicotine (NICODERM CQ - dosed in mg/24 hours) patch 14 mg  14 mg Transdermal Daily Bobbitt, Shalon E, NP   14 mg at 12/10/23 9604   QUEtiapine (SEROQUEL) tablet 50 mg  50 mg Oral QHS Kirstyn Lean, Lovett Sox, MD   50 mg at 12/09/23 2021    Lab Results:  Results for orders placed or performed during the hospital encounter of 12/06/23 (from the past 48 hours)  Glucose, capillary     Status: Abnormal   Collection Time: 12/08/23  1:18 PM  Result Value Ref Range   Glucose-Capillary 114 (H) 70 - 99 mg/dL    Comment: Glucose reference range applies only to samples taken after fasting for at least 8 hours.  Lipid panel     Status: Abnormal   Collection Time: 12/09/23  6:37 PM  Result Value Ref Range   Cholesterol 167 0 - 200 mg/dL   Triglycerides 540 (H) <150 mg/dL   HDL 47 >98 mg/dL   Total CHOL/HDL Ratio 3.6 RATIO   VLDL 33 0 - 40 mg/dL   LDL Cholesterol 87 0 - 99 mg/dL    Comment:        Total Cholesterol/HDL:CHD Risk Coronary Heart Disease Risk Table                     Men   Women  1/2 Average Risk   3.4   3.3  Average Risk       5.0   4.4  2 X Average Risk   9.6   7.1  3 X Average Risk  23.4   11.0        Use the calculated Patient Ratio above and the CHD Risk Table to determine the patient's CHD Risk.        ATP III CLASSIFICATION (LDL):  <100     mg/dL   Optimal  119-147  mg/dL   Near or Above                    Optimal  130-159  mg/dL   Borderline  829-562  mg/dL   High  >130     mg/dL   Very High Performed at [facility], [address] [place], [place] [address]   TSH     Status: Abnormal   Collection Time: 12/09/23  6:37 PM  Result Value Ref Range   TSH 4.768 (H) 0.350 - 4.500 uIU/mL    Comment: Performed by a 3rd Generation assay with a functional sensitivity of <=0.01 uIU/mL. Performed at [facility], [address]  [place], [place] [address]     Blood Alcohol  level:  Lab Results  Component Value Date   ETH <10 11/30/2023   ETH <10 03/26/2023    Metabolic Disorder Labs: Lab Results  Component Value Date   HGBA1C 4.9 11/22/2023   MPG 93.93 11/22/2023   MPG 93.93 11/21/2023   No results found for: "PROLACTIN" Lab Results  Component Value Date   CHOL 167 12/09/2023   TRIG 167 (H) 12/09/2023   HDL 47 12/09/2023   CHOLHDL 3.6 12/09/2023   VLDL 33 12/09/2023   LDLCALC 87 12/09/2023   LDLCALC 96 11/22/2023    Physical Findings: AIMS: Facial and Oral Movements Muscles of Facial Expression: None Lips and Perioral Area: None Jaw: None Tongue: None,Extremity Movements Upper (arms, wrists, hands, fingers): None Lower (legs, knees, ankles, toes): None, Trunk Movements Neck, shoulders, hips: None, Global Judgements Severity of abnormal movements overall : None Incapacitation due to abnormal movements: None Patient's awareness of abnormal movements: No Awareness, Dental Status Current problems with teeth and/or dentures?: No Does patient usually wear dentures?: No Edentia?: No  CIWA:    COWS:     Musculoskeletal: Strength & Muscle Tone: within normal limits Gait & Station: normal Patient leans: N/A  Psychiatric Specialty Exam:  General Appearance: Casually dressed and groomed, using walker steadily, appears older than stated age  Behavior: Guarded mildly but pleasant and calm in general  Psychomotor Activity:No psychomotor agitation but moderate retardation noted  Eye Contact: Limited Speech: Decreased amount, decreased tone and volume   Mood: Moderately dysphoric Affect: Restricted to flat sad affect  Thought Process: Linear and goal-directed Descriptions of Associations: Intact concrete Thought Content: Hallucinations: Denies AH, VH, does not appear responding to stimuli Delusions: No paranoia or other delusions noted Suicidal Thoughts: Denies passive or active SI,  intention, plan  Homicidal Thoughts: Reports vague HI, but denies specific intention, plan   Alertness/Orientation: Alert and oriented  Insight: Limited Judgment: Limited  Memory: Limited  Executive Functions  Concentration: Fair Attention Span: Fair Recall: Madeline Shaw of Knowledge: Fair   Assets  Assets: Manufacturing systems engineer; Desire for Improvement; Leisure Time    Physical Exam: Physical Exam ROS Blood pressure (!) 141/87, pulse 78, temperature 97.7 F (36.5 C), temperature source Oral, resp. rate 16, height 5\' 2"  (1.575 m), weight 45.4 kg, SpO2 100%. Body mass index is 18.29 kg/m.   Treatment Plan Summary: Daily contact with patient to assess and evaluate symptoms and progress in treatment and Medication management  ASSESSMENT:  Diagnoses / Active Problems: Principal Problem: Bipolar affective disorder, depressed, severe (HCC) Diagnosis: Principal Problem:   Bipolar affective disorder, depressed, severe (HCC) Active Problems:   Suicidal ideation   Seizure (HCC)   PTSD (post-traumatic stress disorder)   PLAN: Safety and Monitoring:  -- Voluntary admission to inpatient psychiatric unit for safety, stabilization and treatment  -- Daily contact with patient to assess and evaluate symptoms and progress in treatment  -- Patient's case to be discussed in multi-disciplinary team meeting  -- Observation Level : q15 minute checks  -- Vital signs:  q12 hours  -- Precautions: suicide, elopement, and assault  2. Medications:   Continue gabapentin 300 mg 3 times daily for chronic pain, also will help with mood and anxiety  cont Lamictal XR 200 mg mg daily for mood and depression, also for seizure disorder, decreased dose given high Lamictal level  Stopped  Latuda 20 mg daily but changed to supper time to avoid side effect of drowsiness  Stopped  Remeron 15 mg at bedtime to help with depression also will  help with sleep and appetite Cont Seroquel 50 mg at bedtime for  benefit as augmenting agent, for anxiety, insomnia and Benefit with potential PTSD symptoms -start Seroquel 25 mg q6h-PRN for anxiety (hold for SBP<90 or DBP<50)    Continue Keppra 1500 mg twice daily for history of seizures  Tapered down Luvox from 150 to 100 mg for 2 days then 50 mg for 2 days then discontinue  In 2 days recommend to start Lexapro 10 mg daily for 2 days to titrate to 20 mg daily to help for depression and long-term treatment of anxiety given patient's reported history of better response to Lexapro than Luvox.  Continue Atarax 25 mg as needed for anxiety  --  The risks/benefits/side-effects/alternatives to this medication were discussed in detail with the patient and time was given for questions. The patient consents to medication trial.    -- Metabolic profile and EKG monitoring obtained while on an atypical antipsychotic (BMI: Lipid Panel: HbgA1c: QTc:)      3. Pertinent labs: CMP no significant abnormalities noted, CBC no significant abnormalities noted, lamotrigine level 4/3 30.5 Keppra level elevated at 48.2 EKG/2 QTc 451, normal sinus rhythm    Lab ordered: TSH, hemoglobin A1c, fasting lipid panel   4. Tobacco Use Disorder  -- Nicotine patch 14mg /24 hours ordered  -- Smoking cessation encouraged  5. Group and Therapy: -- Encouraged patient to participate in unit milieu and in scheduled group therapies       -- Short Term Goals: Ability to identify changes in lifestyle to reduce recurrence of condition will improve, Ability to verbalize feelings will improve, Ability to disclose and discuss suicidal ideas, and Ability to demonstrate self-control will improve  -- Long Term Goals: Improvement in symptoms so as ready for discharge  6. Discharge Planning:   -- Social work and case management to assist with discharge planning and identification of hospital follow-up needs prior to discharge  -- Estimated LOS: 5-7 days  -- Discharge Concerns: discharge tomorrow  --  Discharge Goals: Return home with outpatient referrals for mental health follow-up including medication management/psychotherapy      Total Time Spent in Direct Patient Care:  I personally spent 45 minutes on the unit in direct patient care. The direct patient care time included face-to-face time with the patient, reviewing the patient's chart, communicating with other professionals, and coordinating care. Greater than 50% of this time was spent in counseling or coordinating care with the patient regarding goals of hospitalization, psycho-education, and discharge planning needs.   Miguel Rota, MD 12/10/2023, 12:32 PM

## 2023-12-10 NOTE — Progress Notes (Signed)
   12/10/23 0917  Psych Admission Type (Psych Patients Only)  Admission Status Voluntary  Psychosocial Assessment  Patient Complaints Anxiety  Eye Contact Fair  Facial Expression Anxious;Worried  Affect Appropriate to circumstance  Surveyor, minerals Activity Slow  Appearance/Hygiene Unremarkable  Behavior Characteristics Cooperative  Mood Anxious;Pleasant  Aggressive Behavior  Effect No apparent injury  Thought Process  Coherency WDL  Content WDL  Delusions None reported or observed  Perception WDL  Hallucination None reported or observed  Judgment WDL  Confusion None  Danger to Self  Current suicidal ideation? Denies  Agreement Not to Harm Self Yes  Description of Agreement Verbal contract  Danger to Others  Danger to Others None reported or observed  Danger to Others Abnormal  Harmful Behavior to others No threats or harm toward other people  Destructive Behavior No threats or harm toward property

## 2023-12-10 NOTE — Plan of Care (Signed)
   Problem: Education: Goal: Knowledge of [facility] Education information/materials will improve Outcome: Progressing Goal: Emotional status will improve Outcome: Progressing Goal: Mental status will improve Outcome: Progressing

## 2023-12-10 NOTE — [facility] Group Notes (Signed)
 [facility] Group Notes:  (Nursing/MHT/Case Management/Adjunct)  Date:  12/10/2023  Time:  2000  Type of Therapy:   wrap up group  Participation Level:  Active  Participation Quality:  Appropriate and Attentive  Affect:  Appropriate  Cognitive:  Alert and Appropriate  Insight:  Appropriate and Good  Engagement in Group:  Engaged  Modes of Intervention:  Discussion  Summary of Progress/Problems: Pt rate there day a 8.  Fay Records 12/10/2023, 9:22 PM

## 2023-12-10 NOTE — Group Note (Signed)
 Recreation Therapy Group Note   Group Topic:Self-Esteem  Group Date: 12/10/2023 Start Time: 1020 End Time: 1050 Facilitators: Yaffa Seckman-McCall, LRT,CTRS Location: [address] Dayroom   Group Topic: Self-Esteem  Goal Area(s) Addresses:  Patient will successfully identify positive attributes about themselves.  Patient will identify healthy ways to increase self-esteem. Patient will acknowledge benefit(s) of improved self-esteem.   Intervention: Worksheet, colored pencils  Activity: Pictures of Me. LRT and patients discussed the meaning of self esteem and its importance in helping create the person you are. LRT then gave patients a worksheet with four blank picture frames. 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 Once those areas were identified, patients were to draw a picture that coincides with the identity identified by patient.   Education:  Self-Esteem, Discharge Planning  Education Outcome: Acknowledges education/In group clarification offered/Needs additional education   Affect/Mood: N/A   Participation Level: Did not attend    Clinical Observations/Individualized Feedback:      Plan: Continue to engage patient in RT group sessions 2-3x/week.   Kaleeyah Cuffie-McCall, LRT,CTRS 12/10/2023 1:51 PM

## 2023-12-10 NOTE — [facility] Group Notes (Signed)
 Adult Psychoeducational Group Note  Date:  12/10/2023 Time:  9:38 AM  Group Topic/Focus:  Goals Group:   The focus of this group is to help patients establish daily goals to achieve during treatment and discuss how the patient can incorporate goal setting into their daily lives to aide in recovery. Orientation:   The focus of this group is to educate the patient on the purpose and policies of crisis stabilization and provide a format to answer questions about their admission.  The group details unit policies and expectations of patients while admitted.  Participation Level:  Did Not Attend  Participation Quality:    Affect:    Cognitive:    Insight:   Engagement in Group:    Modes of Intervention:    Additional Comments:    Sheran Lawless 12/10/2023, 9:38 AM

## 2023-12-11 ENCOUNTER — Other Ambulatory Visit (HOSPITAL_COMMUNITY): Payer: Self-pay

## 2023-12-11 DIAGNOSIS — F4310 Post-traumatic stress disorder, unspecified: Secondary | ICD-10-CM

## 2023-12-11 DIAGNOSIS — F314 Bipolar disorder, current episode depressed, severe, without psychotic features: Secondary | ICD-10-CM | POA: Diagnosis not present

## 2023-12-11 MED ORDER — NICOTINE 14 MG/24HR TD PT24
14.0000 mg | MEDICATED_PATCH | Freq: Every day | TRANSDERMAL | 0 refills | Status: AC
  Filled 2023-12-11: qty 28, 28d supply, fill #0

## 2023-12-11 MED ORDER — LAMOTRIGINE ER 200 MG PO TB24
200.0000 mg | ORAL_TABLET | Freq: Every day | ORAL | 0 refills | Status: AC
  Filled 2023-12-11: qty 30, 30d supply, fill #0

## 2023-12-11 MED ORDER — QUETIAPINE FUMARATE 25 MG PO TABS
25.0000 mg | ORAL_TABLET | Freq: Four times a day (QID) | ORAL | 0 refills | Status: AC
  Filled 2023-12-11: qty 90, 23d supply, fill #0

## 2023-12-11 MED ORDER — GABAPENTIN 300 MG PO CAPS
300.0000 mg | ORAL_CAPSULE | Freq: Three times a day (TID) | ORAL | 0 refills | Status: AC
  Filled 2023-12-11: qty 90, 30d supply, fill #0

## 2023-12-11 MED ORDER — LEVETIRACETAM 750 MG PO TABS
1500.0000 mg | ORAL_TABLET | Freq: Two times a day (BID) | ORAL | 0 refills | Status: AC
  Filled 2023-12-11: qty 120, 30d supply, fill #0

## 2023-12-11 MED ORDER — QUETIAPINE FUMARATE 50 MG PO TABS
50.0000 mg | ORAL_TABLET | Freq: Every day | ORAL | 0 refills | Status: AC
Start: 2023-12-11 — End: ?
  Filled 2023-12-11: qty 30, 30d supply, fill #0

## 2023-12-11 MED ORDER — VENTOLIN HFA 108 (90 BASE) MCG/ACT IN AERS
2.0000 | INHALATION_SPRAY | Freq: Four times a day (QID) | RESPIRATORY_TRACT | 0 refills | Status: AC | PRN
  Filled 2023-12-11: qty 18, 25d supply, fill #0

## 2023-12-11 MED ORDER — ESCITALOPRAM OXALATE 20 MG PO TABS
20.0000 mg | ORAL_TABLET | Freq: Every day | ORAL | 0 refills | Status: AC
  Filled 2023-12-11: qty 30, 30d supply, fill #0

## 2023-12-11 NOTE — Progress Notes (Signed)
  [facility] Adult Case Management Discharge Plan :  Will you be returning to the same living situation after discharge:  Yes,  patient is unhoused  and will return to a hotel At discharge, do you have transportation home?: Yes,  CSW arranged transportation through [facility] for 11 AM  Do you have the ability to pay for your medications: Yes,  patient has insurance.  Patient was given 2 bus passes for medical appointments.  Release of information consent forms completed and in the chart;  Patient's signature needed at discharge.  Patient to Follow up at:  Follow-up Information     [facility]. Go on 12/16/2023.   Why: Please go to this provider on 12/16/23 at 8:30 am for an assessment, to obtain  group therapy and medication management services. Contact information: [address] [place] [address] [phone]                 Next level of care provider has access to [facility] Link:no  Safety Planning and Suicide Prevention discussed: Yes,  with patient  Has patient been referred to the Quitline?: Patient refused referral for treatment.  Patient declined the print-out.  Patient said that she has nicotine patches at home.  Patient has been referred for addiction treatment: No known substance use disorder.  Madeline Shaw, LCSWA 12/11/2023, 9:56 AM
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
## 2023-12-11 NOTE — Plan of Care (Signed)

## 2023-12-11 NOTE — Progress Notes (Signed)
 D: Pt A&O x 4. Denies SI, HI, AVH, and pain at this time. D/c home as ordered. Picked up in lobby by taxi service.   A: D/c instructions reviewed with pt including electronic prescriptions and follow up appointments; compliance encouraged. All belongings from locker 2 given to pt at time of departure. Scheduled and prn meds given with verbal education and effects monitored. Safety checks maintained without incident until time of discharge.   R: Pt receptive to care. Compliant with meds when offered. Denies adverse drug reactions when assessed. Verbalized understanding related to d/c instructions. Signed belongings sheet in agreement with items received from locker. Ambulatory with steady gait. Appears to be in no physical distress at time of departure.

## 2023-12-11 NOTE — [facility] Suicide Risk Assessment (Signed)
 [facility] INPATIENT:  Family/Significant Other Suicide Prevention Education  Suicide Prevention Education:  Education Completed; with patient,  (name of family member/significant other) has been identified by the patient as the family member/significant other with whom the patient will be residing, and identified as the person(s) who will aid the patient in the event of a mental health crisis (suicidal ideations/suicide attempt).  With written consent from the patient, the family member/significant other has been provided the following suicide prevention education, prior to the and/or following the discharge of the patient.  Patient was given "Suicide Prevention Information" brochure.  Patient doesn't have any guns or weapons.  The suicide prevention education provided includes the following: Suicide risk factors Suicide prevention and interventions National Suicide Hotline telephone number [facility] assessment telephone number [facility] Emergency Assistance [address] [facility] and/or Residential Mobile Crisis Unit telephone number  Request made of family/significant other to: Remove weapons (e.g., guns, rifles, knives), all items previously/currently identified as safety concern.   Remove drugs/medications (over-the-counter, prescriptions, illicit drugs), all items previously/currently identified as a safety concern.  The family member/significant other verbalizes understanding of the suicide prevention education information provided.  The family member/significant other agrees to remove the items of safety concern listed above.  Marli Diego O Blayn Whetsell, LCSWA 12/11/2023, 9:54 AM

## 2023-12-11 NOTE — Transportation (Signed)
 12/11/2023  Madeline Shaw DOB: Jun 27, 1976 MRN: [id number]   RIDER WAIVER AND RELEASE OF LIABILITY  For the purposes of helping with transportation needs, [facility] partners with outside transportation providers (taxi companies, [place], Catering manager.) to give [employer] patients or other approved people the choice of on-demand rides [employer] [facility]") to our buildings for non-emergency visits.  By using [employer], I, the person signing this document, on behalf of myself and/or any legal minors (in my care using the [employer]), agree:  Science writer given to me are supplied by independent, outside transportation providers who do not work for, or have any affiliation with, [employer]. [facility] is not a transportation company. [place] has no control over the quality or safety of the rides I get using [employer]. [place] has no control over whether any outside ride will happen on time or not. [facility] gives no guarantee on the reliability, quality, safety, or availability on any rides, or that no mistakes will happen. I know and accept that traveling by vehicle (car, truck, [employer], Zenaida Niece, bus, taxi, etc.) has risks of serious injuries such as disability, being paralyzed, and death. I know and agree the risk of using [employer] is mine alone, and not [employer]. Transport Services are provided "as is" and as are available. The transportation providers are in charge for all inspections and care of the vehicles used to provide these rides. I agree not to take legal action against [facility] [place], its agents, employees, officers, directors, representatives, insurers, attorneys, assigns, successors, subsidiaries, and affiliates at any time for any reasons related directly or indirectly to using [employer]. I also agree not to take legal action against [place] or its affiliates for any injury, death, or damage to property caused by or related to using  [employer]. I have read this Waiver and Release of Liability, and I understand the terms used in it and their legal meaning. This Waiver is freely and voluntarily given with the understanding that my right (or any legal minors) to legal action against [place] relating to [employer] is knowingly given up to use these services.   I attest that I read the Ride Waiver and Release of Liability to Madeline Shaw, gave Ms. Madej the opportunity to ask questions and answered the questions asked (if any). I affirm that Madeline Shaw then provided consent for assistance with transportation.     Kron Everton, LCSWA 12/11/2023

## 2023-12-11 NOTE — Progress Notes (Signed)
   12/10/23 2300  Psych Admission Type (Psych Patients Only)  Admission Status Voluntary  Psychosocial Assessment  Patient Complaints Anxiety;Depression (anxiety 9/10, depression 4/10)  Eye Contact Fair  Facial Expression Anxious  Affect Appropriate to circumstance  Speech Logical/coherent  Interaction Assertive  Motor Activity Slow  Appearance/Hygiene Unremarkable  Behavior Characteristics Cooperative  Mood Pleasant  Thought Process  Coherency WDL  Content WDL  Delusions None reported or observed  Perception WDL  Hallucination None reported or observed  Judgment WDL  Confusion None  Danger to Self  Current suicidal ideation? Denies  Agreement Not to Harm Self Yes  Description of Agreement verbal  Danger to Others  Danger to Others None reported or observed  Danger to Others Abnormal  Harmful Behavior to others No threats or harm toward other people  Destructive Behavior No threats or harm toward property
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
## 2024-03-23 ENCOUNTER — Ambulatory Visit: Admitting: Family Medicine

## 2024-05-07 ENCOUNTER — Other Ambulatory Visit: Payer: Self-pay | Admitting: Family Medicine

## 2024-05-07 DIAGNOSIS — Z1231 Encounter for screening mammogram for malignant neoplasm of breast: Secondary | ICD-10-CM

## 2024-05-12 ENCOUNTER — Inpatient Hospital Stay: Admission: RE | Admit: 2024-05-12 | Source: Ambulatory Visit
